# Patient Record
Sex: Female | Born: 1935
Health system: Southern US, Community
[De-identification: ages and names within clinical notes are randomized; demographics above are authoritative.]

## PROBLEM LIST (undated history)

## (undated) ENCOUNTER — Emergency Department (HOSPITAL_COMMUNITY): Payer: Medicaid Other | Source: Home / Self Care

## (undated) DIAGNOSIS — R296 Repeated falls: Secondary | ICD-10-CM

## (undated) DIAGNOSIS — I10 Essential (primary) hypertension: Secondary | ICD-10-CM

## (undated) DIAGNOSIS — E119 Type 2 diabetes mellitus without complications: Secondary | ICD-10-CM

## (undated) DIAGNOSIS — R945 Abnormal results of liver function studies: Secondary | ICD-10-CM

## (undated) DIAGNOSIS — D696 Thrombocytopenia, unspecified: Secondary | ICD-10-CM

## (undated) DIAGNOSIS — F329 Major depressive disorder, single episode, unspecified: Secondary | ICD-10-CM

## (undated) DIAGNOSIS — F0390 Unspecified dementia without behavioral disturbance: Secondary | ICD-10-CM

## (undated) DIAGNOSIS — F32A Depression, unspecified: Secondary | ICD-10-CM

## (undated) DIAGNOSIS — N390 Urinary tract infection, site not specified: Secondary | ICD-10-CM

## (undated) DIAGNOSIS — I679 Cerebrovascular disease, unspecified: Secondary | ICD-10-CM

## (undated) DIAGNOSIS — R7989 Other specified abnormal findings of blood chemistry: Secondary | ICD-10-CM

## (undated) HISTORY — PX: ABDOMINAL HYSTERECTOMY: SHX81

## (undated) HISTORY — PX: CHOLECYSTECTOMY: SHX55

---

## 2004-06-21 ENCOUNTER — Ambulatory Visit: Payer: Self-pay | Admitting: *Deleted

## 2005-12-05 ENCOUNTER — Ambulatory Visit: Payer: Self-pay | Admitting: Internal Medicine

## 2005-12-12 ENCOUNTER — Ambulatory Visit (HOSPITAL_COMMUNITY): Admission: RE | Admit: 2005-12-12 | Discharge: 2005-12-12 | Payer: Self-pay | Admitting: Internal Medicine

## 2005-12-17 ENCOUNTER — Ambulatory Visit: Payer: Self-pay | Admitting: Internal Medicine

## 2006-06-18 ENCOUNTER — Ambulatory Visit (HOSPITAL_COMMUNITY): Admission: RE | Admit: 2006-06-18 | Discharge: 2006-06-18 | Payer: Self-pay | Admitting: Internal Medicine

## 2006-06-18 ENCOUNTER — Ambulatory Visit: Payer: Self-pay | Admitting: Internal Medicine

## 2006-08-27 ENCOUNTER — Encounter: Payer: Self-pay | Admitting: Internal Medicine

## 2006-08-27 DIAGNOSIS — R7989 Other specified abnormal findings of blood chemistry: Secondary | ICD-10-CM | POA: Insufficient documentation

## 2006-08-27 DIAGNOSIS — E039 Hypothyroidism, unspecified: Secondary | ICD-10-CM

## 2006-08-27 DIAGNOSIS — F411 Generalized anxiety disorder: Secondary | ICD-10-CM | POA: Insufficient documentation

## 2006-08-27 DIAGNOSIS — K589 Irritable bowel syndrome without diarrhea: Secondary | ICD-10-CM | POA: Insufficient documentation

## 2006-08-27 DIAGNOSIS — F329 Major depressive disorder, single episode, unspecified: Secondary | ICD-10-CM

## 2006-08-27 DIAGNOSIS — R197 Diarrhea, unspecified: Secondary | ICD-10-CM | POA: Insufficient documentation

## 2011-06-10 ENCOUNTER — Emergency Department (HOSPITAL_COMMUNITY)
Admission: EM | Admit: 2011-06-10 | Discharge: 2011-06-10 | Disposition: A | Discharge status: Home / Self Care | Payer: Medicare Other | Attending: Emergency Medicine | Admitting: Emergency Medicine

## 2011-06-10 ENCOUNTER — Encounter: Payer: Self-pay | Admitting: Emergency Medicine

## 2011-06-10 DIAGNOSIS — Z9079 Acquired absence of other genital organ(s): Secondary | ICD-10-CM | POA: Insufficient documentation

## 2011-06-10 DIAGNOSIS — E232 Diabetes insipidus: Secondary | ICD-10-CM

## 2011-06-10 HISTORY — DX: Major depressive disorder, single episode, unspecified: F32.9

## 2011-06-10 HISTORY — DX: Unspecified dementia, unspecified severity, without behavioral disturbance, psychotic disturbance, mood disturbance, and anxiety: F03.90

## 2011-06-10 HISTORY — DX: Depression, unspecified: F32.A

## 2011-06-10 LAB — CBC
MCH: 32.8 pg (ref 26.0–34.0)
MCV: 95.3 fL (ref 78.0–100.0)
Platelets: 92 10*3/uL — ABNORMAL LOW (ref 150–400)
RDW: 13.1 % (ref 11.5–15.5)
WBC: 3.4 10*3/uL — ABNORMAL LOW (ref 4.0–10.5)

## 2011-06-10 LAB — COMPREHENSIVE METABOLIC PANEL
Albumin: 3.4 g/dL — ABNORMAL LOW (ref 3.5–5.2)
CO2: 28 mEq/L (ref 19–32)
Calcium: 9.2 mg/dL (ref 8.4–10.5)
Chloride: 102 mEq/L (ref 96–112)
Creatinine, Ser: 0.49 mg/dL — ABNORMAL LOW (ref 0.50–1.10)

## 2011-06-10 MED ORDER — METFORMIN HCL 1000 MG PO TABS: 1000.0000 mg | ORAL_TABLET | Freq: Two times a day (BID) | ORAL | Status: DC

## 2011-06-10 MED ORDER — BACITRACIN ZINC 500 UNIT/GM EX OINT
TOPICAL_OINTMENT | CUTANEOUS | Status: AC
  Administered 2011-06-10: 16:00:00
  Filled 2011-06-10: qty 0.9

## 2011-06-10 NOTE — ED Notes (Signed)
Pt has a wound on her rt lower leg. Pt does not know how long that has been there. Pt also has smaller abrasions on bilateral legs.

## 2011-06-10 NOTE — ED Provider Notes (Addendum)
History     CSN: [id number] Arrival date & time: 06/10/2011  1:02 PM   Chief Complaint  Patient presents with  . wound on rt leg      (Include location/radiation/quality/duration/timing/severity/associated sxs/prior treatment) HPI Elderly white female brought in by her daughter this afternoon because of concern about possible infection in her lower extremity. Patient's daughter noted today that she had some reddened areas on her right shoulder and left shin more prominently on the right which looks like they might be secondary to scratches or a skin infection. She also has concerns that the patient does not followup with a physician and she may be getting unsafe to stay at home. The patient does not want any assisted living and will sustain her home as long as possible. According to the daughter the patient is taking care of her self. She has someone come by and help with her food. She is concerning that she is becoming more and more dependent and less and less active. Patient denies any fever or pain at the present time. Patient currently has no private physician has not been seen by a caregiver in quite some time according to the daughter.  Past Medical History  Diagnosis Date  . Depression   . Dementia      Past Surgical History  Procedure Date  . Abdominal hysterectomy     History reviewed. No pertinent family history.  History  Substance Use Topics  . Smoking status: Not on file  . Smokeless tobacco: Not on file  . Alcohol Use: No    OB History    Grav Para Term Preterm Abortions TAB SAB Ect Mult Living                  Review of Systems  All other systems reviewed and are negative.    Allergies  Review of patient's allergies indicates no known allergies.  Home Medications  No current outpatient prescriptions on file.  Physical Exam    BP 147/58  Pulse 74  Temp(Src) 98.5 F (36.9 C) (Oral)  Resp 17  SpO2 96%  Physical Exam  Constitutional: She  appears well-developed and well-nourished.  HENT:  Head: Atraumatic.  Neck: Normal range of motion. Neck supple.  Cardiovascular: Normal rate and regular rhythm.   Pulmonary/Chest: Effort normal.  Abdominal: Soft. Bowel sounds are normal.  Skin: Skin is warm and dry. Abrasion noted.   HEENT: Normal Neck: Supple with no meningeal signs.     ED Course  Procedures  Results for orders placed during the hospital encounter of 06/10/11  COMPREHENSIVE METABOLIC PANEL      Component Value Range   Sodium 136  135 - 145 (mEq/L)   Potassium 4.0  3.5 - 5.1 (mEq/L)   Chloride 102  96 - 112 (mEq/L)   CO2 28  19 - 32 (mEq/L)   Glucose, Bld 269 (*) 70 - 99 (mg/dL)   BUN 9  6 - 23 (mg/dL)   Creatinine, Ser 1.19 (*) 0.50 - 1.10 (mg/dL)   Calcium 9.2  8.4 - 14.7 (mg/dL)   Total Protein 6.2  6.0 - 8.3 (g/dL)   Albumin 3.4 (*) 3.5 - 5.2 (g/dL)   AST 27  0 - 37 (U/L)   ALT 18  0 - 35 (U/L)   Alkaline Phosphatase 91  39 - 117 (U/L)   Total Bilirubin 1.1  0.3 - 1.2 (mg/dL)   GFR calc non Af Amer >60  >60 (mL/min)   GFR calc Af  Amer >60  >60 (mL/min)   No results found.   No diagnosis found.   MDM        Nelia Shi, MD 06/10/11 1612  Nelia Shi, MD 07/15/11 2312

## 2011-06-10 NOTE — ED Notes (Signed)
Social workers number provided for further resources.  Dr Radford Pax  Explained to POA home health visit set up to check on pt in her home. Metformin rx provided. POA instructed in care of diabetes and resources provided to obtain more information in regards to this disease process.

## 2011-06-10 NOTE — ED Notes (Signed)
Pt lives alone, daughter is concerned for elderly parent's health, safety and general well being.  States "mom has not seen a doctor in years and throws a fit when insisted she sees one" . Dr Radford Pax at bedside during discussion of long term needs for "mom".  Wound on rt leg requires basic wound care. Wound appears to be an abrasion with skin tear/laceration in center, no drainage or bleeding noted. Pt has what appears to be open bug bites on both extremities.  No swelling/edema in lower extremities.

## 2011-06-10 NOTE — ED Notes (Signed)
New contact  Phone #  For daughter is Work # [phone]  Cell # [phone].  [name] POA

## 2011-07-02 ENCOUNTER — Emergency Department (HOSPITAL_COMMUNITY): Payer: Medicare Other

## 2011-07-02 ENCOUNTER — Inpatient Hospital Stay (HOSPITAL_COMMUNITY)
Admission: EM | Admit: 2011-07-02 | Discharge: 2011-07-05 | DRG: 690 | Disposition: A | Discharge status: Skilled Nursing Facility | Payer: Medicare Other | Attending: Internal Medicine | Admitting: Internal Medicine

## 2011-07-02 ENCOUNTER — Encounter (HOSPITAL_COMMUNITY): Payer: Self-pay

## 2011-07-02 DIAGNOSIS — E039 Hypothyroidism, unspecified: Secondary | ICD-10-CM

## 2011-07-02 DIAGNOSIS — S2239XA Fracture of one rib, unspecified side, initial encounter for closed fracture: Secondary | ICD-10-CM | POA: Diagnosis present

## 2011-07-02 DIAGNOSIS — R7989 Other specified abnormal findings of blood chemistry: Secondary | ICD-10-CM | POA: Diagnosis present

## 2011-07-02 DIAGNOSIS — W1800XA Striking against unspecified object with subsequent fall, initial encounter: Secondary | ICD-10-CM | POA: Diagnosis present

## 2011-07-02 DIAGNOSIS — R197 Diarrhea, unspecified: Secondary | ICD-10-CM

## 2011-07-02 DIAGNOSIS — M549 Dorsalgia, unspecified: Secondary | ICD-10-CM | POA: Diagnosis present

## 2011-07-02 DIAGNOSIS — K589 Irritable bowel syndrome without diarrhea: Secondary | ICD-10-CM

## 2011-07-02 DIAGNOSIS — Z66 Do not resuscitate: Secondary | ICD-10-CM | POA: Diagnosis present

## 2011-07-02 DIAGNOSIS — E860 Dehydration: Secondary | ICD-10-CM | POA: Diagnosis present

## 2011-07-02 DIAGNOSIS — T148XXA Other injury of unspecified body region, initial encounter: Secondary | ICD-10-CM

## 2011-07-02 DIAGNOSIS — S2231XA Fracture of one rib, right side, initial encounter for closed fracture: Secondary | ICD-10-CM | POA: Diagnosis present

## 2011-07-02 DIAGNOSIS — R410 Disorientation, unspecified: Secondary | ICD-10-CM

## 2011-07-02 DIAGNOSIS — E1151 Type 2 diabetes mellitus with diabetic peripheral angiopathy without gangrene: Secondary | ICD-10-CM | POA: Diagnosis present

## 2011-07-02 DIAGNOSIS — I5189 Other ill-defined heart diseases: Secondary | ICD-10-CM | POA: Diagnosis present

## 2011-07-02 DIAGNOSIS — N390 Urinary tract infection, site not specified: Principal | ICD-10-CM | POA: Diagnosis present

## 2011-07-02 DIAGNOSIS — E119 Type 2 diabetes mellitus without complications: Secondary | ICD-10-CM | POA: Diagnosis present

## 2011-07-02 DIAGNOSIS — D696 Thrombocytopenia, unspecified: Secondary | ICD-10-CM | POA: Diagnosis present

## 2011-07-02 DIAGNOSIS — F411 Generalized anxiety disorder: Secondary | ICD-10-CM

## 2011-07-02 DIAGNOSIS — F0390 Unspecified dementia without behavioral disturbance: Secondary | ICD-10-CM | POA: Diagnosis present

## 2011-07-02 DIAGNOSIS — F329 Major depressive disorder, single episode, unspecified: Secondary | ICD-10-CM

## 2011-07-02 DIAGNOSIS — S20229A Contusion of unspecified back wall of thorax, initial encounter: Secondary | ICD-10-CM | POA: Diagnosis present

## 2011-07-02 DIAGNOSIS — I10 Essential (primary) hypertension: Secondary | ICD-10-CM | POA: Diagnosis present

## 2011-07-02 DIAGNOSIS — A498 Other bacterial infections of unspecified site: Secondary | ICD-10-CM | POA: Diagnosis present

## 2011-07-02 DIAGNOSIS — W19XXXA Unspecified fall, initial encounter: Secondary | ICD-10-CM | POA: Diagnosis present

## 2011-07-02 HISTORY — DX: Type 2 diabetes mellitus without complications: E11.9

## 2011-07-02 HISTORY — DX: Essential (primary) hypertension: I10

## 2011-07-02 LAB — DIFFERENTIAL
Basophils Absolute: 0 10*3/uL (ref 0.0–0.1)
Eosinophils Relative: 0 % (ref 0–5)
Lymphocytes Relative: 10 % — ABNORMAL LOW (ref 12–46)
Lymphs Abs: 0.7 10*3/uL (ref 0.7–4.0)
Monocytes Relative: 4 % (ref 3–12)

## 2011-07-02 LAB — COMPREHENSIVE METABOLIC PANEL
ALT: 39 U/L — ABNORMAL HIGH (ref 0–35)
Chloride: 102 mEq/L (ref 96–112)
Creatinine, Ser: 0.47 mg/dL — ABNORMAL LOW (ref 0.50–1.10)
GFR calc Af Amer: >90 mL/min (ref >90)
Glucose, Bld: 159 mg/dL — ABNORMAL HIGH (ref 70–99)
Sodium: 138 mEq/L (ref 135–145)
Total Protein: 7.1 g/dL (ref 6.0–8.3)

## 2011-07-02 LAB — URINALYSIS, ROUTINE W REFLEX MICROSCOPIC
Bilirubin Urine: NEGATIVE
Nitrite: POSITIVE — AB
pH: 5 (ref 5.0–8.0)

## 2011-07-02 LAB — CBC
Hemoglobin: 14.8 g/dL (ref 12.0–15.0)
MCH: 33 pg (ref 26.0–34.0)
MCHC: 34.7 g/dL (ref 30.0–36.0)
MCV: 95.1 fL (ref 78.0–100.0)
Platelets: 113 10*3/uL — ABNORMAL LOW (ref 150–400)
RBC: 4.48 MIL/uL (ref 3.87–5.11)
WBC: 6.9 10*3/uL (ref 4.0–10.5)

## 2011-07-02 LAB — VITAMIN B12: Vitamin B-12: 412 pg/mL (ref 211–911)

## 2011-07-02 LAB — URINE MICROSCOPIC-ADD ON

## 2011-07-02 LAB — CARDIAC PANEL(CRET KIN+CKTOT+MB+TROPI)
Relative Index: 2 (ref 0.0–2.5)
Total CK: 179 U/L — ABNORMAL HIGH (ref 7–177)

## 2011-07-02 LAB — T4, FREE: Free T4: 1.17 ng/dL (ref 0.80–1.80)

## 2011-07-02 LAB — TSH: TSH: 2.78 u[IU]/mL (ref 0.350–4.500)

## 2011-07-02 LAB — PROTIME-INR
INR: 1.08 (ref 0.00–1.49)
Prothrombin Time: 14.2 seconds (ref 11.6–15.2)

## 2011-07-02 LAB — CK: Total CK: 150 U/L (ref 7–177)

## 2011-07-02 MED ORDER — SODIUM CHLORIDE 0.9 % IV SOLN
INTRAVENOUS | Status: DC
  Administered 2011-07-02: 14:00:00 via INTRAVENOUS

## 2011-07-02 MED ORDER — ALUM & MAG HYDROXIDE-SIMETH 200-200-20 MG/5ML PO SUSP: 30.0000 mL | Freq: Four times a day (QID) | ORAL | Status: DC | PRN

## 2011-07-02 MED ORDER — DEXTROSE 5 % IV SOLN
INTRAVENOUS | Status: AC
  Filled 2011-07-02: qty 1

## 2011-07-02 MED ORDER — SODIUM CHLORIDE 0.9 % IV SOLN: INTRAVENOUS | Status: DC

## 2011-07-02 MED ORDER — GUAIFENESIN-DM 100-10 MG/5ML PO SYRP: 5.0000 mL | ORAL_SOLUTION | ORAL | Status: DC | PRN

## 2011-07-02 MED ORDER — DEXTROSE 5 % IV SOLN
1.0000 g | INTRAVENOUS | Status: DC
  Administered 2011-07-02 – 2011-07-05 (×4): 1 g via INTRAVENOUS
  Filled 2011-07-02 (×5): qty 1

## 2011-07-02 MED ORDER — INSULIN GLARGINE 100 UNIT/ML ~~LOC~~ SOLN
10.0000 [IU] | Freq: Every day | SUBCUTANEOUS | Status: DC
  Administered 2011-07-02 – 2011-07-03 (×2): 10 [IU] via SUBCUTANEOUS
  Filled 2011-07-02: qty 3

## 2011-07-02 MED ORDER — ACETAMINOPHEN 325 MG PO TABS
650.0000 mg | ORAL_TABLET | Freq: Four times a day (QID) | ORAL | Status: DC | PRN
  Administered 2011-07-03 – 2011-07-04 (×2): 650 mg via ORAL
  Administered 2011-07-04 – 2011-07-05 (×3): 325 mg via ORAL
  Filled 2011-07-02: qty 2
  Filled 2011-07-02: qty 1
  Filled 2011-07-02: qty 2
  Filled 2011-07-02: qty 1
  Filled 2011-07-02: qty 2

## 2011-07-02 MED ORDER — PNEUMOCOCCAL VAC POLYVALENT 25 MCG/0.5ML IJ INJ
0.5000 mL | INJECTION | INTRAMUSCULAR | Status: AC
  Administered 2011-07-03: 0.5 mL via INTRAMUSCULAR
  Filled 2011-07-02: qty 0.5

## 2011-07-02 MED ORDER — ONDANSETRON HCL 4 MG PO TABS: 4.0000 mg | ORAL_TABLET | Freq: Four times a day (QID) | ORAL | Status: DC | PRN

## 2011-07-02 MED ORDER — ONDANSETRON HCL 4 MG/2ML IJ SOLN: 4.0000 mg | Freq: Four times a day (QID) | INTRAMUSCULAR | Status: DC | PRN

## 2011-07-02 MED ORDER — INSULIN ASPART 100 UNIT/ML ~~LOC~~ SOLN
0.0000 [IU] | Freq: Three times a day (TID) | SUBCUTANEOUS | Status: DC
  Administered 2011-07-03: 2 [IU] via SUBCUTANEOUS
  Administered 2011-07-03: 5 [IU] via SUBCUTANEOUS
  Administered 2011-07-03 – 2011-07-04 (×2): 2 [IU] via SUBCUTANEOUS
  Administered 2011-07-04: 3 [IU] via SUBCUTANEOUS
  Administered 2011-07-05 (×2): 2 [IU] via SUBCUTANEOUS

## 2011-07-02 MED ORDER — POTASSIUM CHLORIDE IN NACL 20-0.9 MEQ/L-% IV SOLN
INTRAVENOUS | Status: DC
  Administered 2011-07-02 – 2011-07-04 (×2): via INTRAVENOUS
  Administered 2011-07-04: 1000 mL via INTRAVENOUS

## 2011-07-02 MED ORDER — ACETAMINOPHEN 650 MG RE SUPP: 650.0000 mg | Freq: Four times a day (QID) | RECTAL | Status: DC | PRN

## 2011-07-02 MED ORDER — HYDROCODONE-ACETAMINOPHEN 5-325 MG PO TABS: 1.0000 | ORAL_TABLET | ORAL | Status: DC | PRN

## 2011-07-02 MED ORDER — INSULIN ASPART 100 UNIT/ML ~~LOC~~ SOLN
0.0000 [IU] | Freq: Every day | SUBCUTANEOUS | Status: DC
  Filled 2011-07-02: qty 3

## 2011-07-02 MED ORDER — SODIUM CHLORIDE 0.9 % IV BOLUS (SEPSIS)
500.0000 mL | Freq: Once | INTRAVENOUS | Status: AC
  Administered 2011-07-02: 500 mL via INTRAVENOUS

## 2011-07-02 MED ORDER — METFORMIN HCL 500 MG PO TABS
500.0000 mg | ORAL_TABLET | Freq: Every day | ORAL | Status: DC
  Administered 2011-07-02 – 2011-07-05 (×4): 500 mg via ORAL
  Filled 2011-07-02 (×4): qty 1

## 2011-07-02 MED ORDER — ALBUTEROL SULFATE (5 MG/ML) 0.5% IN NEBU: 2.5000 mg | INHALATION_SOLUTION | RESPIRATORY_TRACT | Status: DC | PRN

## 2011-07-02 MED ORDER — LISINOPRIL 10 MG PO TABS
10.0000 mg | ORAL_TABLET | Freq: Every day | ORAL | Status: DC
  Administered 2011-07-02 – 2011-07-05 (×4): 10 mg via ORAL
  Filled 2011-07-02 (×7): qty 1

## 2011-07-02 MED ORDER — MORPHINE SULFATE 2 MG/ML IJ SOLN: 1.0000 mg | INTRAMUSCULAR | Status: DC | PRN

## 2011-07-02 NOTE — ED Provider Notes (Addendum)
History     CSN: [id number] Arrival date & time: 07/02/2011 11:40 AM  Chief Complaint  Patient presents with  . Fall    (Consider location/radiation/quality/duration/timing/severity/associated sxs/prior treatment) Patient is a 77 y.o. female presenting with fall. The history is provided by the patient and a relative. History Limited By: cognitive problems.  Fall Incident onset: Fall occurred sometime since last night at 7 pm.  The fall occurred in unknown circumstances. She landed on a hard floor.  Patient reports that she fell.  She denies syncope and thinks she lost her balance.  Daughter was last there at 7 pm last night.  [employer] came to bring her lunch and couldn't get her to come to door.  Daughter was called and came but had to get help to get her up.  She was able to ambulate a few steps after being assisted up.  She complained of severe back pain.  She currently denies any pain.  Past Medical History  Diagnosis Date  . Depression   . Dementia     Past Surgical History  Procedure Date  . Abdominal hysterectomy     No family history on file.  History  Substance Use Topics  . Smoking status: Not on file  . Smokeless tobacco: Not on file  . Alcohol Use: No    OB History    Grav Para Term Preterm Abortions TAB SAB Ect Mult Living                  Review of Systems  All other systems reviewed and are negative.    Allergies  Review of patient's allergies indicates no known allergies.  Home Medications   Current Outpatient Rx  Name Route Sig Dispense Refill  . LISINOPRIL 10 MG PO TABS Oral Take 10 mg by mouth daily.      Marland Kitchen METFORMIN HCL 1000 MG PO TABS Oral Take 500-1,000 mg by mouth 2 (two) times daily. Patient takes 1000mg  in the morning and 500mg  at night.       BP 152/56  Pulse 76  Temp(Src) 98.3 F (36.8 C) (Oral)  Resp 20  SpO2 97%  Physical Exam  Vitals reviewed. Constitutional: She appears well-developed and well-nourished.  HENT:    Head: Normocephalic and atraumatic.  Right Ear: External ear normal.  Left Ear: External ear normal.  Nose: Nose normal.  Mouth/Throat: Oropharynx is clear and moist.  Eyes: Conjunctivae and EOM are normal. Pupils are equal, round, and reactive to light.  Neck: Normal range of motion. Neck supple.  Cardiovascular: Normal rate.   Pulmonary/Chest: Effort normal and breath sounds normal.       Contusion right posterolateral chest wall with tenderness, no crepitus.  Abdominal: Bowel sounds are normal.  Musculoskeletal: Normal range of motion.       Tender mid lumbar spine and sacrum.  Mild tenderness bilateral hips.  Neurological: She is alert.       Patient not oriented to time or president but to location and name  Skin: Skin is warm and dry.  Psychiatric: She has a normal mood and affect.    ED Course  Procedures (including critical care time)  Labs Reviewed  CBC - Abnormal; Notable for the following:    Platelets 113 (*)    All other components within normal limits  DIFFERENTIAL - Abnormal; Notable for the following:    Neutrophils Relative 87 (*)    Lymphocytes Relative 10 (*)    All other components within normal  limits  URINALYSIS, ROUTINE W REFLEX MICROSCOPIC - Abnormal; Notable for the following:    Specific Gravity, Urine >1.030 (*)    Glucose, UA 100 (*)    Hgb urine dipstick TRACE (*)    Ketones, ur 40 (*)    Nitrite POSITIVE (*)    All other components within normal limits  URINE MICROSCOPIC-ADD ON - Abnormal; Notable for the following:    Squamous Epithelial / LPF FEW (*)    Bacteria, UA FEW (*)    All other components within normal limits  PROTIME-INR  APTT  POCT I-STAT TROPONIN I  I-STAT TROPONIN I  COMPREHENSIVE METABOLIC PANEL  CK   No results found.   No diagnosis found.    MDM   Date: 07/02/2011  Rate: 71  Rhythm: normal sinus rhythm  QRS Axis: left  Intervals: normal  ST/T Wave abnormalities: nonspecific ST changes  Conduction  Disutrbances:left bundle branch block  Narrative Interpretation:   Old EKG Reviewed: none available    Results for orders placed during the hospital encounter of 07/02/11  PROTIME-INR      Component Value Range   Prothrombin Time 14.2  11.6 - 15.2 (seconds)   INR 1.08  0.00 - 1.49   APTT      Component Value Range   aPTT 32  24 - 37 (seconds)  CBC      Component Value Range   WBC 6.9  4.0 - 10.5 (K/uL)   RBC 4.48  3.87 - 5.11 (MIL/uL)   Hemoglobin 14.8  12.0 - 15.0 (g/dL)   HCT 16.1  09.6 - 04.5 (%)   MCV 95.1  78.0 - 100.0 (fL)   MCH 33.0  26.0 - 34.0 (pg)   MCHC 34.7  30.0 - 36.0 (g/dL)   RDW 40.9  81.1 - 91.4 (%)   Platelets 113 (*) 150 - 400 (K/uL)  DIFFERENTIAL      Component Value Range   Neutrophils Relative 87 (*) 43 - 77 (%)   Neutro Abs 6.0  1.7 - 7.7 (K/uL)   Lymphocytes Relative 10 (*) 12 - 46 (%)   Lymphs Abs 0.7  0.7 - 4.0 (K/uL)   Monocytes Relative 4  3 - 12 (%)   Monocytes Absolute 0.3  0.1 - 1.0 (K/uL)   Eosinophils Relative 0  0 - 5 (%)   Eosinophils Absolute 0.0  0.0 - 0.7 (K/uL)   Basophils Relative 0  0 - 1 (%)   Basophils Absolute 0.0  0.0 - 0.1 (K/uL)  COMPREHENSIVE METABOLIC PANEL      Component Value Range   Sodium 138  135 - 145 (mEq/L)   Potassium 4.0  3.5 - 5.1 (mEq/L)   Chloride 102  96 - 112 (mEq/L)   CO2 25  19 - 32 (mEq/L)   Glucose, Bld 159 (*) 70 - 99 (mg/dL)   BUN 12  6 - 23 (mg/dL)   Creatinine, Ser 7.82 (*) 0.50 - 1.10 (mg/dL)   Calcium 9.8  8.4 - 95.6 (mg/dL)   Total Protein 7.1  6.0 - 8.3 (g/dL)   Albumin 4.0  3.5 - 5.2 (g/dL)   AST 56 (*) 0 - 37 (U/L)   ALT 39 (*) 0 - 35 (U/L)   Alkaline Phosphatase 87  39 - 117 (U/L)   Total Bilirubin 1.4 (*) 0.3 - 1.2 (mg/dL)   GFR calc non Af Amer >90  >90 (mL/min)   GFR calc Af Amer >90  >90 (mL/min)  CK  Component Value Range   Total CK 150  7 - 177 (U/L)  URINALYSIS, ROUTINE W REFLEX MICROSCOPIC      Component Value Range   Color, Urine YELLOW  YELLOW    Appearance CLEAR   CLEAR    Specific Gravity, Urine >1.030 (*) 1.005 - 1.030    pH 5.0  5.0 - 8.0    Glucose, UA 100 (*) NEGATIVE (mg/dL)   Hgb urine dipstick TRACE (*) NEGATIVE    Bilirubin Urine NEGATIVE  NEGATIVE    Ketones, ur 40 (*) NEGATIVE (mg/dL)   Protein, ur NEGATIVE  NEGATIVE (mg/dL)   Urobilinogen, UA 0.2  0.0 - 1.0 (mg/dL)   Nitrite POSITIVE (*) NEGATIVE    Leukocytes, UA NEGATIVE  NEGATIVE   POCT I-STAT TROPONIN I      Component Value Range   Troponin i, poc 0.01  0.00 - 0.08 (ng/mL)   Comment 3           URINE MICROSCOPIC-ADD ON      Component Value Range   Squamous Epithelial / LPF FEW (*) RARE    WBC, UA 3-6  <3 (WBC/hpf)   Bacteria, UA FEW (*) RARE    Dg Chest 1 View  07/02/2011  *RADIOLOGY REPORT*  Clinical Data: Fall, pain.  CHEST - 1 VIEW  Comparison: 06/18/2006  Findings: Heart and mediastinal contours are within normal limits. No focal opacities or effusions.  No acute bony abnormality.  IMPRESSION: No active cardiopulmonary disease.  Original Report Authenticated By: Cyndie Chime, M.D.   Dg Lumbar Spine Complete  07/02/2011  *RADIOLOGY REPORT*  Clinical Data: Fall, pain.  LUMBAR SPINE - COMPLETE 4+ VIEW  Comparison: None.  Findings: There is mild osteopenia.  Diffuse degenerative disc disease throughout the lower thoracic and lumbar spine.  Normal alignment.  No fracture.  SI joints are symmetric and unremarkable.  IMPRESSION: Osteopenia.  Spondylosis.  No acute findings.  Original Report Authenticated By: Cyndie Chime, M.D.   Dg Pelvis 1-2 Views  07/02/2011  *RADIOLOGY REPORT*  Clinical Data: Pain, fall.  PELVIS - 1-2 VIEW  Comparison: None.  Findings: Mild osteopenia.  Early degenerative changes in the hips bilaterally.  SI joints are symmetric and unremarkable. No acute bony abnormality.  Specifically, no fracture, subluxation, or dislocation.  Soft tissues are intact.  IMPRESSION: No acute bony abnormality.  Original Report Authenticated By: Cyndie Chime, M.D.   Ct  Head Wo Contrast  07/02/2011  *RADIOLOGY REPORT*  Clinical Data: Found on floor this morning.  Tripped and fell.  CT HEAD WITHOUT CONTRAST  Technique:  Contiguous axial images were obtained from the base of the skull through the vertex without contrast.  Comparison: 12/12/2005  Findings: Bone windows demonstrate no significant soft tissue swelling.  No skull fracture.  Clear paranasal sinuses and mastoid air cells.  Soft tissue windows demonstrate minimal low density in the periventricular white matter likely related to small vessel disease.Expected cerebral atrophy.  This results in prominence of the extraaxial spaces, especially adjacent the frontal lobes.  This is unchanged.  Possible remote lacunar infarct in the anterior limb of the left internal capsule on image 16.  This is unchanged since the prior. No  mass lesion, hemorrhage, hydrocephalus, acute infarct, intra-axial, or extra-axial fluid collection.  IMPRESSION: No acute intracranial abnormality.  Original Report Authenticated By: Consuello Bossier, M.D.     Patient with some difficulty recalling history.  It is unclear regarding the circumstances around the fall if it was mechanical  or loc.  Whe has some wbc in urine and it is being cultured.  Patient's care discussed with Dr. Sherrie Mustache and she will be admitted for further evaluation.  Hilario Quarry, MD 07/02/11 1600  Hilario Quarry, MD 07/02/11 [phone]

## 2011-07-02 NOTE — ED Notes (Signed)
Cicily called, gave report

## 2011-07-02 NOTE — H&P (Signed)
Jennifer Kerr MRN: [id number] DOB/AGE: [birth year] 77 y.o. Primary Care Physician:HALL,ZACK, MD Admit date: 07/02/2011 Chief Complaint: The patient was found down at home. She fell. HPI: The patient is a 77 year old woman with a past medical history significant for hypertension, type 2 diabetes mellitus, and dementia, who presents to the emergency department today after her daughter found her laying on the floor in the bathroom at home. The patient acknowledges that she fell while in the bathroom, however, she is unclear of the circumstances surrounding the fall. She believes she may have fallen early this morning, but she does not completely rule out that she fell last night. She says that she did not hit her head. She denies a loss of consciousness. She believes that she may have tripped on her feet. She has not had any dizziness, headache, shortness of breath, chest pain, nausea, vomiting, diarrhea,, pain, pain with urination, or unusual swelling in her legs. Currently, she had knowledge is that her back hurts. Her daughter, Jennifer Kerr is here and providing the majority of the history. Accordingly, [employer] came to deliver the patient's breakfast this morning. When she did not answer, her daughter was called. When her daughter arrived, she found her mother on the floor alert, but unable to get up. She immediately checked her blood sugar and it was 207. She noticed that the patient had a bruise on her upper back. There was no obvious bruising or bumps or lumps on her head. The home health nurse arrived soon afterwards. She advised her daughter not to move the patient until EMS arrived.  Of note, she was recently started on lisinopril and the dose of metformin was increased to 1500 mg just recently.  In the emergency department, the patient is noted to be hemodynamically stable. She is afebrile. The CT of her head reveals no acute intracranial abnormality. The x-rays of her chest, lumbar spine, and  pelvis, are unremarkable and without fractures. Her urinalysis reveals nitrite, 3-6 WBCs, and few bacteria. Her lab data are significant for a venous glucose of 159 and a platelet count of 113. She is being admitted for further evaluation and management.  Past Medical History  Diagnosis Date  . Depression   . Dementia   . HTN (hypertension) 07/02/2011  . DM type 2 (diabetes mellitus, type 2) 07/02/2011    Past Surgical History  Procedure Date  . Abdominal hysterectomy   - Probable open cholecystectomy based on the scar.  Prior to Admission medications   Medication Sig Start Date End Date Taking? Authorizing Provider  lisinopril (PRINIVIL,ZESTRIL) 10 MG tablet Take 10 mg by mouth daily.     Yes Historical Provider, MD  metFORMIN (GLUCOPHAGE) 1000 MG tablet Take 500-1,000 mg by mouth 2 (two) times daily. Patient takes 1000mg  in the morning and 500mg  at night.  06/10/11 06/09/12 Yes Nelia Shi, MD    Allergies: No Known Allergies  Family history: The patient's mother died of stomach cancer. Her father died of complications of heart disease.  Social History: The patient is widowed. She lives in [place]. She lives in an apartment alone. Her only child, Jennifer Kerr, visits her daily. [name] is her healthcare power of attorney. The patient has no history of tobacco or alcohol use. She is a DO NOT RESUSCITATE per my conversation with [name].     ROS: Positive for memory deficits. Also as above in the history of present illness. Otherwise, review of systems is negative to  PHYSICAL EXAM:  Blood pressure 149/63, pulse 73, temperature 98.8 F (37.1 C), temperature source Oral, resp. rate 20, SpO2 97.00%.  General: The patient is an elderly 77 year old Caucasian woman who is currently lying in bed, in no acute distress. HEENT: Head is normocephalic, nontraumatic. Pupils are equal, round, and reactive to light. Extraocular movements are intact. Conjunctivae are clear and  sclerae are white. Oropharynx reveals mildly dry mucous membranes. No teeth. No posterior exudates or erythema. Neck: Supple, no adenopathy, no bruit, no JVD. Slight thyromegaly. Lungs: Clear to auscultation bilaterally. Heart: S1, S2, with a soft systolic murmur. Abdomen: Well-healed right upper quadrant abdominal scar. Obese. Soft, nontender, nondistended. GU and rectal deferred. Extremities: Pedal pulses barely palpable. No pedal edema. The patient is able to flex and extend all 4 extremities without to much difficulty. No acute hot red joints. Skin: There is a small amount of ecchymosis just medial of her right scapula, moderately tender. Neurologic: The patient is alert and oriented to place, self, and daughter. Cranial nerves II through XII are grossly intact. In the supine position, the patient's upper extremity strength bilaterally is 5 over 5 and her lower extremity strength is 5 over 5. Sensation is grossly intact.   Basic Metabolic Panel:  Basename 07/02/11 1352  NA 138  K 4.0  CL 102  CO2 25  GLUCOSE 159*  BUN 12  CREATININE 0.47*  CALCIUM 9.8  MG --  PHOS --   Liver Function Tests:  Basename 07/02/11 1352  AST 56*  ALT 39*  ALKPHOS 87  BILITOT 1.4*  PROT 7.1  ALBUMIN 4.0   No results found for this basename: LIPASE:2,AMYLASE:2 in the last 72 hours No results found for this basename: AMMONIA:2 in the last 72 hours CBC:  Basename 07/02/11 1352  WBC 6.9  NEUTROABS 6.0  HGB 14.8  HCT 42.6  MCV 95.1  PLT 113*   Cardiac Enzymes:  Basename 07/02/11 1352  CKTOTAL 150  CKMB --  CKMBINDEX --  TROPONINI --   BNP: No results found for this basename: POCBNP:3 in the last 72 hours D-Dimer: No results found for this basename: DDIMER:2 in the last 72 hours CBG: No results found for this basename: GLUCAP:6 in the last 72 hours Hemoglobin A1C: No results found for this basename: HGBA1C in the last 72 hours Fasting Lipid Panel: No results found for this  basename: CHOL,HDL,LDLCALC,TRIG,CHOLHDL,LDLDIRECT in the last 72 hours Thyroid Function Tests: No results found for this basename: TSH,T4TOTAL,FREET4,T3FREE,THYROIDAB in the last 72 hours Anemia Panel: No results found for this basename: VITAMINB12,FOLATE,FERRITIN,TIBC,IRON,RETICCTPCT in the last 72 hours Urine Drug Screen:  Alcohol Level: No results found for this basename: ETH:2 in the last 72 hours Urinalysis: Misc. Labs:  EKG reveals normal sinus rhythm, heart rate 71 beats per minute, left axis deviation, and left bundle branch block.  No results found for this or any previous visit (from the past 240 hour(s)).   Results for orders placed during the hospital encounter of 07/02/11 (from the past 48 hour(s))  PROTIME-INR     Status: Normal   Collection Time   07/02/11  1:52 PM      Component Value Range Comment   Prothrombin Time 14.2  11.6 - 15.2 (seconds)    INR 1.08  0.00 - 1.49    APTT     Status: Normal   Collection Time   07/02/11  1:52 PM      Component Value Range Comment   aPTT 32  24 - 37 (seconds)   CBC  Status: Abnormal   Collection Time   07/02/11  1:52 PM      Component Value Range Comment   WBC 6.9  4.0 - 10.5 (K/uL)    RBC 4.48  3.87 - 5.11 (MIL/uL)    Hemoglobin 14.8  12.0 - 15.0 (g/dL)    HCT 45.4  09.8 - 11.9 (%)    MCV 95.1  78.0 - 100.0 (fL)    MCH 33.0  26.0 - 34.0 (pg)    MCHC 34.7  30.0 - 36.0 (g/dL)    RDW 14.7  82.9 - 56.2 (%)    Platelets 113 (*) 150 - 400 (K/uL)   DIFFERENTIAL     Status: Abnormal   Collection Time   07/02/11  1:52 PM      Component Value Range Comment   Neutrophils Relative 87 (*) 43 - 77 (%)    Neutro Abs 6.0  1.7 - 7.7 (K/uL)    Lymphocytes Relative 10 (*) 12 - 46 (%)    Lymphs Abs 0.7  0.7 - 4.0 (K/uL)    Monocytes Relative 4  3 - 12 (%)    Monocytes Absolute 0.3  0.1 - 1.0 (K/uL)    Eosinophils Relative 0  0 - 5 (%)    Eosinophils Absolute 0.0  0.0 - 0.7 (K/uL)    Basophils Relative 0  0 - 1 (%)    Basophils  Absolute 0.0  0.0 - 0.1 (K/uL)   COMPREHENSIVE METABOLIC PANEL     Status: Abnormal   Collection Time   07/02/11  1:52 PM      Component Value Range Comment   Sodium 138  135 - 145 (mEq/L)    Potassium 4.0  3.5 - 5.1 (mEq/L)    Chloride 102  96 - 112 (mEq/L)    CO2 25  19 - 32 (mEq/L)    Glucose, Bld 159 (*) 70 - 99 (mg/dL)    BUN 12  6 - 23 (mg/dL)    Creatinine, Ser 1.30 (*) 0.50 - 1.10 (mg/dL)    Calcium 9.8  8.4 - 10.5 (mg/dL)    Total Protein 7.1  6.0 - 8.3 (g/dL)    Albumin 4.0  3.5 - 5.2 (g/dL)    AST 56 (*) 0 - 37 (U/L) HEMOLYZED SPECIMEN, RESULTS MAY BE AFFECTED   ALT 39 (*) 0 - 35 (U/L)    Alkaline Phosphatase 87  39 - 117 (U/L)    Total Bilirubin 1.4 (*) 0.3 - 1.2 (mg/dL)    GFR calc non Af Amer >90  >90 (mL/min)    GFR calc Af Amer >90  >90 (mL/min)   CK     Status: Normal   Collection Time   07/02/11  1:52 PM      Component Value Range Comment   Total CK 150  7 - 177 (U/L)   POCT I-STAT TROPONIN I     Status: Normal   Collection Time   07/02/11  1:53 PM      Component Value Range Comment   Troponin i, poc 0.01  0.00 - 0.08 (ng/mL)    Comment 3            URINALYSIS, ROUTINE W REFLEX MICROSCOPIC     Status: Abnormal   Collection Time   07/02/11  1:55 PM      Component Value Range Comment   Color, Urine YELLOW  YELLOW     Appearance CLEAR  CLEAR     Specific Gravity, Urine >1.030 (*)  1.005 - 1.030     pH 5.0  5.0 - 8.0     Glucose, UA 100 (*) NEGATIVE (mg/dL)    Hgb urine dipstick TRACE (*) NEGATIVE     Bilirubin Urine NEGATIVE  NEGATIVE     Ketones, ur 40 (*) NEGATIVE (mg/dL)    Protein, ur NEGATIVE  NEGATIVE (mg/dL)    Urobilinogen, UA 0.2  0.0 - 1.0 (mg/dL)    Nitrite POSITIVE (*) NEGATIVE     Leukocytes, UA NEGATIVE  NEGATIVE    URINE MICROSCOPIC-ADD ON     Status: Abnormal   Collection Time   07/02/11  1:55 PM      Component Value Range Comment   Squamous Epithelial / LPF FEW (*) RARE     WBC, UA 3-6  <3 (WBC/hpf)    Bacteria, UA FEW (*) RARE       Dg Chest 1 View  07/02/2011  *RADIOLOGY REPORT*  Clinical Data: Fall, pain.  CHEST - 1 VIEW  Comparison: 06/18/2006  Findings: Heart and mediastinal contours are within normal limits. No focal opacities or effusions.  No acute bony abnormality.  IMPRESSION: No active cardiopulmonary disease.  Original Report Authenticated By: Cyndie Chime, M.D.   Dg Lumbar Spine Complete  07/02/2011  *RADIOLOGY REPORT*  Clinical Data: Fall, pain.  LUMBAR SPINE - COMPLETE 4+ VIEW  Comparison: None.  Findings: There is mild osteopenia.  Diffuse degenerative disc disease throughout the lower thoracic and lumbar spine.  Normal alignment.  No fracture.  SI joints are symmetric and unremarkable.  IMPRESSION: Osteopenia.  Spondylosis.  No acute findings.  Original Report Authenticated By: Cyndie Chime, M.D.   Dg Pelvis 1-2 Views  07/02/2011  *RADIOLOGY REPORT*  Clinical Data: Pain, fall.  PELVIS - 1-2 VIEW  Comparison: None.  Findings: Mild osteopenia.  Early degenerative changes in the hips bilaterally.  SI joints are symmetric and unremarkable. No acute bony abnormality.  Specifically, no fracture, subluxation, or dislocation.  Soft tissues are intact.  IMPRESSION: No acute bony abnormality.  Original Report Authenticated By: Cyndie Chime, M.D.   Ct Head Wo Contrast  07/02/2011  *RADIOLOGY REPORT*  Clinical Data: Found on floor this morning.  Tripped and fell.  CT HEAD WITHOUT CONTRAST  Technique:  Contiguous axial images were obtained from the base of the skull through the vertex without contrast.  Comparison: 12/12/2005  Findings: Bone windows demonstrate no significant soft tissue swelling.  No skull fracture.  Clear paranasal sinuses and mastoid air cells.  Soft tissue windows demonstrate minimal low density in the periventricular white matter likely related to small vessel disease.Expected cerebral atrophy.  This results in prominence of the extraaxial spaces, especially adjacent the frontal lobes.  This is  unchanged.  Possible remote lacunar infarct in the anterior limb of the left internal capsule on image 16.  This is unchanged since the prior. No  mass lesion, hemorrhage, hydrocephalus, acute infarct, intra-axial, or extra-axial fluid collection.  IMPRESSION: No acute intracranial abnormality.  Original Report Authenticated By: Consuello Bossier, M.D.    Impression:  Active Problems:  Fall against object  UTI (lower urinary tract infection)  HTN (hypertension)  DM type 2 (diabetes mellitus, type 2)  Dementia  Back pain, acute  Thrombocytopenia  1. Fall. The patient was found down at home. She does not recall the circumstances of her fall. It is unclear is she had a syncopal episode or if she just simply slipped and fell. Based on the urinalysis, she is dehydrated.  Presyncope as a consequence of dehydration may be a potential etiology. Also, because of the recent changes in her medication regimen, medication side effect is also a possibility.  She is currently neurologically intact. As a result of the fall, she does have mid back pain. Surprisingly, her total CK is not elevated. It is likely that the patient was not on the floor very long before she was discovered.  Urinary tract infection.  Hypertension. The patient was recently started on lisinopril and her daughter wonders if it contributed to her fall.  Type 2 diabetes mellitus. The patient is treated chronically with metformin. The dose of metformin was increased from 1000 mg in the morning to 1000 mg in the morning and 500 mg in the evening recently. She was not hypoglycemic when she was found this morning.  Thrombocytopenia. The etiology is unknown at this time.  Elevated liver transaminases. Her abdomen is benign on exam. This will be followed.  Chronic dementia. [name], her daughter, desires long term placement for the patient as she is becoming more forgetful and her safety is at risk with her living at home alone. [name], however,  does visit her on a daily basis but she feels that the patient now needs 100% supervision.   Plan:  1. Will start IV fluid hydration. We'll start Rocephin for the presumed urinary tract infection. Will decrease the metformin to 500 mg a day and add sliding scale NovoLog for now. We will check a hemoglobin A1c. For further evaluation, we will order cardiac enzymes, vitamin B12, folate,  thyroid function tests, carotid ultrasound, and 2-D echocardiogram. We will also check orthostatic vital signs. We'll consult the clinical social worker for evaluation for placement. We'll consult the physical therapist for evaluation and management. The patient is a DO NOT RESUSCITATE status per my conversation with her daughter.         Silvester Reierson 07/02/2011, 5:47 PM

## 2011-07-02 NOTE — ED Notes (Signed)
Daughter here at this time. Daughter states " Mom has [employer] Whe[employer] that brings her food. Mom did not answer door this morning when they came so they called me. We found mom on the floor." Daughter states she is unsure exactly how long pt has been on floor since pt lives alone. Daughter states " Dr. Margo Aye is in process of trying to get mom is long term placement." pt a/o x3. nad noted.

## 2011-07-02 NOTE — ED Notes (Signed)
Called to give report to [facility], she to call me back

## 2011-07-02 NOTE — ED Notes (Signed)
Pt was at home and walking and "my right ankle rolled over and I fell" denies any dizziness or cp. Home health nurse found pt lying in floor. Pt denies hitting head or any loc. Pt c/o lower back pain. A/o x3. nad noted. resp even/nonlabored.

## 2011-07-03 ENCOUNTER — Inpatient Hospital Stay (HOSPITAL_COMMUNITY): Payer: Medicare Other

## 2011-07-03 DIAGNOSIS — I369 Nonrheumatic tricuspid valve disorder, unspecified: Secondary | ICD-10-CM

## 2011-07-03 LAB — GLUCOSE, CAPILLARY
Glucose-Capillary: 127 mg/dL — ABNORMAL HIGH (ref 70–99)
Glucose-Capillary: 157 mg/dL — ABNORMAL HIGH (ref 70–99)
Glucose-Capillary: 168 mg/dL — ABNORMAL HIGH (ref 70–99)

## 2011-07-03 LAB — HEMOGLOBIN A1C
Hgb A1c MFr Bld: 7.5 % — ABNORMAL HIGH (ref <5.7)
Mean Plasma Glucose: 169 mg/dL — ABNORMAL HIGH (ref <117)

## 2011-07-03 LAB — COMPREHENSIVE METABOLIC PANEL
ALT: 30 U/L (ref 0–35)
AST: 37 U/L (ref 0–37)
Albumin: 3.5 g/dL (ref 3.5–5.2)
Alkaline Phosphatase: 78 U/L (ref 39–117)
Chloride: 105 mEq/L (ref 96–112)
Potassium: 4 mEq/L (ref 3.5–5.1)
Sodium: 140 mEq/L (ref 135–145)
Total Bilirubin: 1.1 mg/dL (ref 0.3–1.2)
Total Protein: 6.1 g/dL (ref 6.0–8.3)

## 2011-07-03 LAB — FOLATE RBC: RBC Folate: 251 ng/mL — ABNORMAL LOW (ref >=366)

## 2011-07-03 LAB — CBC
MCH: 33.2 pg (ref 26.0–34.0)
Platelets: 97 10*3/uL — ABNORMAL LOW (ref 150–400)
RBC: 3.83 MIL/uL — ABNORMAL LOW (ref 3.87–5.11)
WBC: 4 10*3/uL (ref 4.0–10.5)

## 2011-07-03 LAB — CARDIAC PANEL(CRET KIN+CKTOT+MB+TROPI)
Relative Index: 1.7 (ref 0.0–2.5)
Troponin I: <0.3 ng/mL (ref <0.30)

## 2011-07-03 NOTE — Plan of Care (Signed)
Problem: Phase II Progression Outcomes Goal: Progress activity as tolerated unless otherwise ordered Outcome: Completed/Met Date Met:  07/03/11 See PT evaluation and progress notes for details.

## 2011-07-03 NOTE — Progress Notes (Signed)
Subjective: The patient is currently lying in bed. She says that the back soreness is not quite as bad as it was yesterday. She has no other complaints.  Objective: Vital signs in last 24 hours: Filed Vitals:   07/02/11 2022 07/02/11 2023 07/02/11 2027 07/03/11 0502  BP: 129/62 140/73 130/74 150/69  Pulse: 78 88 99 74  Temp: 98.8 F (37.1 C)   98.8 F (37.1 C)  TempSrc: Oral   Oral  Resp: 18   18  Height:      Weight:      SpO2: 97%   98%    Intake/Output Summary (Last 24 hours) at 07/03/11 0926 Last data filed at 07/03/11 0900  Gross per 24 hour  Intake    498 ml  Output    350 ml  Net    148 ml    Weight change:   General: The patient is in no acute distress. Lungs: Clear to auscultation bilaterally. Heart: S1, S2, with a soft systolic murmur. Abdomen: Positive bowel sounds, obese, nontender, nondistended. Extremities: Pedal pulses palpable bilaterally. No pedal edema. Skin: There is a small area of ecchymosis just medial to the right scapula, mildly to moderately tender. Neuro: Alert and oriented to herself, place, and daughter.  Lab Results: Basic Metabolic Panel:  Basename 07/03/11 0443 07/02/11 1352  NA 140 138  K 4.0 4.0  CL 105 102  CO2 25 25  GLUCOSE 113* 159*  BUN 9 12  CREATININE 0.53 0.47*  CALCIUM 9.0 9.8  MG -- --  PHOS -- --   Liver Function Tests:  [facility] 07/03/11 0443 07/02/11 1352  AST 37 56*  ALT 30 39*  ALKPHOS 78 87  BILITOT 1.1 1.4*  PROT 6.1 7.1  ALBUMIN 3.5 4.0   No results found for this basename: LIPASE:2,AMYLASE:2 in the last 72 hours No results found for this basename: AMMONIA:2 in the last 72 hours CBC:  Basename 07/03/11 0443 07/02/11 1352  WBC 4.0 6.9  NEUTROABS -- 6.0  HGB 12.7 14.8  HCT 36.5 42.6  MCV 95.3 95.1  PLT 97* 113*   Cardiac Enzymes:  Basename 07/03/11 0749 07/03/11 0016 07/02/11 1808  CKTOTAL 209* 187* 179*  CKMB 3.1 3.2 3.5  CKMBINDEX -- -- --  TROPONINI <0.30 <0.30 <0.30   BNP: No  results found for this basename: POCBNP:3 in the last 72 hours D-Dimer: No results found for this basename: DDIMER:2 in the last 72 hours CBG:  Basename 07/03/11 0735 07/02/11 2038  GLUCAP 127* 178*   Hemoglobin A1C:  Basename 07/02/11 1352  HGBA1C 7.5*   Fasting Lipid Panel: No results found for this basename: CHOL,HDL,LDLCALC,TRIG,CHOLHDL,LDLDIRECT in the last 72 hours Thyroid Function Tests:  Basename 07/02/11 1352  TSH 2.780  T4TOTAL --  FREET4 1.17  T3FREE --  THYROIDAB --   Anemia Panel:  Basename 07/02/11 1352  VITAMINB12 412  FOLATE --  FERRITIN --  TIBC --  IRON --  RETICCTPCT --    Alcohol Level: No results found for this basename: ETH:2 in the last 72 hours    Micro: No results found for this or any previous visit (from the past 240 hour(s)).  Studies/Results: Dg Chest 1 View  07/02/2011  *RADIOLOGY REPORT*  Clinical Data: Fall, pain.  CHEST - 1 VIEW  Comparison: 06/18/2006  Findings: Heart and mediastinal contours are within normal limits. No focal opacities or effusions.  No acute bony abnormality.  IMPRESSION: No active cardiopulmonary disease.  Original Report Authenticated By: Cyndie Chime,  M.D.   Dg Lumbar Spine Complete  07/02/2011  *RADIOLOGY REPORT*  Clinical Data: Fall, pain.  LUMBAR SPINE - COMPLETE 4+ VIEW  Comparison: None.  Findings: There is mild osteopenia.  Diffuse degenerative disc disease throughout the lower thoracic and lumbar spine.  Normal alignment.  No fracture.  SI joints are symmetric and unremarkable.  IMPRESSION: Osteopenia.  Spondylosis.  No acute findings.  Original Report Authenticated By: Cyndie Chime, M.D.   Dg Pelvis 1-2 Views  07/02/2011  *RADIOLOGY REPORT*  Clinical Data: Pain, fall.  PELVIS - 1-2 VIEW  Comparison: None.  Findings: Mild osteopenia.  Early degenerative changes in the hips bilaterally.  SI joints are symmetric and unremarkable. No acute bony abnormality.  Specifically, no fracture, subluxation, or  dislocation.  Soft tissues are intact.  IMPRESSION: No acute bony abnormality.  Original Report Authenticated By: Cyndie Chime, M.D.   Ct Head Wo Contrast  07/02/2011  *RADIOLOGY REPORT*  Clinical Data: Found on floor this morning.  Tripped and fell.  CT HEAD WITHOUT CONTRAST  Technique:  Contiguous axial images were obtained from the base of the skull through the vertex without contrast.  Comparison: 12/12/2005  Findings: Bone windows demonstrate no significant soft tissue swelling.  No skull fracture.  Clear paranasal sinuses and mastoid air cells.  Soft tissue windows demonstrate minimal low density in the periventricular white matter likely related to small vessel disease.Expected cerebral atrophy.  This results in prominence of the extraaxial spaces, especially adjacent the frontal lobes.  This is unchanged.  Possible remote lacunar infarct in the anterior limb of the left internal capsule on image 16.  This is unchanged since the prior. No  mass lesion, hemorrhage, hydrocephalus, acute infarct, intra-axial, or extra-axial fluid collection.  IMPRESSION: No acute intracranial abnormality.  Original Report Authenticated By: Consuello Bossier, M.D.    Medications: I have reviewed the patient's current medications.  Assessment: Active Problems:  Fall against object  UTI (lower urinary tract infection)  HTN (hypertension)  DM type 2 (diabetes mellitus, type 2)  Dementia  Back pain, acute  Thrombocytopenia  Elevated LFTs  1. Fall with resultant bruising/ecchymosis to the mid back. Analgesics have been ordered. The etiology of her fall is not clear. The CT scan of her head reveals no acute abnormalities. Her cardiac enzymes are negative for an acute myocardial infarction. Her thyroid function is within normal limits. Her vitamin B12 is within normal limits. It is likely that the patient may have had a presyncopal episode from dehydration or urinary tract infection or she may have misstepped as she  reports. There is no evidence of rhabdomyolysis.  Urinary tract infection. Rocephin has been started. Urine culture is pending.  Elevated liver transaminases. Now completely resolved.  Thrombocytopenia. The etiology is unknown at this time. She has not been given heparin or Lovenox. Her thyroid function is within normal limits. Her vitamin B12 is within normal limits. RBC folate level is pending. The remainder of her CBC is relatively unremarkable. His will be followed.  Type 2 diabetes mellitus. Her hemoglobin A1c is 7.5. The dose of metformin was decreased secondary to a history of loose stools. Also, the metformin was decreased due to the elevated liver transaminases. Lantus and sliding scale NovoLog have been added.  Hypertension. Lisinopril was started recently. This will be continued.  Chronic dementia. Stable. The patient's daughter, [name], desires nursing facility placement because it is now unsafe for the patient to be at home alone.   Plan:  1. Continue  gentle hydration. Continue antibiotic therapy. Continue monitoring her platelet count.  Physical therapy consultation is pending. Supportive care. Check carotid ultrasound and 2-D echocardiogram results pending. Pursue nursing facility placement.    LOS: 1 day   Shadee Montoya 07/03/2011, 9:26 AM

## 2011-07-03 NOTE — Progress Notes (Signed)
Nutrition Edu Note   DM edu handout provided for pt. See care plan for details.

## 2011-07-03 NOTE — Progress Notes (Addendum)
Physical Therapy Evaluation Patient Details Name: Jennifer Kerr MRN: [id number] DOB: [birth year] Today's Date: 07/03/2011 Time: 10:25-11:17 Charges: 1 EV 1 G   PT Assessment/Plan/Recommendation PT Assessment Clinical Impression Statement: 77 y.o female admitted to [facility] for fall, UTI who presents with decreased strength, balance, activity tolerance, mobility, safety.  She would benefit from skilled PT to maximize functional independence, mobility and safety before goring to SNF for rehab.   PT Recommendation/Assessment: Patient will need skilled PT in the acute care venue PT Problem List: Decreased strength;Decreased activity tolerance;Decreased balance;Decreased mobility;Decreased cognition;Decreased knowledge of use of DME;Decreased safety awareness;Pain PT Therapy Diagnosis : Difficulty walking;Abnormality of gait;Generalized weakness;Acute pain PT Plan PT Frequency: Min 3X/week PT Treatment/Interventions: DME instruction;Gait training;Functional mobility training;Therapeutic exercise;Balance training;Neuromuscular re-education;Cognitive remediation;Patient/family education PT Recommendation Follow Up Recommendations: Skilled nursing facility;24 hour supervision/assistance;Other (comment) (with transition to ALF after SNF for rehab) Equipment Recommended: None recommended by PT;Defer to next venue PT Goals  Acute Rehab PT Goals PT Goal Formulation: With patient/family Time For Goal Achievement: 2 weeks Pt will go Supine/Side to Sit: with modified independence;with HOB not 0 degrees (comment degree) PT Goal: Supine/Side to Sit - Progress: Progressing toward goal Pt will go Sit to Supine/Side: with modified independence;with HOB 0 degrees PT Goal: Sit to Supine/Side - Progress: Other (comment) (no tested today) Pt will Transfer Sit to Stand/Stand to Sit: with supervision PT Transfer Goal: Sit to Stand/Stand to Sit - Progress: Progressing toward goal Pt will Transfer Bed to Chair/Chair to  Bed: with supervision;Other (comment) (with RW) PT Transfer Goal: Bed to Chair/Chair to Bed - Progress: Progressing toward goal Pt will Ambulate: 51 - 150 feet;with supervision;with rolling walker PT Goal: Ambulate - Progress: Progressing toward goal  PT Evaluation Precautions/Restrictions    Prior Functioning  Home Living Type of Home: Apartment Lives With: Alone Receives Help From: Family;Other (Comment) (daughter-comes by and calls) Home Layout: One level Home Access: Stairs to enter Entrance Stairs-Rails: None Entrance Stairs-Number of Steps: 1 Bathroom Shower/Tub: Midwife with back;Straight cane Additional Comments: has had HH aide help give her sponge bath  Prior Function Level of Independence: Needs assistance with homemaking;Other (comment) Able to Take Stairs?:  (meals on wheels- lunch 5 days per week, catered meal 2 night) Driving: No Cognition Cognition Orientation Level: Oriented to person;Oriented to place;Oriented to situation;Disoriented to time;Other (Comment) (did not know year) Sensation/Coordination   Extremity Assessment RLE Assessment RLE Assessment: Exceptions to [facility] RLE Strength RLE Overall Strength: Deficits RLE Overall Strength Comments: grossly 3/5 LLE Assessment LLE Assessment: Exceptions to [facility] LLE Strength LLE Overall Strength: Deficits LLE Overall Strength Comments: grossly 3/5 Mobility (including Balance) Bed Mobility Bed Mobility: Yes Right Sidelying to Sit: 4: Min assist;With rails;HOB elevated (comment degrees) (HOB 30) Right Sidelying to Sit Details (indicate cue type and reason): min assist of trunk to get to upright sitting.   Sitting - Scoot to Edge of Bed: 6: Modified independent (Device/Increase time) (using bilateral arms and rail to get to EOB) Transfers Transfers: Yes Sit to Stand: 4: Min assist;From elevated surface;With upper extremity  assist;From bed Sit to Stand Details (indicate cue type and reason): min assist to weight shift trunk anteriorly over feet and support trunk over weak legs.   Stand to Sit: 4: Min assist;With upper extremity assist;To chair/3-in-1 Stand to Sit Details: min assist to help control descent to sit.  Verbal cues for reaching to armrests.   Ambulation/Gait Ambulation/Gait: Yes Ambulation/Gait Assistance: 4: Min assist Ambulation/Gait  Assistance Details (indicate cue type and reason): min assist to stabilize trunk for balance.  Ambulation Distance (Feet): 20 Feet (times 2) Assistive device: Rolling walker Gait Pattern: Shuffle    Exercise    End of Session PT - End of Session Equipment Utilized During Treatment: Gait belt;Other (comment) (RW) Activity Tolerance: Patient tolerated treatment well Patient left: in chair;with call bell in reach;with family/visitor present;Other (comment) (daughter) Nurse Communication: Other (comment) (spoke with SW re: d/c planning. ) General Behavior During Session: [facility] for tasks performed  Estell Harpin Pager: #[phone] 07/03/2011, 12:15 PM

## 2011-07-03 NOTE — Plan of Care (Signed)
Problem: Phase II Progression Outcomes Goal: Progress activity as tolerated unless otherwise ordered Outcome: Completed/Met Date Met:  07/03/11 See PT evaluation and progress notes for details.    Goal: Discharge plan established Outcome: Progressing PT recommending SNF for rehab with transition to ALF.

## 2011-07-03 NOTE — Progress Notes (Signed)
*  PRELIMINARY RESULTS* Echocardiogram 2D Echocardiogram has been performed.  Conrad Prosperity 07/03/2011, 1:59 PM

## 2011-07-04 ENCOUNTER — Inpatient Hospital Stay (HOSPITAL_COMMUNITY): Payer: Medicare Other

## 2011-07-04 DIAGNOSIS — I5189 Other ill-defined heart diseases: Secondary | ICD-10-CM | POA: Diagnosis present

## 2011-07-04 LAB — CBC
Hemoglobin: 12.7 g/dL (ref 12.0–15.0)
MCH: 33.1 pg (ref 26.0–34.0)
RBC: 3.84 MIL/uL — ABNORMAL LOW (ref 3.87–5.11)
WBC: 3.6 10*3/uL — ABNORMAL LOW (ref 4.0–10.5)

## 2011-07-04 LAB — GLUCOSE, CAPILLARY: Glucose-Capillary: 119 mg/dL — ABNORMAL HIGH (ref 70–99)

## 2011-07-04 LAB — BASIC METABOLIC PANEL
Glucose, Bld: 113 mg/dL — ABNORMAL HIGH (ref 70–99)
Potassium: 3.6 mEq/L (ref 3.5–5.1)
Sodium: 141 mEq/L (ref 135–145)

## 2011-07-04 MED ORDER — ASPIRIN 81 MG PO CHEW
81.0000 mg | CHEWABLE_TABLET | Freq: Every day | ORAL | Status: DC
  Administered 2011-07-04 – 2011-07-05 (×2): 81 mg via ORAL
  Filled 2011-07-04 (×2): qty 1

## 2011-07-04 MED ORDER — HYDROCODONE-ACETAMINOPHEN 5-325 MG PO TABS: 1.0000 | ORAL_TABLET | ORAL | Status: DC | PRN

## 2011-07-04 NOTE — Progress Notes (Signed)
Physical Therapy Treatment Patient Details Name: Jennifer Kerr MRN: [id number] DOB: Jun 10, 1932 Today's Date: 07/04/2011  TIME:1135-1215/ 1 TE 1 GT PT Assessment/Plan  PT - Assessment/Plan Comments on Treatment Session: Pt did not want to participate due to pain of rib cage upon movement;however pt complied and tolerated all therpy well, moving slowly. Bed mobility and standing are effected most due to soreness from fall;during gait pt needed standing rest break at 20';no LOB PT Goals  Acute Rehab PT Goals PT Goal: Supine/Side to Sit - Progress: Progressing toward goal PT Goal: Sit to Supine/Side - Progress: Progressing toward goal PT Transfer Goal: Sit to Stand/Stand to Sit - Progress: Progressing toward goal PT Goal: Ambulate - Progress: Progressing toward goal  PT Treatment Precautions/Restrictions  Restrictions Weight Bearing Restrictions: No Mobility (including Balance) Bed Mobility Supine to Sit: 4: Min assist Supine to Sit Details (indicate cue type and reason): HOB elevated 70 degrees;assistance needed with LEs Sitting - Scoot to Edge of Bed: 6: Modified independent (Device/Increase time) Sit to Supine - Left: 4: Min assist Sit to Supine - Left Details (indicate cue type and reason): assistance needed with LEs  Transfers Sit to Stand: 4: Min assist Sit to Stand Details (indicate cue type and reason): VCs for hand placement and to steady due to pain of right side rib cage Stand to Sit: 5: Supervision Stand to Sit Details: VCs to reach back to help wtih descent Ambulation/Gait Ambulation/Gait: Yes Ambulation/Gait Assistance: 5: Supervision Ambulation/Gait Assistance Details (indicate cue type and reason): RW;Vcs to stand erect Ambulation Distance (Feet): 40 Feet (standing rest break at 20') Assistive device: Rolling walker Gait Pattern: Shuffle;Trunk flexed Gait velocity: slow Stairs: No Wheelchair Mobility Wheelchair Mobility: No    Exercise  Total Joint  Exercises Ankle Circles/Pumps: Both;10 reps Quad Sets: Both;10 reps Gluteal Sets: 10 reps;Both Heel Slides: Both;10 reps Hip ABduction/ADduction: Both;10 reps Long Arc Quad: Both;10 reps General Exercises - Upper Extremity Shoulder Flexion: Both;10 reps Shoulder Horizontal ABduction: Both;10 reps Shoulder Horizontal ADduction: 10 reps;Both General Exercises - Lower Extremity Ankle Circles/Pumps: Both;10 reps Quad Sets: Both;10 reps Gluteal Sets: 10 reps;Both Long Arc Quad: Both;10 reps Heel Slides: Both;10 reps Hip ABduction/ADduction: Both;10 reps Hip Flexion/Marching: Standing;10 reps Shoulder Exercises Shoulder Flexion: Both;10 reps Low Level/ICU Exercises Ankle Circles/Pumps: Both;10 reps Quad Sets: Both;10 reps Hip ABduction/ADduction: Both;10 reps Heel Slides: Both;10 reps Shoulder Flexion: Both;10 reps Amputee Exercises Quad Sets: Both;10 reps Gluteal Sets: 10 reps;Both Hip ABduction/ADduction: Both;10 reps Hip Flexion/Marching: Standing;10 reps Antenatal Exercises Ankle Circles/Pumps: Both;10 reps Quad Sets: Both;10 reps Gluteal Sets: 10 reps;Both Hip ABduction/ADduction: Both;10 reps End of Session PT - End of Session Equipment Utilized During Treatment: Gait belt (RW) Activity Tolerance: Patient limited by pain Patient left: in bed;with call bell in reach;with bed alarm set General Behavior During Session: [facility] for tasks performed Cognition: [facility] for tasks performed  Jennifer Kerr ATKINSO 07/04/2011, 12:27 PM

## 2011-07-04 NOTE — Progress Notes (Signed)
Subjective: The patient continues to complain of right upper back pain that is now radiating to her side. She denies pleurisy.  Objective: Vital signs in last 24 hours: Filed Vitals:   07/03/11 2202 07/03/11 2204 07/04/11 0522 07/04/11 0804  BP: 147/76 136/76 123/72   Pulse: 81 87 70   Temp:   98.9 F (37.2 C)   TempSrc:   Oral   Resp:   18   Height:      Weight:      SpO2:   96% 96%    Intake/Output Summary (Last 24 hours) at 07/04/11 1336 Last data filed at 07/04/11 0800  Gross per 24 hour  Intake   2250 ml  Output   1150 ml  Net   1100 ml    Weight change:   General: The patient is in no acute distress. Lungs: Clear to auscultation bilaterally. Heart: S1, S2, with a soft systolic murmur. Abdomen: Positive bowel sounds, obese, nontender, nondistended. Extremities: Pedal pulses palpable bilaterally. No pedal edema. Skin: There is a small area of ecchymosis just medial to the right scapula, mildly to moderately tender. She is also tender underneath her right breast, over her ribs. Neuro: Alert and oriented to herself, place, and daughter.  Lab Results: Basic Metabolic Panel:  Basename 07/04/11 0548 07/03/11 0443  NA 141 140  K 3.6 4.0  CL 108 105  CO2 24 25  GLUCOSE 113* 113*  BUN 6 9  CREATININE <0.47* 0.53  CALCIUM 8.7 9.0  MG -- --  PHOS -- --   Liver Function Tests:  [facility] 07/03/11 0443 07/02/11 1352  AST 37 56*  ALT 30 39*  ALKPHOS 78 87  BILITOT 1.1 1.4*  PROT 6.1 7.1  ALBUMIN 3.5 4.0   No results found for this basename: LIPASE:2,AMYLASE:2 in the last 72 hours No results found for this basename: AMMONIA:2 in the last 72 hours CBC:  Basename 07/04/11 0548 07/03/11 0443 07/02/11 1352  WBC 3.6* 4.0 --  NEUTROABS -- -- 6.0  HGB 12.7 12.7 --  HCT 36.8 36.5 --  MCV 95.8 95.3 --  PLT 93* 97* --   Cardiac Enzymes:  Basename 07/03/11 0749 07/03/11 0016 07/02/11 1808  CKTOTAL 209* 187* 179*  CKMB 3.1 3.2 3.5  CKMBINDEX -- -- --  TROPONINI  <0.30 <0.30 <0.30   BNP: No results found for this basename: POCBNP:3 in the last 72 hours D-Dimer: No results found for this basename: DDIMER:2 in the last 72 hours CBG:  Basename 07/04/11 1151 07/04/11 0716 07/03/11 2224 07/03/11 2157 07/03/11 1613 07/03/11 1110  GLUCAP 184* 119* 157* 168* 208* 146*   Hemoglobin A1C:  Basename 07/02/11 1352  HGBA1C 7.5*   Fasting Lipid Panel: No results found for this basename: CHOL,HDL,LDLCALC,TRIG,CHOLHDL,LDLDIRECT in the last 72 hours Thyroid Function Tests:  Basename 07/02/11 1352  TSH 2.780  T4TOTAL --  FREET4 1.17  T3FREE --  THYROIDAB --   Anemia Panel:  Basename 07/02/11 1352  VITAMINB12 412  FOLATE --  FERRITIN --  TIBC --  IRON --  RETICCTPCT --    Alcohol Level: No results found for this basename: ETH:2 in the last 72 hours    Micro: Recent Results (from the past 240 hour(s))  URINE CULTURE     Status: Normal (Preliminary result)   Collection Time   07/02/11  1:55 PM      Component Value Range Status Comment   Specimen Description URINE, CLEAN CATCH   Final    Special Requests NONE  Final    Setup Time [id number]   Final    Colony Count >=100,000 COLONIES/ML   Final    Culture ESCHERICHIA COLI   Final    Report Status PENDING   Incomplete     Studies/Results: Dg Chest 1 View  07/02/2011  *RADIOLOGY REPORT*  Clinical Data: Fall, pain.  CHEST - 1 VIEW  Comparison: 06/18/2006  Findings: Heart and mediastinal contours are within normal limits. No focal opacities or effusions.  No acute bony abnormality.  IMPRESSION: No active cardiopulmonary disease.  Original Report Authenticated By: Cyndie Chime, M.D.   Dg Lumbar Spine Complete  07/02/2011  *RADIOLOGY REPORT*  Clinical Data: Fall, pain.  LUMBAR SPINE - COMPLETE 4+ VIEW  Comparison: None.  Findings: There is mild osteopenia.  Diffuse degenerative disc disease throughout the lower thoracic and lumbar spine.  Normal alignment.  No fracture.  SI joints are  symmetric and unremarkable.  IMPRESSION: Osteopenia.  Spondylosis.  No acute findings.  Original Report Authenticated By: Cyndie Chime, M.D.   Dg Pelvis 1-2 Views  07/02/2011  *RADIOLOGY REPORT*  Clinical Data: Pain, fall.  PELVIS - 1-2 VIEW  Comparison: None.  Findings: Mild osteopenia.  Early degenerative changes in the hips bilaterally.  SI joints are symmetric and unremarkable. No acute bony abnormality.  Specifically, no fracture, subluxation, or dislocation.  Soft tissues are intact.  IMPRESSION: No acute bony abnormality.  Original Report Authenticated By: Cyndie Chime, M.D.   Ct Head Wo Contrast  07/02/2011  *RADIOLOGY REPORT*  Clinical Data: Found on floor this morning.  Tripped and fell.  CT HEAD WITHOUT CONTRAST  Technique:  Contiguous axial images were obtained from the base of the skull through the vertex without contrast.  Comparison: 12/12/2005  Findings: Bone windows demonstrate no significant soft tissue swelling.  No skull fracture.  Clear paranasal sinuses and mastoid air cells.  Soft tissue windows demonstrate minimal low density in the periventricular white matter likely related to small vessel disease.Expected cerebral atrophy.  This results in prominence of the extraaxial spaces, especially adjacent the frontal lobes.  This is unchanged.  Possible remote lacunar infarct in the anterior limb of the left internal capsule on image 16.  This is unchanged since the prior. No  mass lesion, hemorrhage, hydrocephalus, acute infarct, intra-axial, or extra-axial fluid collection.  IMPRESSION: No acute intracranial abnormality.  Original Report Authenticated By: Consuello Bossier, M.D.   US Carotid Duplex Bilateral  07/03/2011  *RADIOLOGY REPORT*  Clinical Data: Larey Seat  BILATERAL CAROTID DUPLEX ULTRASOUND  Technique: Wallace Cullens scale imaging, color Doppler and duplex ultrasound was performed of bilateral carotid and vertebral arteries in the neck.  Comparison: None  Criteria:  Quantification of  carotid stenosis is based on velocity parameters that correlate the residual internal carotid diameter with NASCET-based stenosis levels, using the diameter of the distal internal carotid lumen as the denominator for stenosis measurement.  The following velocity measurements were obtained:                   PEAK SYSTOLIC/END DIASTOLIC RIGHT ICA:                        103/17cm/sec CCA:                        87/8cm/sec SYSTOLIC ICA/CCA RATIO:     1.19 DIASTOLIC ICA/CCA RATIO:    2.12 ECA:  136cm/sec  LEFT ICA:                        102/13 cm/sec CCA:                           133/8cm/sec SYSTOLIC ICA/CCA RATIO:     0.77 DIASTOLIC ICA/CCA RATIO:    1.58 ECA:                        123  cm/sec  Findings:  RIGHT CAROTID ARTERY: There is intimal thickening throughout the common carotid artery.  There is mild smooth noncalcified plaque in the carotid bulb.  No high-grade stenosis.  Normal wave forms and color Doppler signal.  RIGHT VERTEBRAL ARTERY:  Normal flow direction and waveform.  LEFT CAROTID ARTERY: Mild intimal thickening in the common carotid artery and bulb without focal plaque accumulation or high-grade stenosis.  Normal wave forms and color Doppler signal.  LEFT VERTEBRAL ARTERY:  Normal flow direction and waveform.  IMPRESSION:  1.  Early right carotid bifurcation plaque resulting in less than 50% diameter stenosis. The exam does not exclude plaque ulceration or embolization.  Continued surveillance recommended.  Original Report Authenticated By: Osa Craver, M.D.    Medications: I have reviewed the patient's current medications.  Assessment: Active Problems:  Fall against object  UTI (lower urinary tract infection)  HTN (hypertension)  DM type 2 (diabetes mellitus, type 2)  Dementia  Back pain, acute  Thrombocytopenia  Elevated LFTs  Diastolic dysfunction  1. Fall with resultant pain and bruising/ecchymosis to the mid back. She also has right-sided chest  wall pain. Analgesics have been ordered. The etiology of her fall is not clear. The CT scan of her head reveals no acute abnormalities. Her cardiac enzymes are negative for an acute myocardial infarction. Her thyroid function is within normal limits. Her vitamin B12 is within normal limits. It is likely that the patient may have had a presyncopal episode from dehydration or urinary tract infection or she may have misstepped as she reports. There is no evidence of rhabdomyolysis.  Urinary tract infection. Rocephin has been started. Urine culture is pending.  Elevated liver transaminases. Now completely resolved.  Thrombocytopenia. The etiology is unknown at this time. She has not been given heparin or Lovenox. Her thyroid function is within normal limits. Her vitamin B12 is within normal limits. RBC folate level is pending. The remainder of her CBC is relatively unremarkable. His will be followed.  Type 2 diabetes mellitus. Her hemoglobin A1c is 7.5. The dose of metformin was decreased secondary to a history of loose stools. Also, the metformin was decreased due to the elevated liver transaminases. Lantus and sliding scale NovoLog have been added.  Hypertension. Lisinopril was started recently. This will be continued.  Cerebrovascular disease.  Chronic dementia. Stable. The patient's daughter, [name], desires nursing facility placement because it is now unsafe for the patient to be at home alone.   Plan:  1. Continue gentle hydration. Continue antibiotic therapy.  Will add a small dose of aspirin and monitor her platelet count. Discharge to skilled nursing facility tomorrow.    LOS: 2 days   Ajooni Karam 07/04/2011, 1:36 PM

## 2011-07-04 NOTE — Consult Note (Signed)
CSW presented bed offers to pt's daughter who chooses [facility].  Facility notified.  Planned d/c for tomorrow per MD.    Karn Cassis

## 2011-07-05 DIAGNOSIS — S2231XA Fracture of one rib, right side, initial encounter for closed fracture: Secondary | ICD-10-CM | POA: Diagnosis present

## 2011-07-05 LAB — URINE CULTURE: Colony Count: >=100000

## 2011-07-05 LAB — GLUCOSE, CAPILLARY: Glucose-Capillary: 126 mg/dL — ABNORMAL HIGH (ref 70–99)

## 2011-07-05 MED ORDER — ASPIRIN EC 81 MG PO TBEC: 81.0000 mg | DELAYED_RELEASE_TABLET | Freq: Every day | ORAL | Status: AC

## 2011-07-05 MED ORDER — INSULIN GLARGINE 100 UNIT/ML ~~LOC~~ SOLN: 15.0000 [IU] | Freq: Every day | SUBCUTANEOUS | Status: DC

## 2011-07-05 MED ORDER — METFORMIN HCL 500 MG PO TABS: 500.0000 mg | ORAL_TABLET | Freq: Every day | ORAL | Status: DC

## 2011-07-05 MED ORDER — ACETAMINOPHEN 325 MG PO TABS: 650.0000 mg | ORAL_TABLET | Freq: Two times a day (BID) | ORAL | Status: AC

## 2011-07-05 MED ORDER — ACETAMINOPHEN 325 MG PO TABS: 650.0000 mg | ORAL_TABLET | Freq: Two times a day (BID) | ORAL | Status: DC

## 2011-07-05 NOTE — Discharge Summary (Signed)
Physician Discharge Summary  Jennifer Kerr MRN: [id number] DOB/AGE: 1932/06/05 77 y.o.  PCP: DR. FANTA (At the SNF)            DR. ZACK HALL (After SNF stay).   Admit date: 07/02/2011 Discharge date: 07/05/2011  Discharge Diagnoses:  1. Fall at home with resultant ninth right rib fracture and back contusion. 2. Escherichia coli urinary tract infection. 3. Thrombocytopenia, etiology not elucidated during the hospitalization. Her vitamin B 12 level was within normal limits. Her thyroid function was within normal limits. 4. Hypertension. 5. Type 2 diabetes mellitus. Her hemoglobin A1c was 7.5. 6. Elevated LFTs/transaminitis. Resolved. 7. Diastolic dysfunction per 2-D echocardiogram on 07/03/2011. 8. Chronic dementia. 9. Cerebrovascular disease. 10. DO NOT RESUSCITATE status.    Current Discharge Medication List    START taking these medications   Details  acetaminophen (TYLENOL) 325 MG tablet Take 2 tablets (650 mg total) by mouth 2 (two) times daily. Qty: 30 tablet    aspirin EC 81 MG tablet Take 1 tablet (81 mg total) by mouth daily.    insulin glargine (LANTUS) 100 UNIT/ML injection Inject 15 Units into the skin at bedtime. Qty: 10 mL, Refills: 12      CONTINUE these medications which have CHANGED   Details  metFORMIN (GLUCOPHAGE) 500 MG tablet Take 1 tablet (500 mg total) by mouth daily with breakfast.      CONTINUE these medications which have NOT CHANGED   Details  lisinopril (PRINIVIL,ZESTRIL) 10 MG tablet Take 10 mg by mouth daily.          Discharge Condition: Improved.  Disposition: Skilled nursing facility.   Consults: None   Significant Diagnostic Studies: Dg Chest 1 View  07/02/2011  *RADIOLOGY REPORT*  Clinical Data: Fall, pain.  CHEST - 1 VIEW  Comparison: 06/18/2006  Findings: Heart and mediastinal contours are within normal limits. No focal opacities or effusions.  No acute bony abnormality.  IMPRESSION: No active cardiopulmonary  disease.  Original Report Authenticated By: Cyndie Chime, M.D.   Dg Ribs Unilateral Right  07/04/2011  *RADIOLOGY REPORT*  Clinical Data: Right posterior lateral rib pain after a fall.  RIGHT RIBS - 2 VIEW  Comparison: Chest x-ray 07/02/2011  Findings: There is a slightly displaced fracture of the posterior lateral aspect of the right ninth rib.  No underlying pneumothorax or pleural effusion.  No other fractures.  IMPRESSION: Slightly displaced fracture of the posterior lateral aspect of the right ninth rib.  Original Report Authenticated By: Gwynn Burly, M.D.   Dg Lumbar Spine Complete  07/02/2011  *RADIOLOGY REPORT*  Clinical Data: Fall, pain.  LUMBAR SPINE - COMPLETE 4+ VIEW  Comparison: None.  Findings: There is mild osteopenia.  Diffuse degenerative disc disease throughout the lower thoracic and lumbar spine.  Normal alignment.  No fracture.  SI joints are symmetric and unremarkable.  IMPRESSION: Osteopenia.  Spondylosis.  No acute findings.  Original Report Authenticated By: Cyndie Chime, M.D.   Dg Pelvis 1-2 Views  07/02/2011  *RADIOLOGY REPORT*  Clinical Data: Pain, fall.  PELVIS - 1-2 VIEW  Comparison: None.  Findings: Mild osteopenia.  Early degenerative changes in the hips bilaterally.  SI joints are symmetric and unremarkable. No acute bony abnormality.  Specifically, no fracture, subluxation, or dislocation.  Soft tissues are intact.  IMPRESSION: No acute bony abnormality.  Original Report Authenticated By: Cyndie Chime, M.D.   Ct Head Wo Contrast  07/02/2011  *RADIOLOGY REPORT*  Clinical Data: Found on floor  this morning.  Tripped and fell.  CT HEAD WITHOUT CONTRAST  Technique:  Contiguous axial images were obtained from the base of the skull through the vertex without contrast.  Comparison: 12/12/2005  Findings: Bone windows demonstrate no significant soft tissue swelling.  No skull fracture.  Clear paranasal sinuses and mastoid air cells.  Soft tissue windows demonstrate  minimal low density in the periventricular white matter likely related to small vessel disease.Expected cerebral atrophy.  This results in prominence of the extraaxial spaces, especially adjacent the frontal lobes.  This is unchanged.  Possible remote lacunar infarct in the anterior limb of the left internal capsule on image 16.  This is unchanged since the prior. No  mass lesion, hemorrhage, hydrocephalus, acute infarct, intra-axial, or extra-axial fluid collection.  IMPRESSION: No acute intracranial abnormality.  Original Report Authenticated By: Consuello Bossier, M.D.   US Carotid Duplex Bilateral  07/03/2011  *RADIOLOGY REPORT*  Clinical Data: Larey Seat  BILATERAL CAROTID DUPLEX ULTRASOUND  Technique: Wallace Cullens scale imaging, color Doppler and duplex ultrasound was performed of bilateral carotid and vertebral arteries in the neck.  Comparison: None  Criteria:  Quantification of carotid stenosis is based on velocity parameters that correlate the residual internal carotid diameter with NASCET-based stenosis levels, using the diameter of the distal internal carotid lumen as the denominator for stenosis measurement.  The following velocity measurements were obtained:                   PEAK SYSTOLIC/END DIASTOLIC RIGHT ICA:                        103/17cm/sec CCA:                        87/8cm/sec SYSTOLIC ICA/CCA RATIO:     1.19 DIASTOLIC ICA/CCA RATIO:    2.12 ECA:                        136cm/sec  LEFT ICA:                        102/13 cm/sec CCA:                           133/8cm/sec SYSTOLIC ICA/CCA RATIO:     0.77 DIASTOLIC ICA/CCA RATIO:    1.58 ECA:                        123  cm/sec  Findings:  RIGHT CAROTID ARTERY: There is intimal thickening throughout the common carotid artery.  There is mild smooth noncalcified plaque in the carotid bulb.  No high-grade stenosis.  Normal wave forms and color Doppler signal.  RIGHT VERTEBRAL ARTERY:  Normal flow direction and waveform.  LEFT CAROTID ARTERY: Mild intimal  thickening in the common carotid artery and bulb without focal plaque accumulation or high-grade stenosis.  Normal wave forms and color Doppler signal.  LEFT VERTEBRAL ARTERY:  Normal flow direction and waveform.  IMPRESSION:  1.  Early right carotid bifurcation plaque resulting in less than 50% diameter stenosis. The exam does not exclude plaque ulceration or embolization.  Continued surveillance recommended.  Original Report Authenticated By: Osa Craver, M.D.   ECHO:  Study Conclusions  - Left ventricle: The cavity size was normal. There was mild focal basal hypertrophy of the septum. Systolic function  was normal. The estimated ejection fraction was in the range of 60% to 65%. Wall motion was normal; there were no regional wall motion abnormalities. Doppler parameters are consistent with abnormal left ventricular relaxation (grade 1 diastolic dysfunction). - Mitral valve: Calcified annulus. Mildly thickened leaflets . Trivial regurgitation. - Left atrium: The atrium was mildly dilated. - Right ventricle: The cavity size was normal with prominent trabeculation. - Tricuspid valve: Mild regurgitation. - Pulmonary arteries: PA peak pressure: 33mm Hg (S). - Pericardium, extracardiac: A prominent pericardial fat pad was present. Transthoracic echocardiography. M-mode, complete 2D, spectral Doppler, and color Doppler. Height: Height: 160cm. Height: 63in. Weight: Weight: 79.8kg. Weight: 175.6lb. Body mass index: BMI: 31.2kg/m^2. Body surface area: BSA: 1.62m^2. Patient status: Inpatient.      Microbiology: Recent Results (from the past 240 hour(s))  URINE CULTURE     Status: Normal   Collection Time   07/02/11  1:55 PM      Component Value Range Status Comment   Specimen Description URINE, CLEAN CATCH   Final    Special Requests NONE   Final    Setup Time [id number]   Final    Colony Count >=100,000 COLONIES/ML   Final    Culture ESCHERICHIA COLI   Final    Report  Status 07/05/2011 FINAL   Final    Organism ID, Bacteria ESCHERICHIA COLI   Final      Labs: Results for orders placed during the hospital encounter of 07/02/11 (from the past 48 hour(s))  GLUCOSE, CAPILLARY     Status: Abnormal   Collection Time   07/03/11 11:10 AM      Component Value Range Comment   Glucose-Capillary 146 (*) 70 - 99 (mg/dL)   GLUCOSE, CAPILLARY     Status: Abnormal   Collection Time   07/03/11  4:13 PM      Component Value Range Comment   Glucose-Capillary 208 (*) 70 - 99 (mg/dL)   GLUCOSE, CAPILLARY     Status: Abnormal   Collection Time   07/03/11  9:57 PM      Component Value Range Comment   Glucose-Capillary 168 (*) 70 - 99 (mg/dL)    Comment 1 Notify RN     GLUCOSE, CAPILLARY     Status: Abnormal   Collection Time   07/03/11 10:24 PM      Component Value Range Comment   Glucose-Capillary 157 (*) 70 - 99 (mg/dL)   BASIC METABOLIC PANEL     Status: Abnormal   Collection Time   07/04/11  5:48 AM      Component Value Range Comment   Sodium 141  135 - 145 (mEq/L)    Potassium 3.6  3.5 - 5.1 (mEq/L)    Chloride 108  96 - 112 (mEq/L)    CO2 24  19 - 32 (mEq/L)    Glucose, Bld 113 (*) 70 - 99 (mg/dL)    BUN 6  6 - 23 (mg/dL)    Creatinine, Ser <8.11 (*) 0.50 - 1.10 (mg/dL)    Calcium 8.7  8.4 - 10.5 (mg/dL)    GFR calc non Af Amer NOT CALCULATED  >90 (mL/min)    GFR calc Af Amer NOT CALCULATED  >90 (mL/min)   CBC     Status: Abnormal   Collection Time   07/04/11  5:48 AM      Component Value Range Comment   WBC 3.6 (*) 4.0 - 10.5 (K/uL)    RBC 3.84 (*) 3.87 - 5.11 (MIL/uL)  Hemoglobin 12.7  12.0 - 15.0 (g/dL)    HCT 16.1  09.6 - 04.5 (%)    MCV 95.8  78.0 - 100.0 (fL)    MCH 33.1  26.0 - 34.0 (pg)    MCHC 34.5  30.0 - 36.0 (g/dL)    RDW 40.9  81.1 - 91.4 (%)    Platelets 93 (*) 150 - 400 (K/uL)   GLUCOSE, CAPILLARY     Status: Abnormal   Collection Time   07/04/11  7:16 AM      Component Value Range Comment   Glucose-Capillary 119 (*) 70 -  99 (mg/dL)    Comment 1 Documented in Chart      Comment 2 Notify RN     GLUCOSE, CAPILLARY     Status: Abnormal   Collection Time   07/04/11 11:51 AM      Component Value Range Comment   Glucose-Capillary 184 (*) 70 - 99 (mg/dL)    Comment 1 Documented in Chart      Comment 2 Notify RN     GLUCOSE, CAPILLARY     Status: Abnormal   Collection Time   07/04/11  4:27 PM      Component Value Range Comment   Glucose-Capillary 122 (*) 70 - 99 (mg/dL)    Comment 1 Notify RN      Comment 2 Documented in Chart     GLUCOSE, CAPILLARY     Status: Abnormal   Collection Time   07/04/11  8:44 PM      Component Value Range Comment   Glucose-Capillary 149 (*) 70 - 99 (mg/dL)   GLUCOSE, CAPILLARY     Status: Abnormal   Collection Time   07/05/11  7:12 AM      Component Value Range Comment   Glucose-Capillary 126 (*) 70 - 99 (mg/dL)    Comment 1 Documented in Chart      Comment 2 Notify RN        HPI : The patient is a 77 year old woman with a past medical history significant for recent diagnosis of hypertension, recent diagnosis of type 2 diabetes mellitus, and dementia, who presented to the emergency department on 07/02/2011 after her daughter found her laying on the floor at home. The patient does not recall how she fell, but she believes she may have tripped and fallen. There was apparently no loss of consciousness. When her daughter arrived, the patient was alert and oriented to herself and her daughter. It was apparent that she fell on her right side, because she complained of right-sided back pain and side pain. There was also a bruise on her right upper back. Her blood sugar was 207 as checked by her daughter. According to her daughter, [name], she had been started on metformin and lisinopril a few days ago.  In the emergency department, the patient was noted to be hemodynamically stable and afebrile. The CT scan of her head revealed no acute intracranial abnormality. The x-rays of her chest,  lumbar spine, and pelvis were all unremarkable and without fractures. Her urinalysis revealed nitrite, 3-6 WBCs, and a few bacteria. Her lab data were significant for a venous glucose of 159 and a platelet count of 113. Her liver transaminases were slightly elevated.  HOSPITAL COURSE: The patient was started on IV fluid for general hydration. She was also started on Rocephin empirically for pyuria, treated as a urinary tract infection. A urine culture was ordered. It eventually grew out greater than 100,000 colonies of Escherichia coli.  The patient did not have any complaints of pain with urination, but under the circumstances, an antibiotic was started. She was continued on lisinopril. However, the dose of metformin was decreased from 1500 mg daily to 500 mg in the morning. The dose of metformin was decreased because the patient had developed loose stools and because her liver transaminases were elevated. Sliding scale NovoLog was given during the hospitalization. Her hemoglobin A1c was noted to be 7.5. It was decided that she would be maintained on daily dosing of metformin but to add daily Lantus upon discharge. The patient's pain was treated with as needed Tylenol. Her daughter, [name], did not want her to have anything stronger, believing that it would worsen her confusion.  For further evaluation, a number of studies were ordered. As indicated above, the CT scan of her head revealed no acute intracranial findings, however, there was obvious cerebrovascular disease. An aspirin was eventually started, however with caution because she did have thrombocytopenia of unknown etiology. The carotid ultrasound revealed no significant ICA stenosis. The 2-D echocardiogram revealed preserved LV function, however, there was evidence of grade 1 diastolic dysfunction. There was no evidence of congestive heart failure during hospitalization. Her vitamin B 12 was within normal limits. Both her TSH and free T4 were within  normal limits. Her cardiac enzymes were such that she ruled out for myocardial infarction.  She continues to have right-sided chest wall/back pain. It was suspected that she may have had a rib fracture. A dedicated rib x-ray was ordered, and it revealed a mildly displaced ninth rib fracture on the right. Both the patient and her daughter were informed of this. She was continued on arthritis strength Tylenol.  The etiology of the patient's thrombocytopenia is unknown. However, her vitamin B 12 level and thyroid function were both within normal limits. On aspirin therapy, her platelet count will need to be monitored in the outpatient setting.  The patient received 4 days of antibiotic treatment with Rocephin. It was discontinued upon discharge. She remained afebrile and did not have a leukocytosis during the hospitalization, and therefore, no further antibiotic therapy was warranted upon discharge.  The patient's liver transaminases normalized.  The physical therapist was consulted for evaluation. She recommended ongoing physical therapy in a skilled environment or with 24-hour per day care. I discussed this with the patient's daughter. She agreed, but more importantly, she wanted her mother placed in a skilled nursing facility or assisted living facility long-term as she believed that the patient is no longer able to live alone independently because of her dementia. CODE STATUS was discussed and the patient was made a DO NOT RESUSCITATE. Of note, [name] is her healthcare power of attorney.      Discharge Exam: Blood pressure 149/73, pulse 80, temperature 98.5 F (36.9 C), temperature source Oral, resp. rate 20, height 5\' 3"  (1.6 m), weight 80.2 kg (176 lb 12.9 oz), SpO2 96.00%. Lungs: Clear to auscultation bilaterally. Heart: S1, S2, with a soft systolic murmur. Abdomen: Positive bowel sounds, soft, obese, nontender, nondistended. Extremities: Trace of pedal edema bilaterally. Back/chest wall:  Ecchymosis over the right upper back with mild to moderate tenderness upon palpation.    Discharge Orders    Future Orders Please Complete By Expires   Diet Carb Modified      Increase activity slowly      Scheduling Instructions:   PHYSICAL THERAPY EVALUATION AND TREATMENT.   Discharge instructions      Comments:   THE PATIENT SHOULD HAVE HER  CAPILLARY BLOOD GLUCOSE CHECKED TWO TIMES DAILY.         Signed: Wendle Kina 07/05/2011, 9:42 AM

## 2011-07-05 NOTE — Consult Note (Signed)
Pt to D/C today to [facility].  CSW spoke with Pt's family and with Debbie at [facility].  Both are in agreement with D/C plan.  Pt to be transported by EMS.  CSW will sign off at this time.

## 2011-07-05 NOTE — Progress Notes (Signed)
Pt discharged to [facility] today per Dr. Sherrie Mustache. Pt's IV site D/C'd and WNL. Pt's VS stable at this time. Pt's discharge paperwork and home medication list gone over with pt's daughter. Pt's daughter verbalized understanding. Report called to Dennie Bible, RN at [facility]. Verbalized understanding. Pt left floor in stable condition via stretcher accompanied by EMS transporters.

## 2011-11-08 DIAGNOSIS — R197 Diarrhea, unspecified: Secondary | ICD-10-CM | POA: Diagnosis not present

## 2011-12-31 DIAGNOSIS — R300 Dysuria: Secondary | ICD-10-CM | POA: Diagnosis not present

## 2012-02-03 DIAGNOSIS — I10 Essential (primary) hypertension: Secondary | ICD-10-CM | POA: Diagnosis not present

## 2012-02-03 DIAGNOSIS — E119 Type 2 diabetes mellitus without complications: Secondary | ICD-10-CM | POA: Diagnosis not present

## 2012-02-03 DIAGNOSIS — R634 Abnormal weight loss: Secondary | ICD-10-CM | POA: Diagnosis not present

## 2012-02-03 DIAGNOSIS — F068 Other specified mental disorders due to known physiological condition: Secondary | ICD-10-CM | POA: Diagnosis not present

## 2012-03-09 ENCOUNTER — Encounter (HOSPITAL_COMMUNITY): Payer: Self-pay | Admitting: Emergency Medicine

## 2012-03-09 DIAGNOSIS — E119 Type 2 diabetes mellitus without complications: Secondary | ICD-10-CM | POA: Insufficient documentation

## 2012-03-09 DIAGNOSIS — R109 Unspecified abdominal pain: Secondary | ICD-10-CM | POA: Diagnosis not present

## 2012-03-09 DIAGNOSIS — K7460 Unspecified cirrhosis of liver: Secondary | ICD-10-CM | POA: Insufficient documentation

## 2012-03-09 DIAGNOSIS — M899 Disorder of bone, unspecified: Secondary | ICD-10-CM | POA: Insufficient documentation

## 2012-03-09 DIAGNOSIS — M25559 Pain in unspecified hip: Secondary | ICD-10-CM | POA: Insufficient documentation

## 2012-03-09 DIAGNOSIS — F0390 Unspecified dementia without behavioral disturbance: Secondary | ICD-10-CM | POA: Insufficient documentation

## 2012-03-09 DIAGNOSIS — K5730 Diverticulosis of large intestine without perforation or abscess without bleeding: Secondary | ICD-10-CM | POA: Insufficient documentation

## 2012-03-09 DIAGNOSIS — I10 Essential (primary) hypertension: Secondary | ICD-10-CM | POA: Insufficient documentation

## 2012-03-09 NOTE — ED Notes (Signed)
Patient c/o left flank pain that started this morning.  Denies any nausea, vomiting or urinary symptoms.

## 2012-03-10 ENCOUNTER — Emergency Department (HOSPITAL_COMMUNITY)
Admission: EM | Admit: 2012-03-10 | Discharge: 2012-03-10 | Disposition: A | Discharge status: Home / Self Care | Payer: Medicare Other | Attending: Emergency Medicine | Admitting: Emergency Medicine

## 2012-03-10 ENCOUNTER — Emergency Department (HOSPITAL_COMMUNITY): Payer: Medicare Other

## 2012-03-10 DIAGNOSIS — E119 Type 2 diabetes mellitus without complications: Secondary | ICD-10-CM | POA: Diagnosis not present

## 2012-03-10 DIAGNOSIS — K7460 Unspecified cirrhosis of liver: Secondary | ICD-10-CM | POA: Diagnosis not present

## 2012-03-10 DIAGNOSIS — I10 Essential (primary) hypertension: Secondary | ICD-10-CM | POA: Diagnosis not present

## 2012-03-10 DIAGNOSIS — F0390 Unspecified dementia without behavioral disturbance: Secondary | ICD-10-CM | POA: Diagnosis not present

## 2012-03-10 DIAGNOSIS — M899 Disorder of bone, unspecified: Secondary | ICD-10-CM | POA: Diagnosis not present

## 2012-03-10 DIAGNOSIS — M25552 Pain in left hip: Secondary | ICD-10-CM

## 2012-03-10 DIAGNOSIS — M25559 Pain in unspecified hip: Secondary | ICD-10-CM | POA: Diagnosis not present

## 2012-03-10 DIAGNOSIS — K5730 Diverticulosis of large intestine without perforation or abscess without bleeding: Secondary | ICD-10-CM | POA: Diagnosis not present

## 2012-03-10 DIAGNOSIS — R109 Unspecified abdominal pain: Secondary | ICD-10-CM | POA: Diagnosis not present

## 2012-03-10 HISTORY — DX: Urinary tract infection, site not specified: N39.0

## 2012-03-10 HISTORY — DX: Thrombocytopenia, unspecified: D69.6

## 2012-03-10 HISTORY — DX: Other specified abnormal findings of blood chemistry: R79.89

## 2012-03-10 HISTORY — DX: Cerebrovascular disease, unspecified: I67.9

## 2012-03-10 HISTORY — DX: Abnormal results of liver function studies: R94.5

## 2012-03-10 LAB — URINALYSIS, ROUTINE W REFLEX MICROSCOPIC
Bilirubin Urine: NEGATIVE
Specific Gravity, Urine: >1.03 — ABNORMAL HIGH (ref 1.005–1.030)
pH: 6 (ref 5.0–8.0)

## 2012-03-10 LAB — URINE MICROSCOPIC-ADD ON

## 2012-03-10 MED ORDER — IBUPROFEN 100 MG/5ML PO SUSP
400.0000 mg | Freq: Once | ORAL | Status: AC
  Administered 2012-03-10: 400 mg via ORAL
  Filled 2012-03-10: qty 20

## 2012-03-10 MED ORDER — TRAMADOL HCL 50 MG PO TABS: 50.0000 mg | ORAL_TABLET | Freq: Four times a day (QID) | ORAL | Status: AC | PRN

## 2012-03-10 NOTE — Discharge Instructions (Signed)
Tramadol tablets What is this medicine? TRAMADOL (TRA ma dole) is a pain reliever. It is used to treat moderate to severe pain in adults. This medicine may be used for other purposes; ask your health care provider or pharmacist if you have questions. What should I tell my health care provider before I take this medicine? They need to know if you have any of these conditions: -brain tumor -depression -drug abuse or addiction -head injury -if you frequently drink alcohol containing drinks -kidney disease or trouble passing urine -liver disease -lung disease, asthma, or breathing problems -seizures or epilepsy -suicidal thoughts, plans, or attempt; a previous suicide attempt by you or a family member -an unusual or allergic reaction to tramadol, codeine, other medicines, foods, dyes, or preservatives -pregnant or trying to get pregnant -breast-feeding How should I use this medicine? Take this medicine by mouth with a full glass of water. Follow the directions on the prescription label. If the medicine upsets your stomach, take it with food or milk. Do not take more medicine than you are told to take. Talk to your pediatrician regarding the use of this medicine in children. Special care may be needed. Overdosage: If you think you have taken too much of this medicine contact a poison control center or emergency room at once. NOTE: This medicine is only for you. Do not share this medicine with others. What if I miss a dose? If you miss a dose, take it as soon as you can. If it is almost time for your next dose, take only that dose. Do not take double or extra doses. What may interact with this medicine? Do not take this medicine with any of the following medications: -MAOIs like Carbex, Eldepryl, Marplan, Nardil, and Parnate This medicine may also interact with the following medications: -alcohol or medicines that contain alcohol -antihistamines -benzodiazepines -bupropion -carbamazepine  or oxcarbazepine -clozapine -cyclobenzaprine -digoxin -furazolidone -linezolid -medicines for depression, anxiety, or psychotic disturbances -medicines for migraine headache like almotriptan, eletriptan, frovatriptan, naratriptan, rizatriptan, sumatriptan, zolmitriptan -medicines for pain like pentazocine, buprenorphine, butorphanol, meperidine, nalbuphine, and propoxyphene -medicines for sleep -muscle relaxants -naltrexone -phenobarbital -phenothiazines like perphenazine, thioridazine, chlorpromazine, mesoridazine, fluphenazine, prochlorperazine, promazine, and trifluoperazine -procarbazine -warfarin This list may not describe all possible interactions. Give your health care provider a list of all the medicines, herbs, non-prescription drugs, or dietary supplements you use. Also tell them if you smoke, drink alcohol, or use illegal drugs. Some items may interact with your medicine. What should I watch for while using this medicine? Tell your doctor or health care professional if your pain does not go away, if it gets worse, or if you have new or a different type of pain. You may develop tolerance to the medicine. Tolerance means that you will need a higher dose of the medicine for pain relief. Tolerance is normal and is expected if you take this medicine for a long time. Do not suddenly stop taking your medicine because you may develop a severe reaction. Your body becomes used to the medicine. This does NOT mean you are addicted. Addiction is a behavior related to getting and using a drug for a non-medical reason. If you have pain, you have a medical reason to take pain medicine. Your doctor will tell you how much medicine to take. If your doctor wants you to stop the medicine, the dose will be slowly lowered over time to avoid any side effects. You may get drowsy or dizzy. Do not drive, use machinery, or do anything   that needs mental alertness until you know how this medicine affects you. Do  not stand or sit up quickly, especially if you are an older patient. This reduces the risk of dizzy or fainting spells. Alcohol can increase or decrease the effects of this medicine. Avoid alcoholic drinks. You may have constipation. Try to have a bowel movement at least every 2 to 3 days. If you do not have a bowel movement for 3 days, call your doctor or health care professional. Your mouth may get dry. Chewing sugarless gum or sucking hard candy, and drinking plenty of water may help. Contact your doctor if the problem does not go away or is severe. What side effects may I notice from receiving this medicine? Side effects that you should report to your doctor or health care professional as soon as possible: -allergic reactions like skin rash, itching or hives, swelling of the face, lips, or tongue -breathing difficulties, wheezing -confusion -itching -light headedness or fainting spells -redness, blistering, peeling or loosening of the skin, including inside the mouth -seizures Side effects that usually do not require medical attention (report to your doctor or health care professional if they continue or are bothersome): -constipation -dizziness -drowsiness -headache -nausea, vomiting This list may not describe all possible side effects. Call your doctor for medical advice about side effects. You may report side effects to FDA at 1-800-FDA-1088. Where should I keep my medicine? Keep out of the reach of children. Store at room temperature between 15 and 30 degrees C (59 and 86 degrees F). Keep container tightly closed. Throw away any unused medicine after the expiration date. NOTE: This sheet is a summary. It may not cover all possible information. If you have questions about this medicine, talk to your doctor, pharmacist, or health care provider.  2012, [employer]. (05/22/2010 11:55:44 AM) 

## 2012-03-10 NOTE — ED Notes (Addendum)
Daughter states that patient returned from CT disoriented to time and place. States that this is not unusual. Patient moans and states her hip or something is hurting. Would like something for pain. Takes pills crushed in applesauce.

## 2012-03-10 NOTE — ED Provider Notes (Signed)
History   Level 5 Caveat - Unable to obtain complete HPI/ROS due to patient's dementia.  This chart was scribed for Dione Booze, MD by Shari Heritage. The patient was seen in room APA02/APA02. Patient's care was started at 2352.     CSN: [id number]  Arrival date & time 03/09/12  2352   First MD Initiated Contact with Patient 03/10/12 0008      Chief Complaint  Patient presents with  . Flank Pain    (Consider location/radiation/quality/duration/timing/severity/associated sxs/prior treatment) HPI Jennifer Kerr is a 78 y.o. female who presents to the Emergency Department complaining of moderate to severe pain in left sacral area. Patient denies any modifying factors. Patient denies injury or fall. Patient denies nausea, vomiting, dysuria, hematuria, urgency or frequency of urination. Patient is a resident of [address]. Patient with h/o dementia, UTI, diabetes, depression, HTN, thrombocytopenia, CVD. Patient with surgical h/o abdominal hysterectomy and cholecystectomy.  Past Medical History  Diagnosis Date  . Depression   . Dementia   . HTN (hypertension) 07/02/2011  . DM type 2 (diabetes mellitus, type 2) 07/02/2011  . UTI (lower urinary tract infection)   . Thrombocytopenia   . Elevated LFTs   . Cerebrovascular disease     Past Surgical History  Procedure Date  . Abdominal hysterectomy   . Cholecystectomy     Family History  Problem Relation Age of Onset  . Cancer Mother   . Heart disease Father     History  Substance Use Topics  . Smoking status: Never Smoker   . Smokeless tobacco: Never Used  . Alcohol Use: No    OB History    Grav Para Term Preterm Abortions TAB SAB Ect Mult Living                  Review of Systems  Unable to perform ROS   Allergies  Review of patient's allergies indicates no known allergies.  Home Medications   Current Outpatient Rx  Name Route Sig Dispense Refill  . ASPIRIN EC 81 MG PO TBEC Oral Take 1 tablet (81 mg total) by  mouth daily.    . INSULIN GLARGINE 100 UNIT/ML [place] SOLN Subcutaneous Inject 15 Units into the skin at bedtime. 10 mL 12  . LISINOPRIL 10 MG PO TABS Oral Take 10 mg by mouth daily.      Marland Kitchen METFORMIN HCL 500 MG PO TABS Oral Take 1 tablet (500 mg total) by mouth daily with breakfast.      BP 123/63  Pulse 62  Temp 98.3 F (36.8 C) (Oral)  Resp 20  SpO2 95%  Physical Exam  Nursing note and vitals reviewed. Constitutional: She is oriented to person, place, and time. She appears well-developed and well-nourished.  HENT:  Head: Normocephalic and atraumatic.  Eyes: Conjunctivae and EOM are normal. Pupils are equal, round, and reactive to light.  Neck: Normal range of motion. Neck supple.  Cardiovascular: Normal rate and regular rhythm.   Pulmonary/Chest: Effort normal and breath sounds normal.  Abdominal: Soft. Bowel sounds are normal.  Musculoskeletal: Normal range of motion.       Tender over posterior aspect of the left hip. Full ROM present without obvious pain.  Neurological: She is alert and oriented to person, place, and time.       Oriented to person and place, but not time.  Skin: Skin is warm and dry.  Psychiatric: She has a normal mood and affect.    ED Course  Procedures (including critical  care time) DIAGNOSTIC STUDIES: Oxygen Saturation is 95% on room air, adequate by my interpretation.    COORDINATION OF CARE: 12:13AM- Patient informed of current plan for treatment and evaluation and agrees with plan at this time.     Results for orders placed during the hospital encounter of 03/10/12  URINALYSIS, ROUTINE W REFLEX MICROSCOPIC      Component Value Range   Color, Urine YELLOW  YELLOW   APPearance CLEAR  CLEAR   Specific Gravity, Urine >1.030 (*) 1.005 - 1.030   pH 6.0  5.0 - 8.0   Glucose, UA NEGATIVE  NEGATIVE mg/dL   Hgb urine dipstick SMALL (*) NEGATIVE   Bilirubin Urine NEGATIVE  NEGATIVE   Ketones, ur TRACE (*) NEGATIVE mg/dL   Protein, ur NEGATIVE   NEGATIVE mg/dL   Urobilinogen, UA 0.2  0.0 - 1.0 mg/dL   Nitrite NEGATIVE  NEGATIVE   Leukocytes, UA TRACE (*) NEGATIVE  URINE MICROSCOPIC-ADD ON      Component Value Range   Squamous Epithelial / LPF FEW (*) RARE   WBC, UA 3-6  <3 WBC/hpf   RBC / HPF 11-20  <3 RBC/hpf   Bacteria, UA RARE  RARE   Urine-Other MUCOUS PRESENT     Ct Abdomen Pelvis Wo Contrast  03/10/2012  *RADIOLOGY REPORT*  Clinical Data: Left-sided flank pain.  CT ABDOMEN AND PELVIS WITHOUT CONTRAST  Technique:  Multidetector CT imaging of the abdomen and pelvis was performed following the standard protocol without intravenous contrast.  Comparison: Lumbar spine and pelvic radiographs performed 07/02/2011  Findings: Minimal bibasilar atelectasis is noted.  Scattered coronary artery calcifications are noted.  Mild nodularity of the liver likely reflects mild cirrhotic change. The liver is otherwise unremarkable in appearance.  No dominant hepatic mass is seen.  The spleen is somewhat bulky but remains normal in size.  The patient is status post cholecystectomy, with clips noted at the gallbladder fossa.  The pancreas and adrenal glands are unremarkable in appearance.  The kidneys are unremarkable in appearance.  There is no evidence of hydronephrosis.  No renal or ureteral stones are seen.  Mild nonspecific perinephric stranding is noted bilaterally.  Postoperative change is noted at the right mid abdomen along the anterior abdominal wall, and along the midline upper pelvis.  No free fluid is identified.  The small bowel is unremarkable in appearance.  The stomach is within normal limits.  No acute vascular abnormalities are seen.  Scattered calcification is noted along the abdominal aorta and its branches.  The appendix is not definitely seen; there is no evidence for appendicitis.  Minimal diverticulosis is noted along the sigmoid colon; there is no evidence for diverticulitis.  The colon is otherwise unremarkable in appearance.  The  bladder is mildly distended and grossly unremarkable in appearance; minimal air within the bladder may reflect recent Foley catheter placement.  The patient is status post hysterectomy; the ovaries are relatively symmetric, aside from a few small right ovarian cysts, most likely physiologic in nature.  No inguinal lymphadenopathy is seen.  No acute osseous abnormalities are identified.  IMPRESSION:  1.  No acute abnormalities seen within the abdomen or pelvis. 2.  Minimal air within the bladder may reflect recent Foley catheter placement; suggest clinical correlation.  The bladder is otherwise unremarkable in appearance.  3.  Mild hepatic cirrhosis noted; no dominant hepatic mass seen. 4.  Scattered coronary artery calcifications seen. 5.  Minimal diverticulosis along the sigmoid colon, without evidence of diverticulitis. 6.  Scattered  calcification along the abdominal aorta and its branches.  Original Report Authenticated By: Tonia Ghent, M.D.   Dg Hip Complete Left  03/10/2012  *RADIOLOGY REPORT*  Clinical Data: Left hip pain.  LEFT HIP - COMPLETE 2+ VIEW  Comparison:  None.  Findings:  There is no evidence of hip fracture or dislocation. There is no evidence of arthropathy or other focal bone abnormality.Generalized osteopenia is noted.  IMPRESSION: 1.  No acute findings. 2.  Osteopenia.  Original Report Authenticated By: Danae Orleans, M.D.      1. Pain in left hip       MDM  Pain in the left sacral area and hip. X-rays will be obtained to rule out occult fracture and urinalysis obtained to rule out occult UTI.  X-rays are negative. Urinalysis is positive for hematuria. CT will be obtained to make sure she does not have a ureteral calculus.  CT is negative. She will be treated symptomatically for pain, prescription is given for tramadol.      I personally performed the services described in this documentation, which was scribed in my presence. The recorded information has been reviewed  and considered.      Dione Booze, MD 03/10/12 [phone]

## 2012-03-16 DIAGNOSIS — F29 Unspecified psychosis not due to a substance or known physiological condition: Secondary | ICD-10-CM | POA: Diagnosis not present

## 2012-03-16 DIAGNOSIS — N390 Urinary tract infection, site not specified: Secondary | ICD-10-CM | POA: Diagnosis not present

## 2012-04-01 DIAGNOSIS — N390 Urinary tract infection, site not specified: Secondary | ICD-10-CM | POA: Diagnosis not present

## 2012-04-05 DIAGNOSIS — F068 Other specified mental disorders due to known physiological condition: Secondary | ICD-10-CM | POA: Diagnosis not present

## 2012-04-05 DIAGNOSIS — R634 Abnormal weight loss: Secondary | ICD-10-CM | POA: Diagnosis not present

## 2012-04-05 DIAGNOSIS — I10 Essential (primary) hypertension: Secondary | ICD-10-CM | POA: Diagnosis not present

## 2012-05-18 ENCOUNTER — Ambulatory Visit (HOSPITAL_COMMUNITY)
Admission: RE | Admit: 2012-05-18 | Discharge: 2012-05-18 | Disposition: A | Discharge status: Home / Self Care | Payer: Medicare Other | Source: Ambulatory Visit | Attending: Internal Medicine | Admitting: Internal Medicine

## 2012-05-18 ENCOUNTER — Other Ambulatory Visit (HOSPITAL_COMMUNITY): Payer: Self-pay | Admitting: Internal Medicine

## 2012-05-18 DIAGNOSIS — M25559 Pain in unspecified hip: Secondary | ICD-10-CM | POA: Diagnosis not present

## 2012-05-18 DIAGNOSIS — M79659 Pain in unspecified thigh: Secondary | ICD-10-CM

## 2012-05-18 DIAGNOSIS — I10 Essential (primary) hypertension: Secondary | ICD-10-CM | POA: Diagnosis not present

## 2012-05-18 DIAGNOSIS — E119 Type 2 diabetes mellitus without complications: Secondary | ICD-10-CM | POA: Diagnosis not present

## 2012-05-18 DIAGNOSIS — M79609 Pain in unspecified limb: Secondary | ICD-10-CM | POA: Insufficient documentation

## 2012-05-18 DIAGNOSIS — IMO0001 Reserved for inherently not codable concepts without codable children: Secondary | ICD-10-CM | POA: Diagnosis not present

## 2012-05-18 DIAGNOSIS — F068 Other specified mental disorders due to known physiological condition: Secondary | ICD-10-CM | POA: Diagnosis not present

## 2012-07-06 DIAGNOSIS — Z23 Encounter for immunization: Secondary | ICD-10-CM | POA: Diagnosis not present

## 2012-08-24 DIAGNOSIS — F068 Other specified mental disorders due to known physiological condition: Secondary | ICD-10-CM | POA: Diagnosis not present

## 2012-08-24 DIAGNOSIS — E119 Type 2 diabetes mellitus without complications: Secondary | ICD-10-CM | POA: Diagnosis not present

## 2012-08-24 DIAGNOSIS — L259 Unspecified contact dermatitis, unspecified cause: Secondary | ICD-10-CM | POA: Diagnosis not present

## 2012-09-28 DIAGNOSIS — J069 Acute upper respiratory infection, unspecified: Secondary | ICD-10-CM | POA: Diagnosis not present

## 2012-10-10 DIAGNOSIS — N390 Urinary tract infection, site not specified: Secondary | ICD-10-CM | POA: Diagnosis not present

## 2012-10-10 DIAGNOSIS — F29 Unspecified psychosis not due to a substance or known physiological condition: Secondary | ICD-10-CM | POA: Diagnosis not present

## 2012-10-12 DIAGNOSIS — F068 Other specified mental disorders due to known physiological condition: Secondary | ICD-10-CM | POA: Diagnosis not present

## 2012-10-12 DIAGNOSIS — N390 Urinary tract infection, site not specified: Secondary | ICD-10-CM | POA: Diagnosis not present

## 2012-10-21 DIAGNOSIS — N390 Urinary tract infection, site not specified: Secondary | ICD-10-CM | POA: Diagnosis not present

## 2012-10-22 ENCOUNTER — Emergency Department (HOSPITAL_COMMUNITY): Payer: Medicare Other

## 2012-10-22 ENCOUNTER — Emergency Department (HOSPITAL_COMMUNITY)
Admission: EM | Admit: 2012-10-22 | Discharge: 2012-10-22 | Disposition: A | Discharge status: Home / Self Care | Payer: Medicare Other | Attending: Emergency Medicine | Admitting: Emergency Medicine

## 2012-10-22 ENCOUNTER — Encounter (HOSPITAL_COMMUNITY): Payer: Self-pay | Admitting: *Deleted

## 2012-10-22 DIAGNOSIS — Y9389 Activity, other specified: Secondary | ICD-10-CM | POA: Insufficient documentation

## 2012-10-22 DIAGNOSIS — Z7982 Long term (current) use of aspirin: Secondary | ICD-10-CM | POA: Diagnosis not present

## 2012-10-22 DIAGNOSIS — Z23 Encounter for immunization: Secondary | ICD-10-CM | POA: Diagnosis not present

## 2012-10-22 DIAGNOSIS — Z8673 Personal history of transient ischemic attack (TIA), and cerebral infarction without residual deficits: Secondary | ICD-10-CM | POA: Insufficient documentation

## 2012-10-22 DIAGNOSIS — F0390 Unspecified dementia without behavioral disturbance: Secondary | ICD-10-CM | POA: Insufficient documentation

## 2012-10-22 DIAGNOSIS — E119 Type 2 diabetes mellitus without complications: Secondary | ICD-10-CM | POA: Insufficient documentation

## 2012-10-22 DIAGNOSIS — S1093XA Contusion of unspecified part of neck, initial encounter: Secondary | ICD-10-CM | POA: Diagnosis not present

## 2012-10-22 DIAGNOSIS — F329 Major depressive disorder, single episode, unspecified: Secondary | ICD-10-CM | POA: Diagnosis not present

## 2012-10-22 DIAGNOSIS — S0100XA Unspecified open wound of scalp, initial encounter: Secondary | ICD-10-CM | POA: Diagnosis not present

## 2012-10-22 DIAGNOSIS — Y9289 Other specified places as the place of occurrence of the external cause: Secondary | ICD-10-CM | POA: Insufficient documentation

## 2012-10-22 DIAGNOSIS — Z9181 History of falling: Secondary | ICD-10-CM | POA: Diagnosis not present

## 2012-10-22 DIAGNOSIS — S0003XA Contusion of scalp, initial encounter: Secondary | ICD-10-CM | POA: Diagnosis not present

## 2012-10-22 DIAGNOSIS — N390 Urinary tract infection, site not specified: Secondary | ICD-10-CM | POA: Diagnosis not present

## 2012-10-22 DIAGNOSIS — S0180XA Unspecified open wound of other part of head, initial encounter: Secondary | ICD-10-CM | POA: Insufficient documentation

## 2012-10-22 DIAGNOSIS — S0181XA Laceration without foreign body of other part of head, initial encounter: Secondary | ICD-10-CM

## 2012-10-22 DIAGNOSIS — F3289 Other specified depressive episodes: Secondary | ICD-10-CM | POA: Insufficient documentation

## 2012-10-22 DIAGNOSIS — I10 Essential (primary) hypertension: Secondary | ICD-10-CM | POA: Diagnosis not present

## 2012-10-22 DIAGNOSIS — W19XXXA Unspecified fall, initial encounter: Secondary | ICD-10-CM

## 2012-10-22 DIAGNOSIS — S0083XA Contusion of other part of head, initial encounter: Secondary | ICD-10-CM

## 2012-10-22 DIAGNOSIS — Z87440 Personal history of urinary (tract) infections: Secondary | ICD-10-CM | POA: Insufficient documentation

## 2012-10-22 DIAGNOSIS — S060X9A Concussion with loss of consciousness of unspecified duration, initial encounter: Secondary | ICD-10-CM | POA: Diagnosis not present

## 2012-10-22 DIAGNOSIS — T1490XA Injury, unspecified, initial encounter: Secondary | ICD-10-CM | POA: Diagnosis not present

## 2012-10-22 DIAGNOSIS — W010XXA Fall on same level from slipping, tripping and stumbling without subsequent striking against object, initial encounter: Secondary | ICD-10-CM | POA: Insufficient documentation

## 2012-10-22 DIAGNOSIS — Z862 Personal history of diseases of the blood and blood-forming organs and certain disorders involving the immune mechanism: Secondary | ICD-10-CM | POA: Insufficient documentation

## 2012-10-22 MED ORDER — TETANUS-DIPHTH-ACELL PERTUSSIS 5-2.5-18.5 LF-MCG/0.5 IM SUSP
0.5000 mL | Freq: Once | INTRAMUSCULAR | Status: AC
  Administered 2012-10-22: 0.5 mL via INTRAMUSCULAR
  Filled 2012-10-22: qty 0.5

## 2012-10-22 MED ORDER — LIDOCAINE-EPINEPHRINE (PF) 1 %-1:200000 IJ SOLN
INTRAMUSCULAR | Status: AC
  Administered 2012-10-22: 15:00:00
  Filled 2012-10-22: qty 10

## 2012-10-22 NOTE — ED Notes (Addendum)
2 Sutures placed in left temple area by MD

## 2012-10-22 NOTE — ED Notes (Signed)
Pt is from [facility]. Unwitnessed fall. Found lying on floof with head against the shower. LSB. PT denies pain at this time.

## 2012-10-22 NOTE — ED Provider Notes (Signed)
History     CSN: [id number]  Arrival date & time 10/22/12  1312   First MD Initiated Contact with Patient 10/22/12 1316      Chief Complaint  Patient presents with  . Fall  . LSB     (Consider location/radiation/quality/duration/timing/severity/associated sxs/prior treatment) Patient is a 79 y.o. female presenting with fall. The history is provided by the patient.  Fall Pertinent negatives include no fever, no numbness and no abdominal pain.  pt s/p fall at [facility] in bathroom, hx falls. Pt denies loc. States thinks she tripped. Denies faintness, lightheadedness, or dizziness. Hit head. Small lac to left face. Tetanus unknown. Denies neck or back pain. No nv. Headache, dull, frontal. Denies cp or sob. No palpitations. No abd pain. No nvd. No gu c/o. Denies extremity pain or injury.    Past Medical History  Diagnosis Date  . Depression   . Dementia   . HTN (hypertension) 07/02/2011  . DM type 2 (diabetes mellitus, type 2) 07/02/2011  . UTI (lower urinary tract infection)   . Thrombocytopenia   . Elevated LFTs   . Cerebrovascular disease     Past Surgical History  Procedure Date  . Abdominal hysterectomy   . Cholecystectomy     Family History  Problem Relation Age of Onset  . Cancer Mother   . Heart disease Father     History  Substance Use Topics  . Smoking status: Never Smoker   . Smokeless tobacco: Never Used  . Alcohol Use: No    OB History    Grav Para Term Preterm Abortions TAB SAB Ect Mult Living                  Review of Systems  Constitutional: Negative for fever and chills.  HENT: Negative for neck pain.   Eyes: Negative for redness and visual disturbance.  Respiratory: Negative for shortness of breath.   Cardiovascular: Negative for chest pain and palpitations.  Gastrointestinal: Negative for abdominal pain.  Genitourinary: Negative for flank pain.  Musculoskeletal: Negative for back pain.  Skin: Positive for wound.  Neurological: Negative  for weakness and numbness.  Hematological: Does not bruise/bleed easily.  Psychiatric/Behavioral: Negative for confusion.    Allergies  Review of patient's allergies indicates no known allergies.  Home Medications   Current Outpatient Rx  Name  Route  Sig  Dispense  Refill  . ASPIRIN EC 81 MG PO TBEC   Oral   Take 81 mg by mouth daily.         . DONEPEZIL HCL 5 MG PO TABS   Oral   Take 5 mg by mouth at bedtime.         . GUAIFENESIN-DM 100-10 MG/5ML PO SYRP   Oral   Take 5 mLs by mouth 3 (three) times daily as needed.         Marland Kitchen LISINOPRIL 20 MG PO TABS   Oral   Take 20 mg by mouth daily.         Marland Kitchen LORAZEPAM 0.5 MG PO TABS   Oral   Take 0.5 mg by mouth every 8 (eight) hours as needed. Agitation         . MEGESTROL ACETATE 40 MG PO TABS   Oral   Take 40 mg by mouth daily.         Marland Kitchen DAILY VITE PO   Oral   Take 1 tablet by mouth daily.         Marland Kitchen VITAMIN C  500 MG PO TABS   Oral   Take 1,000 mg by mouth daily.         Marland Kitchen ZINC SULFATE 220 MG PO CAPS   Oral   Take 220 mg by mouth daily.           BP 131/50  Pulse 66  Temp 97.9 F (36.6 C) (Oral)  Resp 18  SpO2 100%  Physical Exam  Nursing note and vitals reviewed. Constitutional: She appears well-developed and well-nourished. No distress.  HENT:  Nose: Nose normal.  Mouth/Throat: Oropharynx is clear and moist.       1 cm laceration to left face.   Eyes: Conjunctivae normal and EOM are normal. Pupils are equal, round, and reactive to light. No scleral icterus.  Neck: Neck supple. No tracheal deviation present.  Cardiovascular: Normal rate, regular rhythm, normal heart sounds and intact distal pulses.   Pulmonary/Chest: Effort normal and breath sounds normal. No respiratory distress. She exhibits no tenderness.  Abdominal: Soft. Normal appearance and bowel sounds are normal. She exhibits no distension. There is no tenderness.  Musculoskeletal: Normal range of motion. She exhibits no edema  and no tenderness.       CTLS spine, non tender, aligned, no step off. Good rom bil ext, no focal bony tenderness noted.   Neurological: She is alert.       Alert, oriented. Motor intact bil.   Skin: Skin is warm and dry. No rash noted. She is not diaphoretic.  Psychiatric: She has a normal mood and affect.    ED Course  Procedures (including critical care time)  Ct Head Wo Contrast  10/22/2012  *RADIOLOGY REPORT*  Clinical Data: Post fall hitting left side of forehead on floor, now with altered level of consciousness  CT HEAD WITHOUT CONTRAST  Technique:  Contiguous axial images were obtained from the base of the skull through the vertex without contrast.  Comparison: 07/02/2011; 12/12/2005  Findings: Grossly unchanged dated and diffuse cerebral and cerebellar atrophy with corresponding ex vacuo dilatation of the ventricular system and prominence of the bifrontal extra-axial spaces.  Grossly unchanged scattered periventricular hypodensities. Unchanged lacunar infarct within the left insular cortex.  Given background parenchymal abnormalities, there is no CT evidence of acute large territory infarct.  No intraparenchymal or extra-axial mass or hemorrhage.  Unchanged size and configuration of the ventricles and basilar cisterns.  No midline shift. Minimal mucosal thickening within the bilateral maxillary sinuses.  Remaining paranasal sinuses and mastoid air cells are normally aerated.  No air fluid levels.  Regional soft tissues are normal.  No displaced calvarial fracture.  IMPRESSION:  Microvascular ischemic disease and age advanced atrophy without acute intracranial process.   Original Report Authenticated By: Tacey Ruiz, MD       MDM  Ct.  Tetanus unknown, tetanus im.  Recheck spine nt.  Pt denies neck or back pain.  Reviewed nursing notes and prior charts for additional history.   Recheck spine nt.   Mental status c/w baseline per family.  States is supposed to walk w  walker.  LACERATION REPAIR Performed by: Suzi Roots Authorized by: Suzi Roots Consent: Verbal consent obtained. Risks and benefits: risks, benefits and alternatives were discussed Consent given by: patient Patient identity confirmed: provided demographic data Prepped and Draped in normal sterile fashion Wound explored  Laceration Location: left face  Laceration Length:  1 cm  No Foreign Bodies seen or palpated  Anesthesia: local infiltration  Local anesthetic: lidocaine 2% w epinephrine  Anesthetic  total: 3 ml  Irrigation method: syringe Amount of cleaning: standard  Skin closure: 6-0 nylon  Number of sutures: 2  Technique: simple interrupted.  Patient tolerance: Patient tolerated the procedure well with no immediate complications.   Recheck pt comfortable, in no apparent pain or discomfort.       Suzi Roots, MD 10/22/12 [phone]

## 2012-10-22 NOTE — ED Notes (Signed)
[employer] [facility] contacted and advised they will be sending transport for patient

## 2012-10-26 DIAGNOSIS — R079 Chest pain, unspecified: Secondary | ICD-10-CM | POA: Diagnosis not present

## 2013-01-06 DIAGNOSIS — L280 Lichen simplex chronicus: Secondary | ICD-10-CM | POA: Diagnosis not present

## 2013-02-17 IMAGING — CR DG LUMBAR SPINE COMPLETE 4+V
5 series · 5 of 5 positions shown · non-contrast
Comparison: None.

CLINICAL DATA: Fall, pain.

LUMBAR SPINE - COMPLETE 4+ VIEW

[view not recorded (1 of 5)]
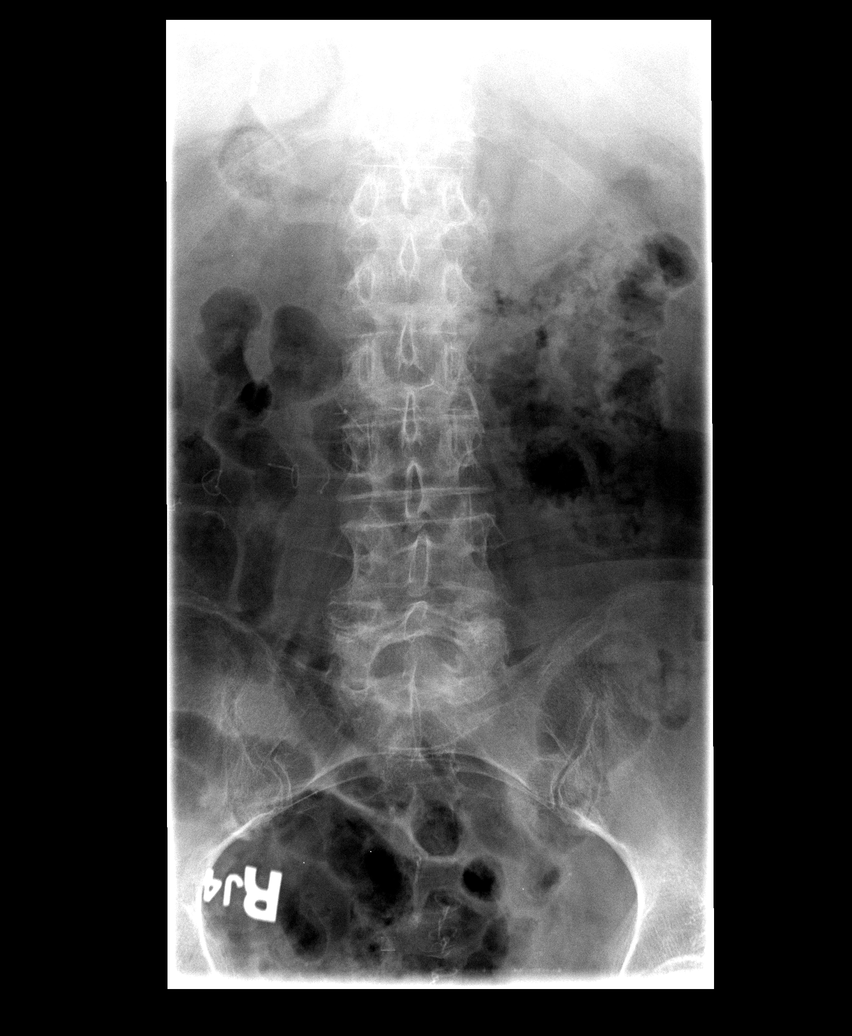

[view not recorded (2 of 5)]
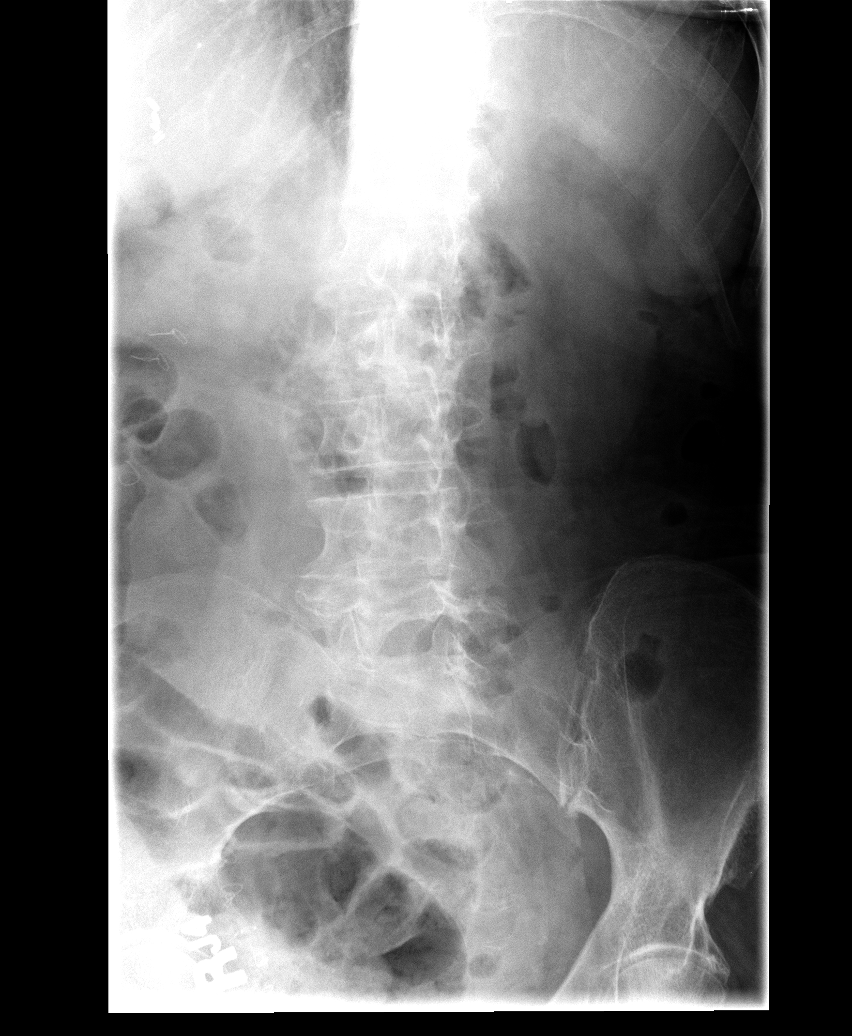

[view not recorded (3 of 5)]
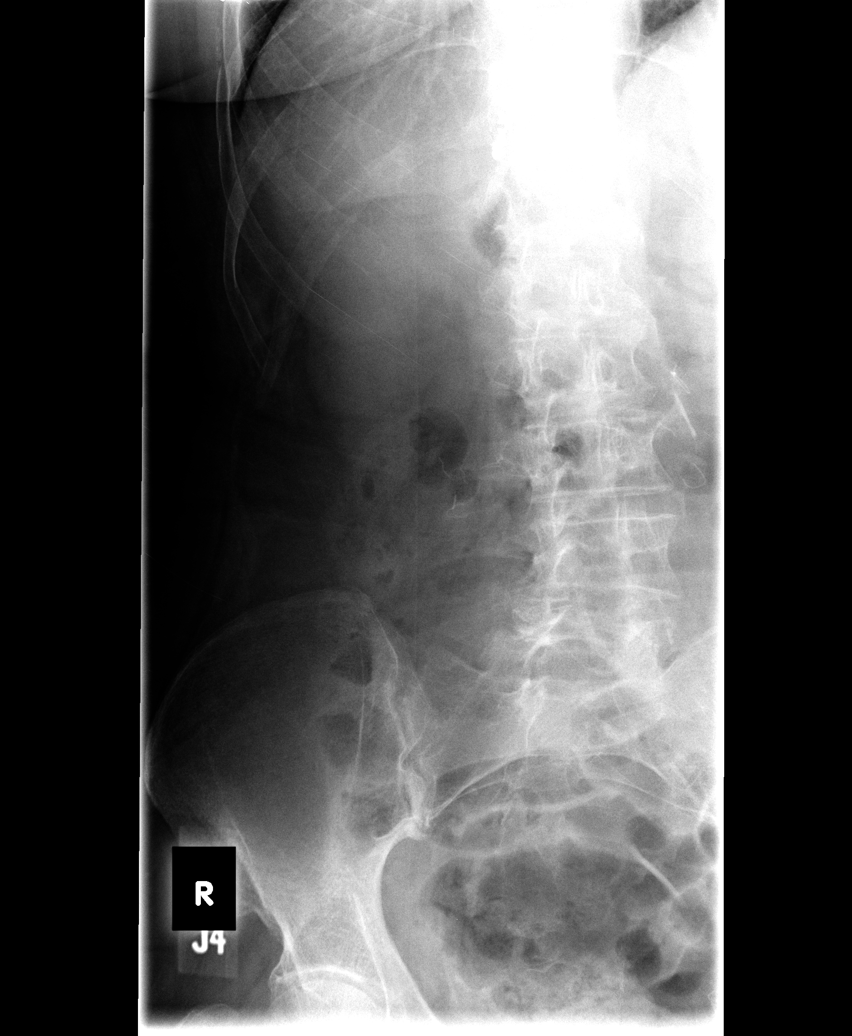

[view not recorded (4 of 5)]
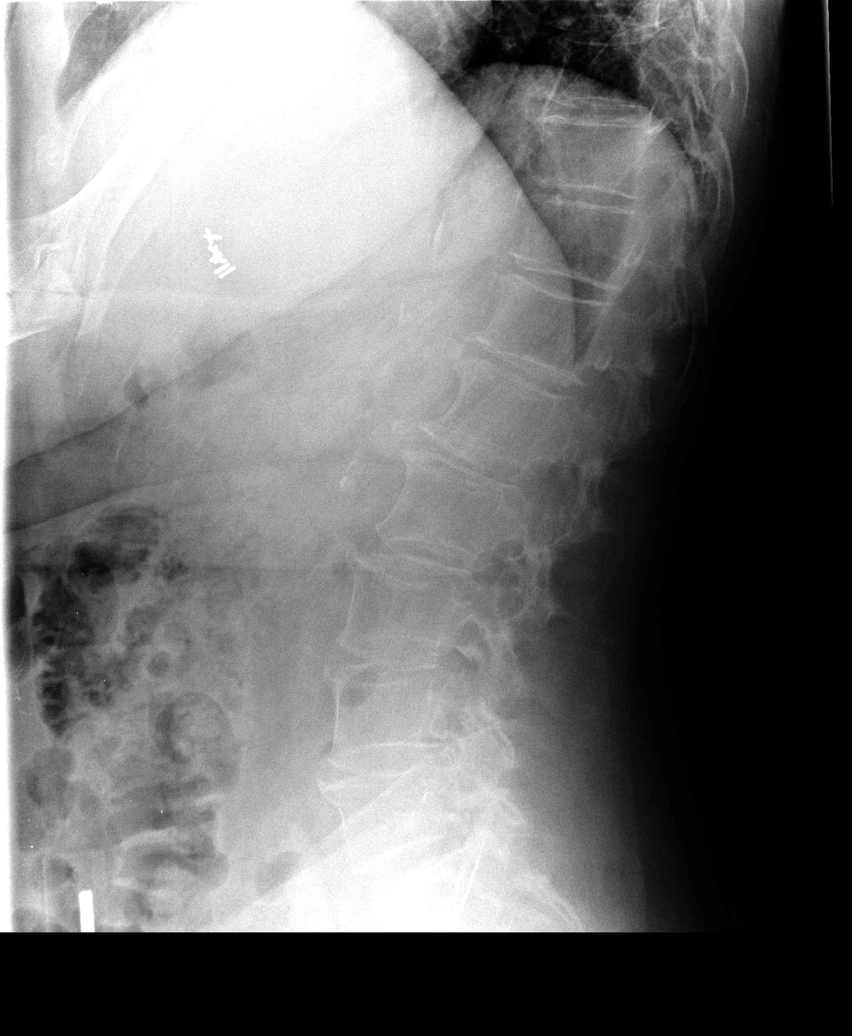

[view not recorded (5 of 5)]
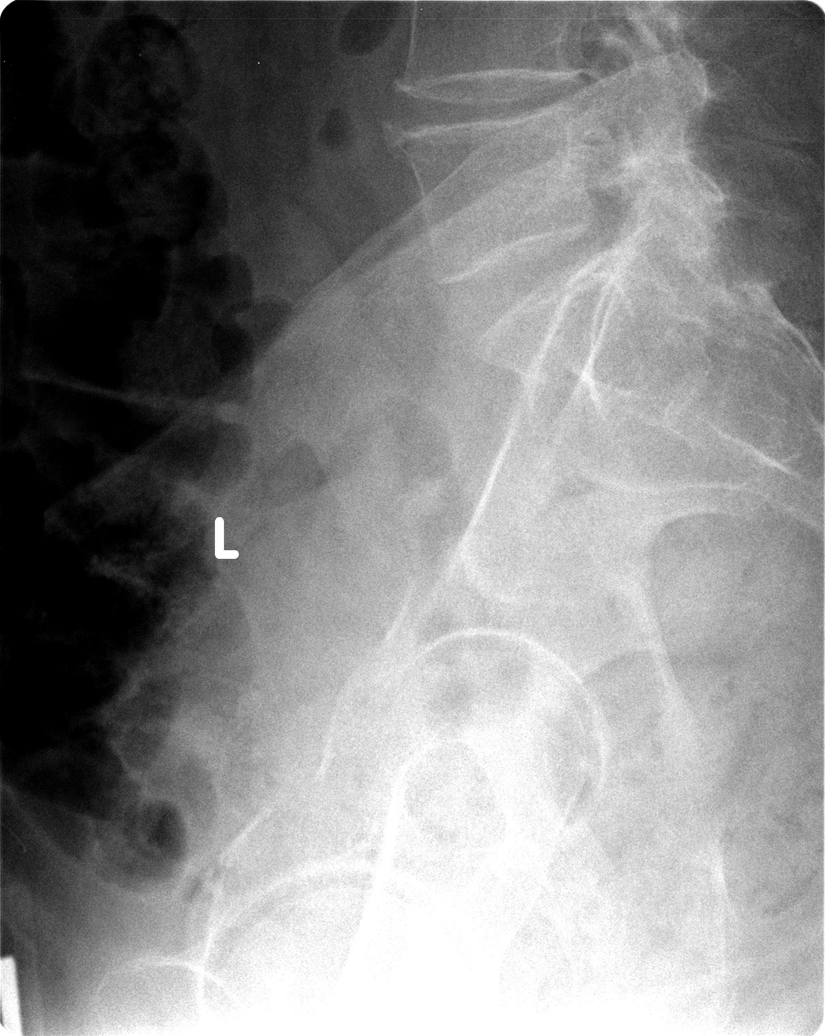

[5 of 5 positions shown; findings below may reference images not displayed]

FINDINGS: There is mild osteopenia.  Diffuse degenerative disc
disease throughout the lower thoracic and lumbar spine.  Normal
alignment.  No fracture.  SI joints are symmetric and unremarkable.
IMPRESSION: Osteopenia.  Spondylosis.  No acute findings.

## 2013-02-22 DIAGNOSIS — F068 Other specified mental disorders due to known physiological condition: Secondary | ICD-10-CM | POA: Diagnosis not present

## 2013-02-22 DIAGNOSIS — I10 Essential (primary) hypertension: Secondary | ICD-10-CM | POA: Diagnosis not present

## 2013-02-22 DIAGNOSIS — R946 Abnormal results of thyroid function studies: Secondary | ICD-10-CM | POA: Diagnosis not present

## 2013-02-22 DIAGNOSIS — E119 Type 2 diabetes mellitus without complications: Secondary | ICD-10-CM | POA: Diagnosis not present

## 2013-03-28 DIAGNOSIS — R488 Other symbolic dysfunctions: Secondary | ICD-10-CM | POA: Diagnosis not present

## 2013-03-28 DIAGNOSIS — E109 Type 1 diabetes mellitus without complications: Secondary | ICD-10-CM | POA: Diagnosis not present

## 2013-03-28 DIAGNOSIS — M6281 Muscle weakness (generalized): Secondary | ICD-10-CM | POA: Diagnosis not present

## 2013-03-28 DIAGNOSIS — F0390 Unspecified dementia without behavioral disturbance: Secondary | ICD-10-CM | POA: Diagnosis not present

## 2013-03-29 DIAGNOSIS — E109 Type 1 diabetes mellitus without complications: Secondary | ICD-10-CM | POA: Diagnosis not present

## 2013-03-29 DIAGNOSIS — F0390 Unspecified dementia without behavioral disturbance: Secondary | ICD-10-CM | POA: Diagnosis not present

## 2013-03-29 DIAGNOSIS — R488 Other symbolic dysfunctions: Secondary | ICD-10-CM | POA: Diagnosis not present

## 2013-03-29 DIAGNOSIS — M6281 Muscle weakness (generalized): Secondary | ICD-10-CM | POA: Diagnosis not present

## 2013-03-31 DIAGNOSIS — M6281 Muscle weakness (generalized): Secondary | ICD-10-CM | POA: Diagnosis not present

## 2013-03-31 DIAGNOSIS — E109 Type 1 diabetes mellitus without complications: Secondary | ICD-10-CM | POA: Diagnosis not present

## 2013-03-31 DIAGNOSIS — R488 Other symbolic dysfunctions: Secondary | ICD-10-CM | POA: Diagnosis not present

## 2013-03-31 DIAGNOSIS — F0390 Unspecified dementia without behavioral disturbance: Secondary | ICD-10-CM | POA: Diagnosis not present

## 2013-04-01 DIAGNOSIS — M6281 Muscle weakness (generalized): Secondary | ICD-10-CM | POA: Diagnosis not present

## 2013-04-01 DIAGNOSIS — R488 Other symbolic dysfunctions: Secondary | ICD-10-CM | POA: Diagnosis not present

## 2013-04-01 DIAGNOSIS — E109 Type 1 diabetes mellitus without complications: Secondary | ICD-10-CM | POA: Diagnosis not present

## 2013-04-01 DIAGNOSIS — F0390 Unspecified dementia without behavioral disturbance: Secondary | ICD-10-CM | POA: Diagnosis not present

## 2013-04-04 DIAGNOSIS — R488 Other symbolic dysfunctions: Secondary | ICD-10-CM | POA: Diagnosis not present

## 2013-04-04 DIAGNOSIS — E109 Type 1 diabetes mellitus without complications: Secondary | ICD-10-CM | POA: Diagnosis not present

## 2013-04-04 DIAGNOSIS — F0390 Unspecified dementia without behavioral disturbance: Secondary | ICD-10-CM | POA: Diagnosis not present

## 2013-04-04 DIAGNOSIS — M6281 Muscle weakness (generalized): Secondary | ICD-10-CM | POA: Diagnosis not present

## 2013-04-05 DIAGNOSIS — F0390 Unspecified dementia without behavioral disturbance: Secondary | ICD-10-CM | POA: Diagnosis not present

## 2013-04-05 DIAGNOSIS — E109 Type 1 diabetes mellitus without complications: Secondary | ICD-10-CM | POA: Diagnosis not present

## 2013-04-05 DIAGNOSIS — M6281 Muscle weakness (generalized): Secondary | ICD-10-CM | POA: Diagnosis not present

## 2013-04-05 DIAGNOSIS — R488 Other symbolic dysfunctions: Secondary | ICD-10-CM | POA: Diagnosis not present

## 2013-04-06 DIAGNOSIS — R488 Other symbolic dysfunctions: Secondary | ICD-10-CM | POA: Diagnosis not present

## 2013-04-06 DIAGNOSIS — M6281 Muscle weakness (generalized): Secondary | ICD-10-CM | POA: Diagnosis not present

## 2013-04-06 DIAGNOSIS — F0390 Unspecified dementia without behavioral disturbance: Secondary | ICD-10-CM | POA: Diagnosis not present

## 2013-04-06 DIAGNOSIS — E109 Type 1 diabetes mellitus without complications: Secondary | ICD-10-CM | POA: Diagnosis not present

## 2013-04-07 DIAGNOSIS — R488 Other symbolic dysfunctions: Secondary | ICD-10-CM | POA: Diagnosis not present

## 2013-04-07 DIAGNOSIS — E109 Type 1 diabetes mellitus without complications: Secondary | ICD-10-CM | POA: Diagnosis not present

## 2013-04-07 DIAGNOSIS — M6281 Muscle weakness (generalized): Secondary | ICD-10-CM | POA: Diagnosis not present

## 2013-04-07 DIAGNOSIS — F0390 Unspecified dementia without behavioral disturbance: Secondary | ICD-10-CM | POA: Diagnosis not present

## 2013-04-11 DIAGNOSIS — R488 Other symbolic dysfunctions: Secondary | ICD-10-CM | POA: Diagnosis not present

## 2013-04-11 DIAGNOSIS — M6281 Muscle weakness (generalized): Secondary | ICD-10-CM | POA: Diagnosis not present

## 2013-04-11 DIAGNOSIS — F0390 Unspecified dementia without behavioral disturbance: Secondary | ICD-10-CM | POA: Diagnosis not present

## 2013-04-11 DIAGNOSIS — E109 Type 1 diabetes mellitus without complications: Secondary | ICD-10-CM | POA: Diagnosis not present

## 2013-04-12 DIAGNOSIS — R488 Other symbolic dysfunctions: Secondary | ICD-10-CM | POA: Diagnosis not present

## 2013-04-12 DIAGNOSIS — M6281 Muscle weakness (generalized): Secondary | ICD-10-CM | POA: Diagnosis not present

## 2013-04-12 DIAGNOSIS — F0390 Unspecified dementia without behavioral disturbance: Secondary | ICD-10-CM | POA: Diagnosis not present

## 2013-04-12 DIAGNOSIS — E109 Type 1 diabetes mellitus without complications: Secondary | ICD-10-CM | POA: Diagnosis not present

## 2013-04-13 DIAGNOSIS — R488 Other symbolic dysfunctions: Secondary | ICD-10-CM | POA: Diagnosis not present

## 2013-04-13 DIAGNOSIS — E109 Type 1 diabetes mellitus without complications: Secondary | ICD-10-CM | POA: Diagnosis not present

## 2013-04-13 DIAGNOSIS — F0390 Unspecified dementia without behavioral disturbance: Secondary | ICD-10-CM | POA: Diagnosis not present

## 2013-04-13 DIAGNOSIS — M6281 Muscle weakness (generalized): Secondary | ICD-10-CM | POA: Diagnosis not present

## 2013-04-14 DIAGNOSIS — E109 Type 1 diabetes mellitus without complications: Secondary | ICD-10-CM | POA: Diagnosis not present

## 2013-04-14 DIAGNOSIS — F0390 Unspecified dementia without behavioral disturbance: Secondary | ICD-10-CM | POA: Diagnosis not present

## 2013-04-14 DIAGNOSIS — M6281 Muscle weakness (generalized): Secondary | ICD-10-CM | POA: Diagnosis not present

## 2013-04-14 DIAGNOSIS — R488 Other symbolic dysfunctions: Secondary | ICD-10-CM | POA: Diagnosis not present

## 2013-04-15 DIAGNOSIS — E109 Type 1 diabetes mellitus without complications: Secondary | ICD-10-CM | POA: Diagnosis not present

## 2013-04-15 DIAGNOSIS — R488 Other symbolic dysfunctions: Secondary | ICD-10-CM | POA: Diagnosis not present

## 2013-04-15 DIAGNOSIS — F0390 Unspecified dementia without behavioral disturbance: Secondary | ICD-10-CM | POA: Diagnosis not present

## 2013-04-15 DIAGNOSIS — M6281 Muscle weakness (generalized): Secondary | ICD-10-CM | POA: Diagnosis not present

## 2013-04-18 DIAGNOSIS — M6281 Muscle weakness (generalized): Secondary | ICD-10-CM | POA: Diagnosis not present

## 2013-04-18 DIAGNOSIS — E109 Type 1 diabetes mellitus without complications: Secondary | ICD-10-CM | POA: Diagnosis not present

## 2013-04-18 DIAGNOSIS — R488 Other symbolic dysfunctions: Secondary | ICD-10-CM | POA: Diagnosis not present

## 2013-04-18 DIAGNOSIS — F0390 Unspecified dementia without behavioral disturbance: Secondary | ICD-10-CM | POA: Diagnosis not present

## 2013-04-19 DIAGNOSIS — F0390 Unspecified dementia without behavioral disturbance: Secondary | ICD-10-CM | POA: Diagnosis not present

## 2013-04-19 DIAGNOSIS — M6281 Muscle weakness (generalized): Secondary | ICD-10-CM | POA: Diagnosis not present

## 2013-04-19 DIAGNOSIS — R488 Other symbolic dysfunctions: Secondary | ICD-10-CM | POA: Diagnosis not present

## 2013-04-19 DIAGNOSIS — E109 Type 1 diabetes mellitus without complications: Secondary | ICD-10-CM | POA: Diagnosis not present

## 2013-04-20 DIAGNOSIS — E109 Type 1 diabetes mellitus without complications: Secondary | ICD-10-CM | POA: Diagnosis not present

## 2013-04-20 DIAGNOSIS — R488 Other symbolic dysfunctions: Secondary | ICD-10-CM | POA: Diagnosis not present

## 2013-04-20 DIAGNOSIS — F0390 Unspecified dementia without behavioral disturbance: Secondary | ICD-10-CM | POA: Diagnosis not present

## 2013-04-20 DIAGNOSIS — M6281 Muscle weakness (generalized): Secondary | ICD-10-CM | POA: Diagnosis not present

## 2013-04-21 DIAGNOSIS — R488 Other symbolic dysfunctions: Secondary | ICD-10-CM | POA: Diagnosis not present

## 2013-04-21 DIAGNOSIS — F0390 Unspecified dementia without behavioral disturbance: Secondary | ICD-10-CM | POA: Diagnosis not present

## 2013-04-21 DIAGNOSIS — M6281 Muscle weakness (generalized): Secondary | ICD-10-CM | POA: Diagnosis not present

## 2013-04-21 DIAGNOSIS — E109 Type 1 diabetes mellitus without complications: Secondary | ICD-10-CM | POA: Diagnosis not present

## 2013-04-22 DIAGNOSIS — M6281 Muscle weakness (generalized): Secondary | ICD-10-CM | POA: Diagnosis not present

## 2013-04-22 DIAGNOSIS — E109 Type 1 diabetes mellitus without complications: Secondary | ICD-10-CM | POA: Diagnosis not present

## 2013-04-22 DIAGNOSIS — R488 Other symbolic dysfunctions: Secondary | ICD-10-CM | POA: Diagnosis not present

## 2013-04-22 DIAGNOSIS — F0390 Unspecified dementia without behavioral disturbance: Secondary | ICD-10-CM | POA: Diagnosis not present

## 2013-04-25 DIAGNOSIS — M6281 Muscle weakness (generalized): Secondary | ICD-10-CM | POA: Diagnosis not present

## 2013-04-25 DIAGNOSIS — E109 Type 1 diabetes mellitus without complications: Secondary | ICD-10-CM | POA: Diagnosis not present

## 2013-04-25 DIAGNOSIS — F0390 Unspecified dementia without behavioral disturbance: Secondary | ICD-10-CM | POA: Diagnosis not present

## 2013-04-25 DIAGNOSIS — R488 Other symbolic dysfunctions: Secondary | ICD-10-CM | POA: Diagnosis not present

## 2013-04-26 DIAGNOSIS — M6281 Muscle weakness (generalized): Secondary | ICD-10-CM | POA: Diagnosis not present

## 2013-04-26 DIAGNOSIS — E109 Type 1 diabetes mellitus without complications: Secondary | ICD-10-CM | POA: Diagnosis not present

## 2013-04-26 DIAGNOSIS — R488 Other symbolic dysfunctions: Secondary | ICD-10-CM | POA: Diagnosis not present

## 2013-04-26 DIAGNOSIS — F0390 Unspecified dementia without behavioral disturbance: Secondary | ICD-10-CM | POA: Diagnosis not present

## 2013-04-27 DIAGNOSIS — E109 Type 1 diabetes mellitus without complications: Secondary | ICD-10-CM | POA: Diagnosis not present

## 2013-04-27 DIAGNOSIS — R488 Other symbolic dysfunctions: Secondary | ICD-10-CM | POA: Diagnosis not present

## 2013-04-27 DIAGNOSIS — F0390 Unspecified dementia without behavioral disturbance: Secondary | ICD-10-CM | POA: Diagnosis not present

## 2013-04-27 DIAGNOSIS — M6281 Muscle weakness (generalized): Secondary | ICD-10-CM | POA: Diagnosis not present

## 2013-04-28 DIAGNOSIS — M6281 Muscle weakness (generalized): Secondary | ICD-10-CM | POA: Diagnosis not present

## 2013-04-28 DIAGNOSIS — R488 Other symbolic dysfunctions: Secondary | ICD-10-CM | POA: Diagnosis not present

## 2013-04-28 DIAGNOSIS — E109 Type 1 diabetes mellitus without complications: Secondary | ICD-10-CM | POA: Diagnosis not present

## 2013-04-28 DIAGNOSIS — F0390 Unspecified dementia without behavioral disturbance: Secondary | ICD-10-CM | POA: Diagnosis not present

## 2013-05-02 DIAGNOSIS — R488 Other symbolic dysfunctions: Secondary | ICD-10-CM | POA: Diagnosis not present

## 2013-05-02 DIAGNOSIS — M6281 Muscle weakness (generalized): Secondary | ICD-10-CM | POA: Diagnosis not present

## 2013-05-02 DIAGNOSIS — E109 Type 1 diabetes mellitus without complications: Secondary | ICD-10-CM | POA: Diagnosis not present

## 2013-05-02 DIAGNOSIS — F0390 Unspecified dementia without behavioral disturbance: Secondary | ICD-10-CM | POA: Diagnosis not present

## 2013-05-03 DIAGNOSIS — E109 Type 1 diabetes mellitus without complications: Secondary | ICD-10-CM | POA: Diagnosis not present

## 2013-05-03 DIAGNOSIS — R488 Other symbolic dysfunctions: Secondary | ICD-10-CM | POA: Diagnosis not present

## 2013-05-03 DIAGNOSIS — M6281 Muscle weakness (generalized): Secondary | ICD-10-CM | POA: Diagnosis not present

## 2013-05-03 DIAGNOSIS — F0390 Unspecified dementia without behavioral disturbance: Secondary | ICD-10-CM | POA: Diagnosis not present

## 2013-05-04 DIAGNOSIS — M6281 Muscle weakness (generalized): Secondary | ICD-10-CM | POA: Diagnosis not present

## 2013-05-04 DIAGNOSIS — E109 Type 1 diabetes mellitus without complications: Secondary | ICD-10-CM | POA: Diagnosis not present

## 2013-05-04 DIAGNOSIS — R488 Other symbolic dysfunctions: Secondary | ICD-10-CM | POA: Diagnosis not present

## 2013-05-04 DIAGNOSIS — F0390 Unspecified dementia without behavioral disturbance: Secondary | ICD-10-CM | POA: Diagnosis not present

## 2013-05-05 DIAGNOSIS — M6281 Muscle weakness (generalized): Secondary | ICD-10-CM | POA: Diagnosis not present

## 2013-05-05 DIAGNOSIS — F0390 Unspecified dementia without behavioral disturbance: Secondary | ICD-10-CM | POA: Diagnosis not present

## 2013-05-05 DIAGNOSIS — R488 Other symbolic dysfunctions: Secondary | ICD-10-CM | POA: Diagnosis not present

## 2013-05-05 DIAGNOSIS — E109 Type 1 diabetes mellitus without complications: Secondary | ICD-10-CM | POA: Diagnosis not present

## 2013-05-10 DIAGNOSIS — R488 Other symbolic dysfunctions: Secondary | ICD-10-CM | POA: Diagnosis not present

## 2013-05-10 DIAGNOSIS — M6281 Muscle weakness (generalized): Secondary | ICD-10-CM | POA: Diagnosis not present

## 2013-05-10 DIAGNOSIS — F0390 Unspecified dementia without behavioral disturbance: Secondary | ICD-10-CM | POA: Diagnosis not present

## 2013-05-10 DIAGNOSIS — E109 Type 1 diabetes mellitus without complications: Secondary | ICD-10-CM | POA: Diagnosis not present

## 2013-05-11 DIAGNOSIS — M6281 Muscle weakness (generalized): Secondary | ICD-10-CM | POA: Diagnosis not present

## 2013-05-11 DIAGNOSIS — E109 Type 1 diabetes mellitus without complications: Secondary | ICD-10-CM | POA: Diagnosis not present

## 2013-05-11 DIAGNOSIS — R488 Other symbolic dysfunctions: Secondary | ICD-10-CM | POA: Diagnosis not present

## 2013-05-11 DIAGNOSIS — F0390 Unspecified dementia without behavioral disturbance: Secondary | ICD-10-CM | POA: Diagnosis not present

## 2013-05-12 DIAGNOSIS — M6281 Muscle weakness (generalized): Secondary | ICD-10-CM | POA: Diagnosis not present

## 2013-05-12 DIAGNOSIS — R488 Other symbolic dysfunctions: Secondary | ICD-10-CM | POA: Diagnosis not present

## 2013-05-12 DIAGNOSIS — E109 Type 1 diabetes mellitus without complications: Secondary | ICD-10-CM | POA: Diagnosis not present

## 2013-05-12 DIAGNOSIS — F0390 Unspecified dementia without behavioral disturbance: Secondary | ICD-10-CM | POA: Diagnosis not present

## 2013-05-13 DIAGNOSIS — R488 Other symbolic dysfunctions: Secondary | ICD-10-CM | POA: Diagnosis not present

## 2013-05-13 DIAGNOSIS — E109 Type 1 diabetes mellitus without complications: Secondary | ICD-10-CM | POA: Diagnosis not present

## 2013-05-13 DIAGNOSIS — F0390 Unspecified dementia without behavioral disturbance: Secondary | ICD-10-CM | POA: Diagnosis not present

## 2013-05-13 DIAGNOSIS — M6281 Muscle weakness (generalized): Secondary | ICD-10-CM | POA: Diagnosis not present

## 2013-05-16 DIAGNOSIS — F0390 Unspecified dementia without behavioral disturbance: Secondary | ICD-10-CM | POA: Diagnosis not present

## 2013-05-16 DIAGNOSIS — M6281 Muscle weakness (generalized): Secondary | ICD-10-CM | POA: Diagnosis not present

## 2013-05-16 DIAGNOSIS — E109 Type 1 diabetes mellitus without complications: Secondary | ICD-10-CM | POA: Diagnosis not present

## 2013-05-16 DIAGNOSIS — R488 Other symbolic dysfunctions: Secondary | ICD-10-CM | POA: Diagnosis not present

## 2013-05-17 DIAGNOSIS — F0390 Unspecified dementia without behavioral disturbance: Secondary | ICD-10-CM | POA: Diagnosis not present

## 2013-05-17 DIAGNOSIS — M6281 Muscle weakness (generalized): Secondary | ICD-10-CM | POA: Diagnosis not present

## 2013-05-17 DIAGNOSIS — E109 Type 1 diabetes mellitus without complications: Secondary | ICD-10-CM | POA: Diagnosis not present

## 2013-05-17 DIAGNOSIS — R488 Other symbolic dysfunctions: Secondary | ICD-10-CM | POA: Diagnosis not present

## 2013-05-18 DIAGNOSIS — F0390 Unspecified dementia without behavioral disturbance: Secondary | ICD-10-CM | POA: Diagnosis not present

## 2013-05-18 DIAGNOSIS — R488 Other symbolic dysfunctions: Secondary | ICD-10-CM | POA: Diagnosis not present

## 2013-05-18 DIAGNOSIS — E109 Type 1 diabetes mellitus without complications: Secondary | ICD-10-CM | POA: Diagnosis not present

## 2013-05-18 DIAGNOSIS — M6281 Muscle weakness (generalized): Secondary | ICD-10-CM | POA: Diagnosis not present

## 2013-05-19 DIAGNOSIS — M6281 Muscle weakness (generalized): Secondary | ICD-10-CM | POA: Diagnosis not present

## 2013-05-19 DIAGNOSIS — F0390 Unspecified dementia without behavioral disturbance: Secondary | ICD-10-CM | POA: Diagnosis not present

## 2013-05-19 DIAGNOSIS — E109 Type 1 diabetes mellitus without complications: Secondary | ICD-10-CM | POA: Diagnosis not present

## 2013-05-19 DIAGNOSIS — R488 Other symbolic dysfunctions: Secondary | ICD-10-CM | POA: Diagnosis not present

## 2013-05-24 DIAGNOSIS — R488 Other symbolic dysfunctions: Secondary | ICD-10-CM | POA: Diagnosis not present

## 2013-05-24 DIAGNOSIS — F0390 Unspecified dementia without behavioral disturbance: Secondary | ICD-10-CM | POA: Diagnosis not present

## 2013-05-24 DIAGNOSIS — E109 Type 1 diabetes mellitus without complications: Secondary | ICD-10-CM | POA: Diagnosis not present

## 2013-05-24 DIAGNOSIS — M6281 Muscle weakness (generalized): Secondary | ICD-10-CM | POA: Diagnosis not present

## 2013-05-25 DIAGNOSIS — R488 Other symbolic dysfunctions: Secondary | ICD-10-CM | POA: Diagnosis not present

## 2013-05-25 DIAGNOSIS — F0390 Unspecified dementia without behavioral disturbance: Secondary | ICD-10-CM | POA: Diagnosis not present

## 2013-05-25 DIAGNOSIS — E109 Type 1 diabetes mellitus without complications: Secondary | ICD-10-CM | POA: Diagnosis not present

## 2013-05-25 DIAGNOSIS — M6281 Muscle weakness (generalized): Secondary | ICD-10-CM | POA: Diagnosis not present

## 2013-05-26 DIAGNOSIS — R488 Other symbolic dysfunctions: Secondary | ICD-10-CM | POA: Diagnosis not present

## 2013-05-26 DIAGNOSIS — E109 Type 1 diabetes mellitus without complications: Secondary | ICD-10-CM | POA: Diagnosis not present

## 2013-05-26 DIAGNOSIS — M6281 Muscle weakness (generalized): Secondary | ICD-10-CM | POA: Diagnosis not present

## 2013-05-26 DIAGNOSIS — F0390 Unspecified dementia without behavioral disturbance: Secondary | ICD-10-CM | POA: Diagnosis not present

## 2013-05-30 DIAGNOSIS — R488 Other symbolic dysfunctions: Secondary | ICD-10-CM | POA: Diagnosis not present

## 2013-05-30 DIAGNOSIS — F0390 Unspecified dementia without behavioral disturbance: Secondary | ICD-10-CM | POA: Diagnosis not present

## 2013-05-30 DIAGNOSIS — E109 Type 1 diabetes mellitus without complications: Secondary | ICD-10-CM | POA: Diagnosis not present

## 2013-05-30 DIAGNOSIS — M6281 Muscle weakness (generalized): Secondary | ICD-10-CM | POA: Diagnosis not present

## 2013-05-31 DIAGNOSIS — R488 Other symbolic dysfunctions: Secondary | ICD-10-CM | POA: Diagnosis not present

## 2013-05-31 DIAGNOSIS — F0390 Unspecified dementia without behavioral disturbance: Secondary | ICD-10-CM | POA: Diagnosis not present

## 2013-05-31 DIAGNOSIS — E109 Type 1 diabetes mellitus without complications: Secondary | ICD-10-CM | POA: Diagnosis not present

## 2013-05-31 DIAGNOSIS — M6281 Muscle weakness (generalized): Secondary | ICD-10-CM | POA: Diagnosis not present

## 2013-06-02 DIAGNOSIS — M6281 Muscle weakness (generalized): Secondary | ICD-10-CM | POA: Diagnosis not present

## 2013-06-02 DIAGNOSIS — F0390 Unspecified dementia without behavioral disturbance: Secondary | ICD-10-CM | POA: Diagnosis not present

## 2013-06-02 DIAGNOSIS — R488 Other symbolic dysfunctions: Secondary | ICD-10-CM | POA: Diagnosis not present

## 2013-06-02 DIAGNOSIS — Z23 Encounter for immunization: Secondary | ICD-10-CM | POA: Diagnosis not present

## 2013-06-02 DIAGNOSIS — E109 Type 1 diabetes mellitus without complications: Secondary | ICD-10-CM | POA: Diagnosis not present

## 2013-06-03 DIAGNOSIS — E109 Type 1 diabetes mellitus without complications: Secondary | ICD-10-CM | POA: Diagnosis not present

## 2013-06-03 DIAGNOSIS — F0390 Unspecified dementia without behavioral disturbance: Secondary | ICD-10-CM | POA: Diagnosis not present

## 2013-06-03 DIAGNOSIS — R488 Other symbolic dysfunctions: Secondary | ICD-10-CM | POA: Diagnosis not present

## 2013-06-03 DIAGNOSIS — M6281 Muscle weakness (generalized): Secondary | ICD-10-CM | POA: Diagnosis not present

## 2013-06-07 DIAGNOSIS — E109 Type 1 diabetes mellitus without complications: Secondary | ICD-10-CM | POA: Diagnosis not present

## 2013-06-07 DIAGNOSIS — M6281 Muscle weakness (generalized): Secondary | ICD-10-CM | POA: Diagnosis not present

## 2013-06-07 DIAGNOSIS — R488 Other symbolic dysfunctions: Secondary | ICD-10-CM | POA: Diagnosis not present

## 2013-06-07 DIAGNOSIS — F0390 Unspecified dementia without behavioral disturbance: Secondary | ICD-10-CM | POA: Diagnosis not present

## 2013-06-08 DIAGNOSIS — R488 Other symbolic dysfunctions: Secondary | ICD-10-CM | POA: Diagnosis not present

## 2013-06-08 DIAGNOSIS — F0390 Unspecified dementia without behavioral disturbance: Secondary | ICD-10-CM | POA: Diagnosis not present

## 2013-06-08 DIAGNOSIS — E109 Type 1 diabetes mellitus without complications: Secondary | ICD-10-CM | POA: Diagnosis not present

## 2013-06-08 DIAGNOSIS — M6281 Muscle weakness (generalized): Secondary | ICD-10-CM | POA: Diagnosis not present

## 2013-06-14 DIAGNOSIS — M6281 Muscle weakness (generalized): Secondary | ICD-10-CM | POA: Diagnosis not present

## 2013-06-14 DIAGNOSIS — E109 Type 1 diabetes mellitus without complications: Secondary | ICD-10-CM | POA: Diagnosis not present

## 2013-06-14 DIAGNOSIS — F0390 Unspecified dementia without behavioral disturbance: Secondary | ICD-10-CM | POA: Diagnosis not present

## 2013-06-14 DIAGNOSIS — R488 Other symbolic dysfunctions: Secondary | ICD-10-CM | POA: Diagnosis not present

## 2013-06-15 DIAGNOSIS — E109 Type 1 diabetes mellitus without complications: Secondary | ICD-10-CM | POA: Diagnosis not present

## 2013-06-15 DIAGNOSIS — F0390 Unspecified dementia without behavioral disturbance: Secondary | ICD-10-CM | POA: Diagnosis not present

## 2013-06-15 DIAGNOSIS — R488 Other symbolic dysfunctions: Secondary | ICD-10-CM | POA: Diagnosis not present

## 2013-06-15 DIAGNOSIS — M6281 Muscle weakness (generalized): Secondary | ICD-10-CM | POA: Diagnosis not present

## 2013-06-16 DIAGNOSIS — F0390 Unspecified dementia without behavioral disturbance: Secondary | ICD-10-CM | POA: Diagnosis not present

## 2013-06-16 DIAGNOSIS — R488 Other symbolic dysfunctions: Secondary | ICD-10-CM | POA: Diagnosis not present

## 2013-06-16 DIAGNOSIS — M6281 Muscle weakness (generalized): Secondary | ICD-10-CM | POA: Diagnosis not present

## 2013-06-16 DIAGNOSIS — E109 Type 1 diabetes mellitus without complications: Secondary | ICD-10-CM | POA: Diagnosis not present

## 2013-06-21 DIAGNOSIS — E109 Type 1 diabetes mellitus without complications: Secondary | ICD-10-CM | POA: Diagnosis not present

## 2013-06-21 DIAGNOSIS — R488 Other symbolic dysfunctions: Secondary | ICD-10-CM | POA: Diagnosis not present

## 2013-06-21 DIAGNOSIS — F0390 Unspecified dementia without behavioral disturbance: Secondary | ICD-10-CM | POA: Diagnosis not present

## 2013-06-21 DIAGNOSIS — M6281 Muscle weakness (generalized): Secondary | ICD-10-CM | POA: Diagnosis not present

## 2013-06-22 DIAGNOSIS — E109 Type 1 diabetes mellitus without complications: Secondary | ICD-10-CM | POA: Diagnosis not present

## 2013-06-22 DIAGNOSIS — M6281 Muscle weakness (generalized): Secondary | ICD-10-CM | POA: Diagnosis not present

## 2013-06-23 DIAGNOSIS — M6281 Muscle weakness (generalized): Secondary | ICD-10-CM | POA: Diagnosis not present

## 2013-06-23 DIAGNOSIS — E109 Type 1 diabetes mellitus without complications: Secondary | ICD-10-CM | POA: Diagnosis not present

## 2013-06-27 DIAGNOSIS — M6281 Muscle weakness (generalized): Secondary | ICD-10-CM | POA: Diagnosis not present

## 2013-06-27 DIAGNOSIS — E109 Type 1 diabetes mellitus without complications: Secondary | ICD-10-CM | POA: Diagnosis not present

## 2013-06-28 DIAGNOSIS — M6281 Muscle weakness (generalized): Secondary | ICD-10-CM | POA: Diagnosis not present

## 2013-06-28 DIAGNOSIS — E109 Type 1 diabetes mellitus without complications: Secondary | ICD-10-CM | POA: Diagnosis not present

## 2013-06-29 DIAGNOSIS — M6281 Muscle weakness (generalized): Secondary | ICD-10-CM | POA: Diagnosis not present

## 2013-06-29 DIAGNOSIS — E109 Type 1 diabetes mellitus without complications: Secondary | ICD-10-CM | POA: Diagnosis not present

## 2013-07-04 DIAGNOSIS — E109 Type 1 diabetes mellitus without complications: Secondary | ICD-10-CM | POA: Diagnosis not present

## 2013-07-04 DIAGNOSIS — M6281 Muscle weakness (generalized): Secondary | ICD-10-CM | POA: Diagnosis not present

## 2013-07-05 DIAGNOSIS — E109 Type 1 diabetes mellitus without complications: Secondary | ICD-10-CM | POA: Diagnosis not present

## 2013-07-05 DIAGNOSIS — M6281 Muscle weakness (generalized): Secondary | ICD-10-CM | POA: Diagnosis not present

## 2013-07-06 DIAGNOSIS — M6281 Muscle weakness (generalized): Secondary | ICD-10-CM | POA: Diagnosis not present

## 2013-07-06 DIAGNOSIS — E109 Type 1 diabetes mellitus without complications: Secondary | ICD-10-CM | POA: Diagnosis not present

## 2013-07-08 DIAGNOSIS — M6281 Muscle weakness (generalized): Secondary | ICD-10-CM | POA: Diagnosis not present

## 2013-07-08 DIAGNOSIS — E109 Type 1 diabetes mellitus without complications: Secondary | ICD-10-CM | POA: Diagnosis not present

## 2013-07-11 DIAGNOSIS — E109 Type 1 diabetes mellitus without complications: Secondary | ICD-10-CM | POA: Diagnosis not present

## 2013-07-11 DIAGNOSIS — M6281 Muscle weakness (generalized): Secondary | ICD-10-CM | POA: Diagnosis not present

## 2013-07-13 DIAGNOSIS — E109 Type 1 diabetes mellitus without complications: Secondary | ICD-10-CM | POA: Diagnosis not present

## 2013-07-13 DIAGNOSIS — M6281 Muscle weakness (generalized): Secondary | ICD-10-CM | POA: Diagnosis not present

## 2013-07-15 DIAGNOSIS — E109 Type 1 diabetes mellitus without complications: Secondary | ICD-10-CM | POA: Diagnosis not present

## 2013-07-15 DIAGNOSIS — M6281 Muscle weakness (generalized): Secondary | ICD-10-CM | POA: Diagnosis not present

## 2013-07-20 DIAGNOSIS — E109 Type 1 diabetes mellitus without complications: Secondary | ICD-10-CM | POA: Diagnosis not present

## 2013-07-20 DIAGNOSIS — M6281 Muscle weakness (generalized): Secondary | ICD-10-CM | POA: Diagnosis not present

## 2013-07-22 DIAGNOSIS — E109 Type 1 diabetes mellitus without complications: Secondary | ICD-10-CM | POA: Diagnosis not present

## 2013-07-22 DIAGNOSIS — M6281 Muscle weakness (generalized): Secondary | ICD-10-CM | POA: Diagnosis not present

## 2013-07-25 DIAGNOSIS — E109 Type 1 diabetes mellitus without complications: Secondary | ICD-10-CM | POA: Diagnosis not present

## 2013-07-25 DIAGNOSIS — M6281 Muscle weakness (generalized): Secondary | ICD-10-CM | POA: Diagnosis not present

## 2013-07-26 DIAGNOSIS — M6281 Muscle weakness (generalized): Secondary | ICD-10-CM | POA: Diagnosis not present

## 2013-07-26 DIAGNOSIS — E109 Type 1 diabetes mellitus without complications: Secondary | ICD-10-CM | POA: Diagnosis not present

## 2013-07-27 DIAGNOSIS — E109 Type 1 diabetes mellitus without complications: Secondary | ICD-10-CM | POA: Diagnosis not present

## 2013-07-27 DIAGNOSIS — M6281 Muscle weakness (generalized): Secondary | ICD-10-CM | POA: Diagnosis not present

## 2013-08-01 DIAGNOSIS — E109 Type 1 diabetes mellitus without complications: Secondary | ICD-10-CM | POA: Diagnosis not present

## 2013-08-01 DIAGNOSIS — M6281 Muscle weakness (generalized): Secondary | ICD-10-CM | POA: Diagnosis not present

## 2013-08-03 DIAGNOSIS — M6281 Muscle weakness (generalized): Secondary | ICD-10-CM | POA: Diagnosis not present

## 2013-08-03 DIAGNOSIS — E109 Type 1 diabetes mellitus without complications: Secondary | ICD-10-CM | POA: Diagnosis not present

## 2013-08-04 DIAGNOSIS — M6281 Muscle weakness (generalized): Secondary | ICD-10-CM | POA: Diagnosis not present

## 2013-08-04 DIAGNOSIS — E109 Type 1 diabetes mellitus without complications: Secondary | ICD-10-CM | POA: Diagnosis not present

## 2013-08-08 DIAGNOSIS — M6281 Muscle weakness (generalized): Secondary | ICD-10-CM | POA: Diagnosis not present

## 2013-08-08 DIAGNOSIS — E109 Type 1 diabetes mellitus without complications: Secondary | ICD-10-CM | POA: Diagnosis not present

## 2013-08-09 DIAGNOSIS — E109 Type 1 diabetes mellitus without complications: Secondary | ICD-10-CM | POA: Diagnosis not present

## 2013-08-09 DIAGNOSIS — M6281 Muscle weakness (generalized): Secondary | ICD-10-CM | POA: Diagnosis not present

## 2013-08-10 DIAGNOSIS — E109 Type 1 diabetes mellitus without complications: Secondary | ICD-10-CM | POA: Diagnosis not present

## 2013-08-10 DIAGNOSIS — M6281 Muscle weakness (generalized): Secondary | ICD-10-CM | POA: Diagnosis not present

## 2013-08-15 DIAGNOSIS — E109 Type 1 diabetes mellitus without complications: Secondary | ICD-10-CM | POA: Diagnosis not present

## 2013-08-15 DIAGNOSIS — M6281 Muscle weakness (generalized): Secondary | ICD-10-CM | POA: Diagnosis not present

## 2013-08-16 DIAGNOSIS — M6281 Muscle weakness (generalized): Secondary | ICD-10-CM | POA: Diagnosis not present

## 2013-08-16 DIAGNOSIS — E109 Type 1 diabetes mellitus without complications: Secondary | ICD-10-CM | POA: Diagnosis not present

## 2013-08-23 DIAGNOSIS — E109 Type 1 diabetes mellitus without complications: Secondary | ICD-10-CM | POA: Diagnosis not present

## 2013-08-23 DIAGNOSIS — M6281 Muscle weakness (generalized): Secondary | ICD-10-CM | POA: Diagnosis not present

## 2013-08-24 DIAGNOSIS — M6281 Muscle weakness (generalized): Secondary | ICD-10-CM | POA: Diagnosis not present

## 2013-08-24 DIAGNOSIS — E109 Type 1 diabetes mellitus without complications: Secondary | ICD-10-CM | POA: Diagnosis not present

## 2013-08-30 DIAGNOSIS — M6281 Muscle weakness (generalized): Secondary | ICD-10-CM | POA: Diagnosis not present

## 2013-08-30 DIAGNOSIS — E109 Type 1 diabetes mellitus without complications: Secondary | ICD-10-CM | POA: Diagnosis not present

## 2013-08-31 DIAGNOSIS — E119 Type 2 diabetes mellitus without complications: Secondary | ICD-10-CM | POA: Diagnosis not present

## 2013-08-31 DIAGNOSIS — E039 Hypothyroidism, unspecified: Secondary | ICD-10-CM | POA: Diagnosis not present

## 2013-08-31 DIAGNOSIS — E109 Type 1 diabetes mellitus without complications: Secondary | ICD-10-CM | POA: Diagnosis not present

## 2013-08-31 DIAGNOSIS — I10 Essential (primary) hypertension: Secondary | ICD-10-CM | POA: Diagnosis not present

## 2013-08-31 DIAGNOSIS — F068 Other specified mental disorders due to known physiological condition: Secondary | ICD-10-CM | POA: Diagnosis not present

## 2013-08-31 DIAGNOSIS — M6281 Muscle weakness (generalized): Secondary | ICD-10-CM | POA: Diagnosis not present

## 2013-09-02 DIAGNOSIS — E109 Type 1 diabetes mellitus without complications: Secondary | ICD-10-CM | POA: Diagnosis not present

## 2013-09-02 DIAGNOSIS — M6281 Muscle weakness (generalized): Secondary | ICD-10-CM | POA: Diagnosis not present

## 2013-09-05 DIAGNOSIS — E109 Type 1 diabetes mellitus without complications: Secondary | ICD-10-CM | POA: Diagnosis not present

## 2013-09-05 DIAGNOSIS — M6281 Muscle weakness (generalized): Secondary | ICD-10-CM | POA: Diagnosis not present

## 2013-09-06 DIAGNOSIS — M6281 Muscle weakness (generalized): Secondary | ICD-10-CM | POA: Diagnosis not present

## 2013-09-06 DIAGNOSIS — E109 Type 1 diabetes mellitus without complications: Secondary | ICD-10-CM | POA: Diagnosis not present

## 2013-09-07 DIAGNOSIS — E109 Type 1 diabetes mellitus without complications: Secondary | ICD-10-CM | POA: Diagnosis not present

## 2013-09-07 DIAGNOSIS — M6281 Muscle weakness (generalized): Secondary | ICD-10-CM | POA: Diagnosis not present

## 2013-09-12 DIAGNOSIS — M6281 Muscle weakness (generalized): Secondary | ICD-10-CM | POA: Diagnosis not present

## 2013-09-12 DIAGNOSIS — E109 Type 1 diabetes mellitus without complications: Secondary | ICD-10-CM | POA: Diagnosis not present

## 2013-10-27 DIAGNOSIS — N390 Urinary tract infection, site not specified: Secondary | ICD-10-CM | POA: Diagnosis not present

## 2013-10-29 ENCOUNTER — Emergency Department (HOSPITAL_COMMUNITY): Payer: Medicare Other

## 2013-10-29 ENCOUNTER — Emergency Department (HOSPITAL_COMMUNITY)
Admission: EM | Admit: 2013-10-29 | Discharge: 2013-10-29 | Disposition: A | Discharge status: Home / Self Care | Payer: Medicare Other | Attending: Emergency Medicine | Admitting: Emergency Medicine

## 2013-10-29 ENCOUNTER — Encounter (HOSPITAL_COMMUNITY): Payer: Self-pay | Admitting: Emergency Medicine

## 2013-10-29 DIAGNOSIS — R059 Cough, unspecified: Secondary | ICD-10-CM | POA: Insufficient documentation

## 2013-10-29 DIAGNOSIS — F0390 Unspecified dementia without behavioral disturbance: Secondary | ICD-10-CM | POA: Diagnosis not present

## 2013-10-29 DIAGNOSIS — N390 Urinary tract infection, site not specified: Secondary | ICD-10-CM | POA: Diagnosis not present

## 2013-10-29 DIAGNOSIS — J3489 Other specified disorders of nose and nasal sinuses: Secondary | ICD-10-CM | POA: Insufficient documentation

## 2013-10-29 DIAGNOSIS — R05 Cough: Secondary | ICD-10-CM

## 2013-10-29 DIAGNOSIS — R5381 Other malaise: Secondary | ICD-10-CM | POA: Diagnosis not present

## 2013-10-29 DIAGNOSIS — Z862 Personal history of diseases of the blood and blood-forming organs and certain disorders involving the immune mechanism: Secondary | ICD-10-CM | POA: Diagnosis not present

## 2013-10-29 DIAGNOSIS — Z7982 Long term (current) use of aspirin: Secondary | ICD-10-CM | POA: Insufficient documentation

## 2013-10-29 DIAGNOSIS — Z8673 Personal history of transient ischemic attack (TIA), and cerebral infarction without residual deficits: Secondary | ICD-10-CM | POA: Diagnosis not present

## 2013-10-29 DIAGNOSIS — I10 Essential (primary) hypertension: Secondary | ICD-10-CM | POA: Insufficient documentation

## 2013-10-29 DIAGNOSIS — E119 Type 2 diabetes mellitus without complications: Secondary | ICD-10-CM | POA: Insufficient documentation

## 2013-10-29 DIAGNOSIS — R5383 Other fatigue: Secondary | ICD-10-CM | POA: Diagnosis not present

## 2013-10-29 DIAGNOSIS — Z792 Long term (current) use of antibiotics: Secondary | ICD-10-CM | POA: Diagnosis not present

## 2013-10-29 DIAGNOSIS — Z79899 Other long term (current) drug therapy: Secondary | ICD-10-CM | POA: Diagnosis not present

## 2013-10-29 DIAGNOSIS — M6281 Muscle weakness (generalized): Secondary | ICD-10-CM | POA: Insufficient documentation

## 2013-10-29 LAB — URINALYSIS W MICROSCOPIC + REFLEX CULTURE
BILIRUBIN URINE: NEGATIVE
GLUCOSE, UA: NEGATIVE mg/dL
KETONES UR: 40 mg/dL — AB
Nitrite: POSITIVE — AB
PH: 6 (ref 5.0–8.0)
Protein, ur: NEGATIVE mg/dL
Specific Gravity, Urine: >1.03 — ABNORMAL HIGH (ref 1.005–1.030)
Urobilinogen, UA: 0.2 mg/dL (ref 0.0–1.0)

## 2013-10-29 LAB — CBC WITH DIFFERENTIAL/PLATELET
BASOS ABS: 0 10*3/uL (ref 0.0–0.1)
Basophils Relative: 0 % (ref 0–1)
EOS PCT: 1 % (ref 0–5)
Eosinophils Absolute: 0 10*3/uL (ref 0.0–0.7)
HEMATOCRIT: 35.2 % — AB (ref 36.0–46.0)
Hemoglobin: 12.2 g/dL (ref 12.0–15.0)
LYMPHS ABS: 0.4 10*3/uL — AB (ref 0.7–4.0)
LYMPHS PCT: 14 % (ref 12–46)
MCH: 33.1 pg (ref 26.0–34.0)
MCHC: 34.7 g/dL (ref 30.0–36.0)
MCV: 95.4 fL (ref 78.0–100.0)
MONOS PCT: 10 % (ref 3–12)
Monocytes Absolute: 0.3 10*3/uL (ref 0.1–1.0)
Neutro Abs: 2.3 10*3/uL (ref 1.7–7.7)
Neutrophils Relative %: 75 % (ref 43–77)
PLATELETS: 96 10*3/uL — AB (ref 150–400)
RBC: 3.69 MIL/uL — AB (ref 3.87–5.11)
RDW: 12.9 % (ref 11.5–15.5)
WBC: 3 10*3/uL — AB (ref 4.0–10.5)

## 2013-10-29 LAB — LACTIC ACID, PLASMA: LACTIC ACID, VENOUS: 1.2 mmol/L (ref 0.5–2.2)

## 2013-10-29 LAB — TROPONIN I: Troponin I: <0.3 ng/mL (ref <0.30)

## 2013-10-29 LAB — BASIC METABOLIC PANEL
BUN: 16 mg/dL (ref 6–23)
CALCIUM: 9.2 mg/dL (ref 8.4–10.5)
CHLORIDE: 103 meq/L (ref 96–112)
CO2: 22 meq/L (ref 19–32)
Creatinine, Ser: 0.65 mg/dL (ref 0.50–1.10)
GFR calc non Af Amer: 82 mL/min — ABNORMAL LOW (ref >90)
Glucose, Bld: 128 mg/dL — ABNORMAL HIGH (ref 70–99)
Potassium: 4.1 mEq/L (ref 3.7–5.3)
SODIUM: 139 meq/L (ref 137–147)

## 2013-10-29 MED ORDER — DEXTROSE 5 % IV SOLN
1.0000 g | Freq: Once | INTRAVENOUS | Status: AC
  Administered 2013-10-29: 1 g via INTRAVENOUS
  Filled 2013-10-29: qty 10

## 2013-10-29 MED ORDER — CEPHALEXIN 500 MG PO CAPS: 500.0000 mg | ORAL_CAPSULE | Freq: Four times a day (QID) | ORAL | Status: DC

## 2013-10-29 MED ORDER — SODIUM CHLORIDE 0.9 % IV SOLN
INTRAVENOUS | Status: DC
  Administered 2013-10-29: 13:00:00 via INTRAVENOUS

## 2013-10-29 MED ORDER — ACETAMINOPHEN 325 MG PO TABS
650.0000 mg | ORAL_TABLET | Freq: Once | ORAL | Status: AC
  Administered 2013-10-29: 650 mg via ORAL
  Filled 2013-10-29: qty 2

## 2013-10-29 NOTE — ED Notes (Signed)
Patient arrives from [facility] with daughter. C/o generalized weakness, cough, congestion. Recent Urinalysis on 10/27/13, results show WBCs >30 and positive nitrites.

## 2013-10-29 NOTE — Discharge Instructions (Signed)
°Emergency Department Resource Guide °1) Find a Doctor and Pay Out of Pocket °Although you won't have to find out who is covered by your insurance plan, it is a good idea to ask around and get recommendations. You will then need to call the office and see if the doctor you have chosen will accept you as a new patient and what types of options they offer for patients who are self-pay. Some doctors offer discounts or will set up payment plans for their patients who do not have insurance, but you will need to ask so you aren't surprised when you get to your appointment. ° °2) Contact Your Local Health Department °Not all health departments have doctors that can see patients for sick visits, but many do, so it is worth a call to see if yours does. If you don't know where your local health department is, you can check in your phone book. The CDC also has a tool to help you locate your state's health department, and many state websites also have listings of all of their local health departments. ° °3) Find a Walk-in Clinic °If your illness is not likely to be very severe or complicated, you may want to try a walk in clinic. These are popping up all over the country in pharmacies, drugstores, and shopping centers. They're usually staffed by nurse practitioners or physician assistants that have been trained to treat common illnesses and complaints. They're usually fairly quick and inexpensive. However, if you have serious medical issues or chronic medical problems, these are probably not your best option. ° °No Primary Care Doctor: °- Call [facility] at  832-8000 - they can help you locate a primary care doctor that  accepts your insurance, provides certain services, etc. °- Physician Referral Service- 1-800-533-3463 ° °Chronic Pain Problems: °Organization         Address  Phone   Notes  °[facility]  (336) 297-2271 Patients need to be referred by their primary care doctor.  ° °Medication  Assistance: °Organization         Address  Phone   Notes  °[place] [facility] [address] °[place], [place] [address] (336) 641-8030 --Must be a resident of [place] °-- Must have NO insurance coverage whatsoever (no Medicaid/ Medicare, etc.) °-- The pt. MUST have a primary care doctor that directs their care regularly and follows them in the community °  °[facility]  (866) 331-1348   °[employer]  (888) 892-1162   ° °Agencies that provide inexpensive medical care: °Organization         Address  Phone   Notes  °[facility]  (336) 832-8035   °[facility]    (336) 832-7272   °[facility] [address] °[place], [place] [address] (336) 832-4777   °[facility] [address], °[place] (336) 271-4999   °[facility]    (336) 373-0678   °[facility]    (336) 272-1050   °[facility] ° [address], [place] Phone:  (336) 832-4444, Fax:  (336) 832-4440 Hours of Operation:  9 am - 6 pm, M-F.  Also accepts Medicaid/Medicare and self-pay.  °[facility] ° [address], [place] Phone: (336) 832-3150, Fax: (336) [phone]. Hours of Operation:  8:30 am - 5:30 pm, M-F.  Also accepts Medicaid and self-pay.  °[facility] [place] [address], [place] Phone: (336) 878-6027   °[facility] [address], [place], [place] (336)723-1848, Ext. 123 Mondays & Thursdays: 7-9 AM.  First 15 patients are seen on a first come, first serve basis. °  ° °Medicaid-accepting [place] Providers: ° °Organization         Address  Phone   Notes  °[facility] [address], [place] (336) 641-2100 Also accepts self-pay patients.  °[facility] [address], [place] ° (336) 856-9996   °[facility] [address], [place]  (336) 288-8857   °[facility] [address] [place] [address], [place] (336) 299-7000   °Veita Bland [address], [place]  ° (336) 373-1557 Only accepts [place] Access Medicaid patients after they have their name applied to their card.  ° °Self-Pay (no insurance) in [place]: ° °Organization         Address  Phone   Notes  °Sickle Cell Patients, [facility] [address], [place] (336) 832-1970   °[facility] [address], [place] (336) 832-4400   °[facility] ° [address], [place] (336) 992-4800   °[facility]/Dr. Osei-Bonsu ° [address] [place] [address], [place] or [address], [place] (336) 841-8500 Phone number for both [place] and [place] locations is the same.  °[facility] [address], [place] (336) 299-0000   °[facility] [address] [place] [address], [place] or [address] (336) 852-7530 °(336) 878-2260   °[facility] [address], [place] (336) 350-1642, phone; (336) 294-5005, fax Sees patients 1st and 3rd Saturday of every month.  Must not qualify for public or private insurance (i.e. Medicaid, Medicare, [place] Health Choice, Veterans' Benefits) • Household income should be no more than 200% of the poverty level •The clinic cannot treat you if you are pregnant or think you are pregnant • Sexually transmitted diseases are not treated at the clinic.  ° ° °Dental Care: °Organization         Address  Phone  Notes  °[place] [facility] [address], [place] (336) 641-6152 Accepts children up to age 21 who are enrolled in Medicaid or [place] Health Choice; pregnant women with a Medicaid card; and children who have applied for Medicaid or [place] Health Choice, but were declined, whose parents can pay a reduced fee at time of service.  °[place]  [facility] [place]  [address], [place] (336) 641-7733 Accepts children up to age 21 who are enrolled in Medicaid or [place] Health Choice; pregnant women with a Medicaid card; and children who have applied for Medicaid or [place] Health Choice, but were declined, whose parents can pay a reduced fee at time of service.  °[facility] ° [address], [place] (336) 641-4533 Patients are seen by appointment only. Walk-ins are not accepted. [facility] will see patients 18 years of age and older. °Monday - Tuesday (8am-5pm) °Most Wednesdays (8:30-5pm) °$30 per visit, cash only  °[facility] ° [address], [place] (336) 641-4533 Patients are seen by appointment only. Walk-ins are not accepted. [facility] will see patients 18 years of age and older. °One   Wednesday Evening (Monthly: Volunteer Based).  $30 per visit, cash only  °[facility]  (919) 537-3737 for adults; Children under age 4, call Graduate Pediatric Dentistry at (919) [phone]. Children aged 4-14, please call (919) [phone] to request a pediatric application. ° Dental services are provided in all areas of dental care including fillings, crowns and bridges, complete and partial dentures, implants, gum treatment, root canals, and extractions. Preventive care is also provided. Treatment is provided to both adults and children. °Patients are selected via a lottery and there is often a waiting list. °  °[facility] [address], °[place] ° (336) 763-8833 [email or web address] °  °[facility] [address], [place], [place] (336)723-1848, Ext. 123 Second and Fourth Thursday of each month, opens at 6:30 AM; Clinic ends at 9 AM.  Patients are seen on a first-come first-served basis, and a limited number are seen during each clinic.  ° °[facility] ° [address], [place], [place] (336) 723-7904    Eligibility Requirements °You must have lived in [place], [place], or [place] counties for at least the last three months. °  You cannot be eligible for state or federal sponsored healthcare insurance, including Veterans Administration, Medicaid, or Medicare. °  You generally cannot be eligible for healthcare insurance through your employer.  °  How to apply: °Eligibility screenings are held every Tuesday and Wednesday afternoon from 1:00 pm until 4:00 pm. You do not need an appointment for the interview!  °[facility] [address], [place], [place] 336-631-2330   °[facility]  336-342-8273   °[facility]  336-703-3100   °[facility]  336-570-6415   ° °Behavioral Health Resources in the Community: °Intensive Outpatient Programs °Organization         Address  Phone  Notes  °[facility] [address], [place], [place] 336-878-6098   °[facility] [address], [place], [place] 336-832-9800   °[facility] [address], [place], [place] ° 336-882-2125   °[facility] [address],  °[place], [place] 1-800-853-5163 or 336-641-4981   °Substance Abuse Resources °Organization         Address  Phone  Notes  °[facility]  336-882-2125   °[facility]  336-784-9470   °[facility]  336-285-9073   °[facility]  336-845-3988   °Residential & Outpatient Substance Abuse Program  1-800-659-3381   °Psychological Services °Organization         Address  Phone  Notes  °[facility]  336- 832-9600   °[facility]  336- 378-7881   °[facility] [address], [place] 1-800-853-5163 or 336-641-4981   ° °Mobile Crisis Teams °Organization         Address  Phone  Notes  °[facility], Mobile Crisis Care Unit  1-877-626-1772   °Assertive °[facility] ° [address]  [place], [place] 336-834-9664   °Sharon DeEsch [address] °[place] 336-554-5454   ° °Self-Help/Support Groups °Organization         Address  Phone             Notes  °[facility] of  - variety of support groups  336- 373-1402 Call for more information  °Narcotics Anonymous (NA), [facility] [address], °[place]  2 meetings at this location  ° °  Residential Treatment Programs Organization         Address  Phone  Notes  [facility] [address],    [place]  [phone]   [facility]  [address], [place] [phone], [place], [place] [phone]   [facility] [address], [place] [phone]) [phone] Admissions: 8am-3pm M-F  [facility] [address]    [place], [place] [phone]   [facility] [address], [place], [place] [phone]) [phone]   [facility] [address]  [place], [place] [phone]   [facility] Intensive Outpatient [address], [place], [place] [phone]   [facility] ([facility] [address]  [place], [place] [phone] or (440)[phone]   [facility] ([facility] [address] [place], [place] [phone] Accepts Medicaid  [facility] [address]  [place] [phone] Substance Abuse/Addiction Treatment   [facility]         Address  Phone  Notes  [facility]  (828)228-7177(888) [phone]   Angie FavaJulie Brannon, PhD [address], [address] [place], [place]   [phone](336) (863) [phone] or [phone](336) [phone]   [facility]   [address] [place], [place] [phone](336) [phone]   [facility] [address], [place], [place] [phone](336) [phone] Insurance/Medicaid/sponsorship through [facility] [address]                                    [place], [place] [phone](336) [phone] Therapy/tele-psych/case    [facility] [address]   [place], [place] [phone]) [phone]    Dr. Lolly MustacheArfeen  [phone](336) [phone]   [facility]  [employer] [facility] 1) [address], [place] 2) [address], [place] 3)  [address], [place] [phone](336) [phone](336) [phone]  [phone](336) [phone]   [facility] (912)475-8341(336) (908)672-4471 or 838-295-6479(336) [phone])[phone] (After Hours)       Take the prescription as directed.  Continue your usual prescriptions as previously directed. Call your regular medical doctor on Monday to schedule a follow up appointment within the next 2 days.  Return to the Emergency Department immediately sooner if worsening.

## 2013-10-29 NOTE — ED Notes (Signed)
Pt is a [facility] brought to er by daughter for further evaluation of generalized weakness, cough, back pain, pt is alert, confused, which daughter states is pt's normal mental status, paperwork from [facility] states that an urine sample was sent to lab on 10/27/2013, results faxed to [facility] today were nitrate positive, paperwork faxed to pt pcp but there is not anyone there to see the results until Monday,

## 2013-10-29 NOTE — ED Provider Notes (Signed)
CSN: [id number]     Arrival date & time 10/29/13  1031 History   First MD Initiated Contact with Patient 10/29/13 1129     Chief Complaint  Patient presents with  . Cough  . Nasal Congestion    Patient is a 80 y.o. female presenting with cough. The history is provided by the nursing home, the patient and a relative. The history is limited by the condition of the patient (Hx dementia).  Cough Pt was seen at 1210. Per NH report, pt's family and pt, c/o gradual onset and persistence of constant generalized weakness since yesterday. Pt's family states pt has been coughing and having a runny/stuffy nose since yesterday. NH states pt had urinalysis performed there on 10/27/13 which resulted today as "positive for a UTI." NH states "no doctor is on call" to prescribe pt an antibiotic, so pt's family was instructed to take [facility] to an [facility] or the ED for same. Pt herself has hx of dementia, currently denies any complaints.    Past Medical History  Diagnosis Date  . Depression   . Dementia   . HTN (hypertension) 07/02/2011  . DM type 2 (diabetes mellitus, type 2) 07/02/2011  . UTI (lower urinary tract infection)   . Thrombocytopenia   . Elevated LFTs   . Cerebrovascular disease    Past Surgical History  Procedure Laterality Date  . Abdominal hysterectomy    . Cholecystectomy     Family History  Problem Relation Age of Onset  . Cancer Mother   . Heart disease Father    History  Substance Use Topics  . Smoking status: Never Smoker   . Smokeless tobacco: Never Used  . Alcohol Use: No    Review of Systems  Unable to perform ROS: Dementia  Respiratory: Positive for cough.        Allergies  Review of patient's allergies indicates no known allergies.  Home Medications   Current Outpatient Rx  Name  Route  Sig  Dispense  Refill  . acetaminophen (TYLENOL) 500 MG tablet   Oral   Take 1,000 mg by mouth every 8 (eight) hours as needed.         Marland Kitchen aspirin EC 81 MG tablet   Oral  Take 81 mg by mouth daily.         . cetaphil (CETAPHIL) lotion   Topical   Apply 1 application topically 2 (two) times a week. On Wednesday and Saturday         . donepezil (ARICEPT) 5 MG tablet   Oral   Take 5 mg by mouth at bedtime.         Marland Kitchen guaiFENesin-dextromethorphan (ROBITUSSIN DM) 100-10 MG/5ML syrup   Oral   Take 5 mLs by mouth 3 (three) times daily as needed.         Marland Kitchen levothyroxine (SYNTHROID, LEVOTHROID) 25 MCG tablet   Oral   Take 25 mcg by mouth daily before breakfast.         . lisinopril (PRINIVIL,ZESTRIL) 20 MG tablet   Oral   Take 20 mg by mouth daily.         . megestrol (MEGACE) 40 MG tablet   Oral   Take 40 mg by mouth daily.         . Multiple Vitamin (DAILY VITE PO)   Oral   Take 1 tablet by mouth 2 (two) times daily.          . vitamin C (ASCORBIC ACID) 500 MG  tablet   Oral   Take 1,000 mg by mouth daily.         Marland Kitchen. zinc sulfate 220 MG capsule   Oral   Take 220 mg by mouth daily.         . cephALEXin (KEFLEX) 500 MG capsule   Oral   Take 1 capsule (500 mg total) by mouth 4 (four) times daily.   40 capsule   0    BP 155/59  Pulse 74  Temp(Src) 100.8 F (38.2 C) (Rectal)  Resp 20  Wt 145 lb (65.772 kg)  SpO2 100% Physical Exam 1215: Physical examination:  Nursing notes reviewed; Vital signs and O2 SAT reviewed;  Constitutional: Well developed, Well nourished, Well hydrated, In no acute distress; Head:  Normocephalic, atraumatic; Eyes: EOMI, PERRL, No scleral icterus; ENMT: +edemetous nasal turbinates bilat with clear rhinorrhea. Mouth and pharynx normal, Mucous membranes moist; Neck: Supple, Full range of motion, No lymphadenopathy; Cardiovascular: Regular rate and rhythm, No gallop; Respiratory: Breath sounds clear & equal bilaterally, No wheezes.  Speaking full sentences with ease, Normal respiratory effort/excursion; Chest: Nontender, Movement normal; Abdomen: Soft, Nontender, Nondistended, Normal bowel sounds;  Genitourinary: No CVA tenderness; Extremities: Pulses normal, No tenderness, No edema, No calf edema or asymmetry.; Neuro: Awake, alert, confused re: time, place, events per hx dementia. Major CN grossly intact.  Speech clear. Moves all extremities on stretcher spontaneously without apparent gross focal motor deficits.; Skin: Color normal, Warm, Dry.   ED Course  Procedures   EKG Interpretation    Date/Time:  Saturday October 29 2013 13:34:05 EST Ventricular Rate:  68 PR Interval:  162 QRS Duration: 102 QT Interval:  372 QTC Calculation: 395 R Axis:   -55 Text Interpretation:  Normal sinus rhythm Left axis deviation Baseline wander Artifact Abnormal ECG When compared with ECG of 02-Jul-2011 13:41, Left bundle branch block is no longer Present Confirmed by [facility]  MD, Nicholos JohnsKATHLEEN [phone]([id number]) on 10/29/2013 2:35:47 PM            MDM  MDM Reviewed: previous chart, nursing note and vitals Reviewed previous: labs and ECG Interpretation: labs, ECG and x-ray     Results for orders placed during the hospital encounter of 10/29/13  URINALYSIS W MICROSCOPIC + REFLEX CULTURE      Result Value Range   Color, Urine YELLOW  YELLOW   APPearance CLOUDY (*) CLEAR   Specific Gravity, Urine >1.030 (*) 1.005 - 1.030   pH 6.0  5.0 - 8.0   Glucose, UA NEGATIVE  NEGATIVE mg/dL   Hgb urine dipstick SMALL (*) NEGATIVE   Bilirubin Urine NEGATIVE  NEGATIVE   Ketones, ur 40 (*) NEGATIVE mg/dL   Protein, ur NEGATIVE  NEGATIVE mg/dL   Urobilinogen, UA 0.2  0.0 - 1.0 mg/dL   Nitrite POSITIVE (*) NEGATIVE   Leukocytes, UA SMALL (*) NEGATIVE   WBC, UA TOO NUMEROUS TO COUNT  <3 WBC/hpf   RBC / HPF 3-6  <3 RBC/hpf   Bacteria, UA MANY (*) RARE  BASIC METABOLIC PANEL      Result Value Range   Sodium 139  137 - 147 mEq/L   Potassium 4.1  3.7 - 5.3 mEq/L   Chloride 103  96 - 112 mEq/L   CO2 22  19 - 32 mEq/L   Glucose, Bld 128 (*) 70 - 99 mg/dL   BUN 16  6 - 23 mg/dL   Creatinine, Ser 1.190.65  0.50 - 1.10  mg/dL   Calcium 9.2  8.4 - 14.710.5  mg/dL   GFR calc non Af Amer 82 (*) >90 mL/min   GFR calc Af Amer >90  >90 mL/min  CBC WITH DIFFERENTIAL      Result Value Range   WBC 3.0 (*) 4.0 - 10.5 K/uL   RBC 3.69 (*) 3.87 - 5.11 MIL/uL   Hemoglobin 12.2  12.0 - 15.0 g/dL   HCT 16.1 (*) 09.6 - 04.5 %   MCV 95.4  78.0 - 100.0 fL   MCH 33.1  26.0 - 34.0 pg   MCHC 34.7  30.0 - 36.0 g/dL   RDW 40.9  81.1 - 91.4 %   Platelets 96 (*) 150 - 400 K/uL   Neutrophils Relative % 75  43 - 77 %   Lymphocytes Relative 14  12 - 46 %   Monocytes Relative 10  3 - 12 %   Eosinophils Relative 1  0 - 5 %   Basophils Relative 0  0 - 1 %   Neutro Abs 2.3  1.7 - 7.7 K/uL   Lymphs Abs 0.4 (*) 0.7 - 4.0 K/uL   Monocytes Absolute 0.3  0.1 - 1.0 K/uL   Eosinophils Absolute 0.0  0.0 - 0.7 K/uL   Basophils Absolute 0.0  0.0 - 0.1 K/uL   Smear Review PLATELET COUNT CONFIRMED BY SMEAR    TROPONIN I      Result Value Range   Troponin I <0.30  <0.30 ng/mL  LACTIC ACID, PLASMA      Result Value Range   Lactic Acid, Venous 1.2  0.5 - 2.2 mmol/L   Dg Chest 2 View 10/29/2013   CLINICAL DATA:  Coughing and congestion.  EXAM: CHEST  2 VIEW  COMPARISON:  07/02/2011  FINDINGS: Lungs are clear bilaterally. Heart and mediastinum are within normal limits. No evidence for pleural effusions. There may be a mild compression deformity in the upper thoracic spine of unknown age.  IMPRESSION: No acute cardiopulmonary disease.  Question a mild compression deformity in the upper thoracic spine of unknown age.   Electronically Signed   By: Richarda Overlie M.D.   On: 10/29/2013 13:17    1540:  Pt is not orthostatic. Tol PO well while in the ED without N/V. APAP given for fever, otherwise VS remain stable. Platelets per baseline. WBC count per baseline, lactic acid normal. +UTI, UC pending; will dose IV rocephin and rx keflex. No clear indication for admission at this time, will d/c back to the NH with outpt f/u on Monday. D/W family at bedside  regarding same; family would like pt sent back to NH and are agreeable with this plan. Dx and testing d/w pt and family.  Questions answered.  Verb understanding, agreeable to d/c home with outpt f/u.      Laray Anger, DO 11/01/13 1148

## 2013-10-29 NOTE — ED Notes (Signed)
Report given to Aggie Cosierheresa at [facility], pt and daughter updated on plan of care,

## 2013-10-31 LAB — URINE CULTURE

## 2013-11-01 DIAGNOSIS — R262 Difficulty in walking, not elsewhere classified: Secondary | ICD-10-CM | POA: Diagnosis not present

## 2013-11-01 DIAGNOSIS — I10 Essential (primary) hypertension: Secondary | ICD-10-CM | POA: Diagnosis not present

## 2013-11-01 DIAGNOSIS — N390 Urinary tract infection, site not specified: Secondary | ICD-10-CM | POA: Diagnosis not present

## 2013-11-01 DIAGNOSIS — M6281 Muscle weakness (generalized): Secondary | ICD-10-CM | POA: Diagnosis not present

## 2013-11-01 DIAGNOSIS — F0390 Unspecified dementia without behavioral disturbance: Secondary | ICD-10-CM | POA: Diagnosis not present

## 2013-11-01 DIAGNOSIS — E119 Type 2 diabetes mellitus without complications: Secondary | ICD-10-CM | POA: Diagnosis not present

## 2013-11-01 DIAGNOSIS — I519 Heart disease, unspecified: Secondary | ICD-10-CM | POA: Diagnosis not present

## 2013-11-02 DIAGNOSIS — M6281 Muscle weakness (generalized): Secondary | ICD-10-CM | POA: Diagnosis not present

## 2013-11-02 DIAGNOSIS — E119 Type 2 diabetes mellitus without complications: Secondary | ICD-10-CM | POA: Diagnosis not present

## 2013-11-02 DIAGNOSIS — N390 Urinary tract infection, site not specified: Secondary | ICD-10-CM | POA: Diagnosis not present

## 2013-11-02 DIAGNOSIS — R262 Difficulty in walking, not elsewhere classified: Secondary | ICD-10-CM | POA: Diagnosis not present

## 2013-11-02 DIAGNOSIS — F0390 Unspecified dementia without behavioral disturbance: Secondary | ICD-10-CM | POA: Diagnosis not present

## 2013-11-02 DIAGNOSIS — I10 Essential (primary) hypertension: Secondary | ICD-10-CM | POA: Diagnosis not present

## 2013-11-04 DIAGNOSIS — I10 Essential (primary) hypertension: Secondary | ICD-10-CM | POA: Diagnosis not present

## 2013-11-04 DIAGNOSIS — M6281 Muscle weakness (generalized): Secondary | ICD-10-CM | POA: Diagnosis not present

## 2013-11-04 DIAGNOSIS — F0390 Unspecified dementia without behavioral disturbance: Secondary | ICD-10-CM | POA: Diagnosis not present

## 2013-11-04 DIAGNOSIS — N390 Urinary tract infection, site not specified: Secondary | ICD-10-CM | POA: Diagnosis not present

## 2013-11-04 DIAGNOSIS — E119 Type 2 diabetes mellitus without complications: Secondary | ICD-10-CM | POA: Diagnosis not present

## 2013-11-04 DIAGNOSIS — R262 Difficulty in walking, not elsewhere classified: Secondary | ICD-10-CM | POA: Diagnosis not present

## 2013-11-07 DIAGNOSIS — R262 Difficulty in walking, not elsewhere classified: Secondary | ICD-10-CM | POA: Diagnosis not present

## 2013-11-07 DIAGNOSIS — M6281 Muscle weakness (generalized): Secondary | ICD-10-CM | POA: Diagnosis not present

## 2013-11-07 DIAGNOSIS — I10 Essential (primary) hypertension: Secondary | ICD-10-CM | POA: Diagnosis not present

## 2013-11-07 DIAGNOSIS — E119 Type 2 diabetes mellitus without complications: Secondary | ICD-10-CM | POA: Diagnosis not present

## 2013-11-07 DIAGNOSIS — F0390 Unspecified dementia without behavioral disturbance: Secondary | ICD-10-CM | POA: Diagnosis not present

## 2013-11-07 DIAGNOSIS — N390 Urinary tract infection, site not specified: Secondary | ICD-10-CM | POA: Diagnosis not present

## 2013-11-09 DIAGNOSIS — F0390 Unspecified dementia without behavioral disturbance: Secondary | ICD-10-CM | POA: Diagnosis not present

## 2013-11-09 DIAGNOSIS — R262 Difficulty in walking, not elsewhere classified: Secondary | ICD-10-CM | POA: Diagnosis not present

## 2013-11-09 DIAGNOSIS — M6281 Muscle weakness (generalized): Secondary | ICD-10-CM | POA: Diagnosis not present

## 2013-11-09 DIAGNOSIS — I10 Essential (primary) hypertension: Secondary | ICD-10-CM | POA: Diagnosis not present

## 2013-11-09 DIAGNOSIS — N390 Urinary tract infection, site not specified: Secondary | ICD-10-CM | POA: Diagnosis not present

## 2013-11-09 DIAGNOSIS — E119 Type 2 diabetes mellitus without complications: Secondary | ICD-10-CM | POA: Diagnosis not present

## 2013-11-11 DIAGNOSIS — M6281 Muscle weakness (generalized): Secondary | ICD-10-CM | POA: Diagnosis not present

## 2013-11-11 DIAGNOSIS — E119 Type 2 diabetes mellitus without complications: Secondary | ICD-10-CM | POA: Diagnosis not present

## 2013-11-11 DIAGNOSIS — R262 Difficulty in walking, not elsewhere classified: Secondary | ICD-10-CM | POA: Diagnosis not present

## 2013-11-11 DIAGNOSIS — F0390 Unspecified dementia without behavioral disturbance: Secondary | ICD-10-CM | POA: Diagnosis not present

## 2013-11-11 DIAGNOSIS — I10 Essential (primary) hypertension: Secondary | ICD-10-CM | POA: Diagnosis not present

## 2013-11-11 DIAGNOSIS — N390 Urinary tract infection, site not specified: Secondary | ICD-10-CM | POA: Diagnosis not present

## 2013-11-15 DIAGNOSIS — F0390 Unspecified dementia without behavioral disturbance: Secondary | ICD-10-CM | POA: Diagnosis not present

## 2013-11-15 DIAGNOSIS — I10 Essential (primary) hypertension: Secondary | ICD-10-CM | POA: Diagnosis not present

## 2013-11-15 DIAGNOSIS — E119 Type 2 diabetes mellitus without complications: Secondary | ICD-10-CM | POA: Diagnosis not present

## 2013-11-15 DIAGNOSIS — R262 Difficulty in walking, not elsewhere classified: Secondary | ICD-10-CM | POA: Diagnosis not present

## 2013-11-15 DIAGNOSIS — N390 Urinary tract infection, site not specified: Secondary | ICD-10-CM | POA: Diagnosis not present

## 2013-11-15 DIAGNOSIS — M6281 Muscle weakness (generalized): Secondary | ICD-10-CM | POA: Diagnosis not present

## 2013-11-16 DIAGNOSIS — M6281 Muscle weakness (generalized): Secondary | ICD-10-CM | POA: Diagnosis not present

## 2013-11-16 DIAGNOSIS — N390 Urinary tract infection, site not specified: Secondary | ICD-10-CM | POA: Diagnosis not present

## 2013-11-16 DIAGNOSIS — F0390 Unspecified dementia without behavioral disturbance: Secondary | ICD-10-CM | POA: Diagnosis not present

## 2013-11-16 DIAGNOSIS — R262 Difficulty in walking, not elsewhere classified: Secondary | ICD-10-CM | POA: Diagnosis not present

## 2013-11-16 DIAGNOSIS — I10 Essential (primary) hypertension: Secondary | ICD-10-CM | POA: Diagnosis not present

## 2013-11-16 DIAGNOSIS — E119 Type 2 diabetes mellitus without complications: Secondary | ICD-10-CM | POA: Diagnosis not present

## 2013-11-18 DIAGNOSIS — E119 Type 2 diabetes mellitus without complications: Secondary | ICD-10-CM | POA: Diagnosis not present

## 2013-11-18 DIAGNOSIS — N390 Urinary tract infection, site not specified: Secondary | ICD-10-CM | POA: Diagnosis not present

## 2013-11-18 DIAGNOSIS — F0390 Unspecified dementia without behavioral disturbance: Secondary | ICD-10-CM | POA: Diagnosis not present

## 2013-11-18 DIAGNOSIS — M6281 Muscle weakness (generalized): Secondary | ICD-10-CM | POA: Diagnosis not present

## 2013-11-18 DIAGNOSIS — R262 Difficulty in walking, not elsewhere classified: Secondary | ICD-10-CM | POA: Diagnosis not present

## 2013-11-18 DIAGNOSIS — I10 Essential (primary) hypertension: Secondary | ICD-10-CM | POA: Diagnosis not present

## 2013-11-22 DIAGNOSIS — R05 Cough: Secondary | ICD-10-CM | POA: Diagnosis not present

## 2013-11-22 DIAGNOSIS — R059 Cough, unspecified: Secondary | ICD-10-CM | POA: Diagnosis not present

## 2013-11-24 DIAGNOSIS — M6281 Muscle weakness (generalized): Secondary | ICD-10-CM | POA: Diagnosis not present

## 2013-11-24 DIAGNOSIS — R262 Difficulty in walking, not elsewhere classified: Secondary | ICD-10-CM | POA: Diagnosis not present

## 2013-11-24 DIAGNOSIS — N390 Urinary tract infection, site not specified: Secondary | ICD-10-CM | POA: Diagnosis not present

## 2013-11-24 DIAGNOSIS — I10 Essential (primary) hypertension: Secondary | ICD-10-CM | POA: Diagnosis not present

## 2013-11-24 DIAGNOSIS — F0390 Unspecified dementia without behavioral disturbance: Secondary | ICD-10-CM | POA: Diagnosis not present

## 2013-11-24 DIAGNOSIS — E119 Type 2 diabetes mellitus without complications: Secondary | ICD-10-CM | POA: Diagnosis not present

## 2013-11-25 DIAGNOSIS — M6281 Muscle weakness (generalized): Secondary | ICD-10-CM | POA: Diagnosis not present

## 2013-11-25 DIAGNOSIS — R262 Difficulty in walking, not elsewhere classified: Secondary | ICD-10-CM | POA: Diagnosis not present

## 2013-11-25 DIAGNOSIS — F0390 Unspecified dementia without behavioral disturbance: Secondary | ICD-10-CM | POA: Diagnosis not present

## 2013-11-25 DIAGNOSIS — N390 Urinary tract infection, site not specified: Secondary | ICD-10-CM | POA: Diagnosis not present

## 2013-11-25 DIAGNOSIS — E119 Type 2 diabetes mellitus without complications: Secondary | ICD-10-CM | POA: Diagnosis not present

## 2013-11-25 DIAGNOSIS — I10 Essential (primary) hypertension: Secondary | ICD-10-CM | POA: Diagnosis not present

## 2013-11-28 DIAGNOSIS — N390 Urinary tract infection, site not specified: Secondary | ICD-10-CM | POA: Diagnosis not present

## 2013-11-28 DIAGNOSIS — E119 Type 2 diabetes mellitus without complications: Secondary | ICD-10-CM | POA: Diagnosis not present

## 2013-11-28 DIAGNOSIS — R262 Difficulty in walking, not elsewhere classified: Secondary | ICD-10-CM | POA: Diagnosis not present

## 2013-11-28 DIAGNOSIS — I10 Essential (primary) hypertension: Secondary | ICD-10-CM | POA: Diagnosis not present

## 2013-11-28 DIAGNOSIS — M6281 Muscle weakness (generalized): Secondary | ICD-10-CM | POA: Diagnosis not present

## 2013-11-28 DIAGNOSIS — F0390 Unspecified dementia without behavioral disturbance: Secondary | ICD-10-CM | POA: Diagnosis not present

## 2013-12-01 DIAGNOSIS — I10 Essential (primary) hypertension: Secondary | ICD-10-CM | POA: Diagnosis not present

## 2013-12-01 DIAGNOSIS — E119 Type 2 diabetes mellitus without complications: Secondary | ICD-10-CM | POA: Diagnosis not present

## 2013-12-01 DIAGNOSIS — M6281 Muscle weakness (generalized): Secondary | ICD-10-CM | POA: Diagnosis not present

## 2013-12-01 DIAGNOSIS — R262 Difficulty in walking, not elsewhere classified: Secondary | ICD-10-CM | POA: Diagnosis not present

## 2013-12-01 DIAGNOSIS — N390 Urinary tract infection, site not specified: Secondary | ICD-10-CM | POA: Diagnosis not present

## 2013-12-01 DIAGNOSIS — F0390 Unspecified dementia without behavioral disturbance: Secondary | ICD-10-CM | POA: Diagnosis not present

## 2013-12-05 DIAGNOSIS — M6281 Muscle weakness (generalized): Secondary | ICD-10-CM | POA: Diagnosis not present

## 2013-12-05 DIAGNOSIS — E119 Type 2 diabetes mellitus without complications: Secondary | ICD-10-CM | POA: Diagnosis not present

## 2013-12-05 DIAGNOSIS — I10 Essential (primary) hypertension: Secondary | ICD-10-CM | POA: Diagnosis not present

## 2013-12-05 DIAGNOSIS — R262 Difficulty in walking, not elsewhere classified: Secondary | ICD-10-CM | POA: Diagnosis not present

## 2013-12-05 DIAGNOSIS — N390 Urinary tract infection, site not specified: Secondary | ICD-10-CM | POA: Diagnosis not present

## 2013-12-05 DIAGNOSIS — F0390 Unspecified dementia without behavioral disturbance: Secondary | ICD-10-CM | POA: Diagnosis not present

## 2013-12-08 DIAGNOSIS — M6281 Muscle weakness (generalized): Secondary | ICD-10-CM | POA: Diagnosis not present

## 2013-12-08 DIAGNOSIS — E119 Type 2 diabetes mellitus without complications: Secondary | ICD-10-CM | POA: Diagnosis not present

## 2013-12-08 DIAGNOSIS — F0390 Unspecified dementia without behavioral disturbance: Secondary | ICD-10-CM | POA: Diagnosis not present

## 2013-12-08 DIAGNOSIS — R262 Difficulty in walking, not elsewhere classified: Secondary | ICD-10-CM | POA: Diagnosis not present

## 2013-12-08 DIAGNOSIS — N390 Urinary tract infection, site not specified: Secondary | ICD-10-CM | POA: Diagnosis not present

## 2013-12-08 DIAGNOSIS — I10 Essential (primary) hypertension: Secondary | ICD-10-CM | POA: Diagnosis not present

## 2014-02-22 DIAGNOSIS — E1159 Type 2 diabetes mellitus with other circulatory complications: Secondary | ICD-10-CM | POA: Diagnosis not present

## 2014-03-04 ENCOUNTER — Encounter (HOSPITAL_COMMUNITY): Payer: Self-pay | Admitting: Emergency Medicine

## 2014-03-04 ENCOUNTER — Emergency Department (HOSPITAL_COMMUNITY)
Admission: EM | Admit: 2014-03-04 | Discharge: 2014-03-04 | Disposition: A | Discharge status: Home / Self Care | Payer: Medicare Other | Attending: Emergency Medicine | Admitting: Emergency Medicine

## 2014-03-04 ENCOUNTER — Emergency Department (HOSPITAL_COMMUNITY): Payer: Medicare Other

## 2014-03-04 DIAGNOSIS — I10 Essential (primary) hypertension: Secondary | ICD-10-CM | POA: Diagnosis not present

## 2014-03-04 DIAGNOSIS — F0390 Unspecified dementia without behavioral disturbance: Secondary | ICD-10-CM | POA: Diagnosis not present

## 2014-03-04 DIAGNOSIS — M545 Low back pain, unspecified: Secondary | ICD-10-CM | POA: Diagnosis not present

## 2014-03-04 DIAGNOSIS — M47817 Spondylosis without myelopathy or radiculopathy, lumbosacral region: Secondary | ICD-10-CM | POA: Diagnosis not present

## 2014-03-04 DIAGNOSIS — E119 Type 2 diabetes mellitus without complications: Secondary | ICD-10-CM | POA: Insufficient documentation

## 2014-03-04 DIAGNOSIS — I679 Cerebrovascular disease, unspecified: Secondary | ICD-10-CM | POA: Insufficient documentation

## 2014-03-04 DIAGNOSIS — N390 Urinary tract infection, site not specified: Secondary | ICD-10-CM | POA: Diagnosis not present

## 2014-03-04 DIAGNOSIS — Z7982 Long term (current) use of aspirin: Secondary | ICD-10-CM | POA: Insufficient documentation

## 2014-03-04 DIAGNOSIS — M549 Dorsalgia, unspecified: Secondary | ICD-10-CM | POA: Diagnosis not present

## 2014-03-04 DIAGNOSIS — Z79899 Other long term (current) drug therapy: Secondary | ICD-10-CM | POA: Diagnosis not present

## 2014-03-04 DIAGNOSIS — Z862 Personal history of diseases of the blood and blood-forming organs and certain disorders involving the immune mechanism: Secondary | ICD-10-CM | POA: Diagnosis not present

## 2014-03-04 DIAGNOSIS — R6889 Other general symptoms and signs: Secondary | ICD-10-CM | POA: Diagnosis not present

## 2014-03-04 LAB — BASIC METABOLIC PANEL
BUN: 20 mg/dL (ref 6–23)
CHLORIDE: 104 meq/L (ref 96–112)
CO2: 20 meq/L (ref 19–32)
CREATININE: 0.71 mg/dL (ref 0.50–1.10)
Calcium: 9.5 mg/dL (ref 8.4–10.5)
GFR calc non Af Amer: 79 mL/min — ABNORMAL LOW (ref >90)
Glucose, Bld: 100 mg/dL — ABNORMAL HIGH (ref 70–99)
POTASSIUM: 4.4 meq/L (ref 3.7–5.3)
SODIUM: 140 meq/L (ref 137–147)

## 2014-03-04 LAB — CBC
HCT: 37.5 % (ref 36.0–46.0)
HEMOGLOBIN: 13 g/dL (ref 12.0–15.0)
MCH: 33.3 pg (ref 26.0–34.0)
MCHC: 34.7 g/dL (ref 30.0–36.0)
MCV: 96.2 fL (ref 78.0–100.0)
Platelets: 109 10*3/uL — ABNORMAL LOW (ref 150–400)
RBC: 3.9 MIL/uL (ref 3.87–5.11)
RDW: 13.1 % (ref 11.5–15.5)
WBC: 6.6 10*3/uL (ref 4.0–10.5)

## 2014-03-04 LAB — URINALYSIS, ROUTINE W REFLEX MICROSCOPIC
Bilirubin Urine: NEGATIVE
GLUCOSE, UA: NEGATIVE mg/dL
Nitrite: POSITIVE — AB
PH: 5.5 (ref 5.0–8.0)
PROTEIN: NEGATIVE mg/dL
Urobilinogen, UA: 0.2 mg/dL (ref 0.0–1.0)

## 2014-03-04 LAB — URINE MICROSCOPIC-ADD ON

## 2014-03-04 LAB — CBG MONITORING, ED: GLUCOSE-CAPILLARY: 86 mg/dL (ref 70–99)

## 2014-03-04 MED ORDER — CEFUROXIME AXETIL 250 MG PO TABS
250.0000 mg | ORAL_TABLET | Freq: Once | ORAL | Status: AC
  Administered 2014-03-04: 250 mg via ORAL
  Filled 2014-03-04: qty 1

## 2014-03-04 MED ORDER — CEFUROXIME AXETIL 250 MG PO TABS
250.0000 mg | ORAL_TABLET | Freq: Two times a day (BID) | ORAL | Status: DC
Start: 2014-03-04 — End: 2015-02-05

## 2014-03-04 NOTE — ED Notes (Signed)
EMS [facility] pt c/o back pain. Per EMS staff reports, " she normally has back pain when she has a UTI" hx of dementia. Pt poor historian

## 2014-03-04 NOTE — ED Provider Notes (Signed)
LEVEL 5 CAVEAT: DEMENTIA CSN: [id number]     Arrival date & time    History  This chart was scribed for Doug Sou, MD,  by Ashley Jacobs, ED Scribe. The patient was seen in room APA03/APA03 and the patient's care was started at 11:29 AM.   First MD Initiated Contact with Patient 03/04/14 1119     Chief Complaint  Patient presents with  . Back Pain   Is obtained from patient's daughter  (Consider location/radiation/quality/duration/timing/severity/associated sxs/prior Treatment) HPI HPI Comments: Jennifer Kerr is a 80 y.o. female with hx of dementia who presents to the Emergency Department via EMS [facility] complaining of back pain. Per EMS, pt typically complains of back pain when she has a UTI infection. When showering today pt complained of back pain. The pain seems to be exacerbated with any kind of movement.  Pt has a hx of DM (insuline dependent). She is normally able to walk with a walker. Patient's daughter reports she's chronically confused and looked at her baseline presently. Patient declines back pain presently. Past Medical History  Diagnosis Date  . Depression   . Dementia   . HTN (hypertension) 07/02/2011  . DM type 2 (diabetes mellitus, type 2) 07/02/2011  . UTI (lower urinary tract infection)   . Thrombocytopenia   . Elevated LFTs   . Cerebrovascular disease    Past Surgical History  Procedure Laterality Date  . Abdominal hysterectomy    . Cholecystectomy     Family History  Problem Relation Age of Onset  . Cancer Mother   . Heart disease Father    History  Substance Use Topics  . Smoking status: Never Smoker   . Smokeless tobacco: Never Used  . Alcohol Use: No   OB History   Grav Para Term Preterm Abortions TAB SAB Ect Mult Living                 Review of Systems  Unable to perform ROS: Dementia      Allergies  Review of patient's allergies indicates no known allergies.  Home Medications   Prior to Admission medications    Medication Sig Start Date End Date Taking? Authorizing Provider  acetaminophen (TYLENOL) 500 MG tablet Take 1,000 mg by mouth every 8 (eight) hours as needed.    Historical Provider, MD  aspirin EC 81 MG tablet Take 81 mg by mouth daily.    Historical Provider, MD  cephALEXin (KEFLEX) 500 MG capsule Take 1 capsule (500 mg total) by mouth 4 (four) times daily. 10/29/13   Laray Anger, DO  cetaphil (CETAPHIL) lotion Apply 1 application topically 2 (two) times a week. On Wednesday and Saturday    Historical Provider, MD  donepezil (ARICEPT) 5 MG tablet Take 5 mg by mouth at bedtime.    Historical Provider, MD  guaiFENesin-dextromethorphan (ROBITUSSIN DM) 100-10 MG/5ML syrup Take 5 mLs by mouth 3 (three) times daily as needed.    Historical Provider, MD  levothyroxine (SYNTHROID, LEVOTHROID) 25 MCG tablet Take 25 mcg by mouth daily before breakfast.    Historical Provider, MD  lisinopril (PRINIVIL,ZESTRIL) 20 MG tablet Take 20 mg by mouth daily.    Historical Provider, MD  megestrol (MEGACE) 40 MG tablet Take 40 mg by mouth daily.    Historical Provider, MD  Multiple Vitamin (DAILY VITE PO) Take 1 tablet by mouth 2 (two) times daily.     Historical Provider, MD  vitamin C (ASCORBIC ACID) 500 MG tablet Take 1,000 mg by  mouth daily.    Historical Provider, MD  zinc sulfate 220 MG capsule Take 220 mg by mouth daily.    Historical Provider, MD   BP 153/123  Pulse 68  Temp(Src) 97.8 F (36.6 C)  Resp 20  SpO2 99% Physical Exam  Nursing note and vitals reviewed. Constitutional: She appears well-developed and well-nourished.  HENT:  Head: Normocephalic and atraumatic.  Eyes: Conjunctivae are normal. Pupils are equal, round, and reactive to light.  Neck: Normal range of motion. Neck supple. No tracheal deviation present. No thyromegaly present.  Cardiovascular: Normal rate and regular rhythm.   No murmur heard. Pulmonary/Chest: Effort normal and breath sounds normal.  Abdominal: Soft. Bowel  sounds are normal. She exhibits no distension. There is no tenderness.  Musculoskeletal: Normal range of motion. She exhibits no edema and no tenderness.  Entire spine is nontender. All 4 extremities  without contusion abrasion or tenderness neurovascularly intact  Neurological: She is alert. Coordination normal.  Skin: Skin is warm and dry. No rash noted.  Psychiatric: She has a normal mood and affect.    ED Course  Procedures (including critical care time) Labs Review Labs Reviewed  URINALYSIS, ROUTINE W REFLEX MICROSCOPIC  CBG MONITORING, ED    Imaging Review No results found.   EKG Interpretation None     X-rays viewed by me Results for orders placed during the hospital encounter of 03/04/14  URINALYSIS, ROUTINE W REFLEX MICROSCOPIC      Result Value Ref Range   Color, Urine AMBER (*) YELLOW   APPearance CLOUDY (*) CLEAR   Specific Gravity, Urine >1.030 (*) 1.005 - 1.030   pH 5.5  5.0 - 8.0   Glucose, UA NEGATIVE  NEGATIVE mg/dL   Hgb urine dipstick SMALL (*) NEGATIVE   Bilirubin Urine NEGATIVE  NEGATIVE   Ketones, ur TRACE (*) NEGATIVE mg/dL   Protein, ur NEGATIVE  NEGATIVE mg/dL   Urobilinogen, UA 0.2  0.0 - 1.0 mg/dL   Nitrite POSITIVE (*) NEGATIVE   Leukocytes, UA SMALL (*) NEGATIVE  BASIC METABOLIC PANEL      Result Value Ref Range   Sodium 140  137 - 147 mEq/L   Potassium 4.4  3.7 - 5.3 mEq/L   Chloride 104  96 - 112 mEq/L   CO2 20  19 - 32 mEq/L   Glucose, Bld 100 (*) 70 - 99 mg/dL   BUN 20  6 - 23 mg/dL   Creatinine, Ser 1.610.71  0.50 - 1.10 mg/dL   Calcium 9.5  8.4 - 09.610.5 mg/dL   GFR calc non Af Amer 79 (*) >90 mL/min   GFR calc Af Amer >90  >90 mL/min  URINE MICROSCOPIC-ADD ON      Result Value Ref Range   WBC, UA TOO NUMEROUS TO COUNT  <3 WBC/hpf   RBC / HPF 3-6  <3 RBC/hpf   Bacteria, UA MANY (*) RARE  CBC      Result Value Ref Range   WBC 6.6  4.0 - 10.5 K/uL   RBC 3.90  3.87 - 5.11 MIL/uL   Hemoglobin 13.0  12.0 - 15.0 g/dL   HCT 04.537.5  40.936.0  - 81.146.0 %   MCV 96.2  78.0 - 100.0 fL   MCH 33.3  26.0 - 34.0 pg   MCHC 34.7  30.0 - 36.0 g/dL   RDW 91.413.1  78.211.5 - 95.615.5 %   Platelets 109 (*) 150 - 400 K/uL  CBG MONITORING, ED      Result Value  Ref Range   Glucose-Capillary 86  70 - 99 mg/dL   Dg Thoracic Spine 2 View  03/04/2014   CLINICAL DATA:  Dementia.  Back pain.  No known injury.  EXAM: THORACIC SPINE - 2 VIEW  COMPARISON:  Chest x-ray 05/18/2014  FINDINGS: There is a mild wedge-shaped compression fracture in the upper to mid thoracic spine which is stable since February. No new fracture. Alignment is normal.  IMPRESSION: Stable mild wedge compression fracture in the upper to mid thoracic spine. No acute findings.   Electronically Signed   By: Charlett NoseKevin  Dover M.D.   On: 03/04/2014 13:05   Dg Lumbar Spine Complete  03/04/2014   CLINICAL DATA:  Back pain  EXAM: LUMBAR SPINE - COMPLETE 4+ VIEW  COMPARISON:  07/02/2011  FINDINGS: There is underpenetration and osteopenia which decreases the sensitivity of radiography.  No acute fracture is suspected. No evidence of osseous erosion or destruction. No vertebral body height loss compared to prior and accounting for differences in obliquity. No cortical disruption identified. There is no subluxation. There is diffuse mild for age spondylotic endplate spurring and disc narrowing. Disc narrowing is especially notable at L2-3.  IMPRESSION: 1. No acute osseous findings. 2. Diffuse lumbar spondylosis.   Electronically Signed   By: Tiburcio PeaJonathan  Watts M.D.   On: 03/04/2014 13:09    MDM  Plan urine culture,  prescription Ceftin. No gallop acute nephritis. No vomiting. No fever no leukocytos Final diagnoses:  None  Dx #1 back pain #2 urinary tract infection I personally performed the services described in this documentation, which was scribed in my presence. The recorded information has been reviewed and considered.    Doug SouSam Erva Koke, MD 03/04/14 1348

## 2014-03-04 NOTE — ED Notes (Signed)
[facility]. Informed of UTI and Rx sent back. [facility]

## 2014-03-04 NOTE — Discharge Instructions (Signed)
Urinary Tract Infection Urine has been sent for culture. We will call if antibiotic needs to be changed Urinary tract infections (UTIs) can develop anywhere along your urinary tract. Your urinary tract is your body's drainage system for removing wastes and extra water. Your urinary tract includes two kidneys, two ureters, a bladder, and a urethra. Your kidneys are a pair of bean-shaped organs. Each kidney is about the size of your fist. They are located below your ribs, one on each side of your spine. CAUSES Infections are caused by microbes, which are microscopic organisms, including fungi, viruses, and bacteria. These organisms are so small that they can only be seen through a microscope. Bacteria are the microbes that most commonly cause UTIs. SYMPTOMS  Symptoms of UTIs may vary by age and gender of the patient and by the location of the infection. Symptoms in young women typically include a frequent and intense urge to urinate and a painful, burning feeling in the bladder or urethra during urination. Older women and men are more likely to be tired, shaky, and weak and have muscle aches and abdominal pain. A fever may mean the infection is in your kidneys. Other symptoms of a kidney infection include pain in your back or sides below the ribs, nausea, and vomiting. DIAGNOSIS To diagnose a UTI, your caregiver will ask you about your symptoms. Your caregiver also will ask to provide a urine sample. The urine sample will be tested for bacteria and white blood cells. White blood cells are made by your body to help fight infection. TREATMENT  Typically, UTIs can be treated with medication. Because most UTIs are caused by a bacterial infection, they usually can be treated with the use of antibiotics. The choice of antibiotic and length of treatment depend on your symptoms and the type of bacteria causing your infection. HOME CARE INSTRUCTIONS  If you were prescribed antibiotics, take them exactly as your  caregiver instructs you. Finish the medication even if you feel better after you have only taken some of the medication.  Drink enough water and fluids to keep your urine clear or pale yellow.  Avoid caffeine, tea, and carbonated beverages. They tend to irritate your bladder.  Empty your bladder often. Avoid holding urine for long periods of time.  Empty your bladder before and after sexual intercourse.  After a bowel movement, women should cleanse from front to back. Use each tissue only once. SEEK MEDICAL CARE IF:   You have back pain.  You develop a fever.  Your symptoms do not begin to resolve within 3 days. SEEK IMMEDIATE MEDICAL CARE IF:   You have severe back pain or lower abdominal pain.  You develop chills.  You have nausea or vomiting.  You have continued burning or discomfort with urination. MAKE SURE YOU:   Understand these instructions.  Will watch your condition.  Will get help right away if you are not doing well or get worse. Document Released: 06/18/2005 Document Revised: 03/09/2012 Document Reviewed: 10/17/2011 [facility] Patient Information 2014 [employer] [place].

## 2014-03-08 LAB — URINE CULTURE: Colony Count: >=100000

## 2014-03-09 ENCOUNTER — Telehealth (HOSPITAL_BASED_OUTPATIENT_CLINIC_OR_DEPARTMENT_OTHER): Payer: Self-pay | Admitting: Emergency Medicine

## 2014-03-09 NOTE — Telephone Encounter (Signed)
Post ED Visit - Positive Culture Follow-up  Culture report reviewed by antimicrobial stewardship pharmacist: []  Wes Dulaney, Pharm.D., BCPS []  Celedonio MiyamotoJeremy Frens, Pharm.D., BCPS []  Georgina PillionElizabeth Martin, Pharm.D., BCPS []  TripoliMinh Pham, [address]Pharm.D., BCPS, AAHIVP []  Estella HuskMichelle Turner, Pharm.D., BCPS, AAHIVP []  Harvie JuniorNathan Cope, Pharm.D. [x]  Joyice FasterJonathan Binz, Pharm.D.  Positive urine culture Treated with Cefuroxime, organism sensitive to the same and no further patient follow-up is required at this time.  [place], Jenel LucksKylie 03/09/2014, 5:30 PM

## 2014-04-23 DIAGNOSIS — N390 Urinary tract infection, site not specified: Secondary | ICD-10-CM | POA: Diagnosis not present

## 2014-06-08 DIAGNOSIS — Z23 Encounter for immunization: Secondary | ICD-10-CM | POA: Diagnosis not present

## 2014-06-19 DIAGNOSIS — E119 Type 2 diabetes mellitus without complications: Secondary | ICD-10-CM | POA: Diagnosis not present

## 2014-06-19 DIAGNOSIS — F0390 Unspecified dementia without behavioral disturbance: Secondary | ICD-10-CM | POA: Diagnosis not present

## 2014-06-19 DIAGNOSIS — I10 Essential (primary) hypertension: Secondary | ICD-10-CM | POA: Diagnosis not present

## 2014-06-19 DIAGNOSIS — E039 Hypothyroidism, unspecified: Secondary | ICD-10-CM | POA: Diagnosis not present

## 2014-09-13 ENCOUNTER — Emergency Department (HOSPITAL_COMMUNITY)
Admission: EM | Admit: 2014-09-13 | Discharge: 2014-09-13 | Disposition: A | Discharge status: Home / Self Care | Payer: Medicare Other | Attending: Emergency Medicine | Admitting: Emergency Medicine

## 2014-09-13 ENCOUNTER — Encounter (HOSPITAL_COMMUNITY): Payer: Self-pay | Admitting: *Deleted

## 2014-09-13 ENCOUNTER — Emergency Department (HOSPITAL_COMMUNITY): Payer: Medicare Other

## 2014-09-13 DIAGNOSIS — I10 Essential (primary) hypertension: Secondary | ICD-10-CM | POA: Insufficient documentation

## 2014-09-13 DIAGNOSIS — Z7982 Long term (current) use of aspirin: Secondary | ICD-10-CM | POA: Diagnosis not present

## 2014-09-13 DIAGNOSIS — H109 Unspecified conjunctivitis: Secondary | ICD-10-CM | POA: Diagnosis not present

## 2014-09-13 DIAGNOSIS — R059 Cough, unspecified: Secondary | ICD-10-CM

## 2014-09-13 DIAGNOSIS — J069 Acute upper respiratory infection, unspecified: Secondary | ICD-10-CM

## 2014-09-13 DIAGNOSIS — Z792 Long term (current) use of antibiotics: Secondary | ICD-10-CM | POA: Insufficient documentation

## 2014-09-13 DIAGNOSIS — Z79899 Other long term (current) drug therapy: Secondary | ICD-10-CM | POA: Insufficient documentation

## 2014-09-13 DIAGNOSIS — N390 Urinary tract infection, site not specified: Secondary | ICD-10-CM | POA: Insufficient documentation

## 2014-09-13 DIAGNOSIS — Z862 Personal history of diseases of the blood and blood-forming organs and certain disorders involving the immune mechanism: Secondary | ICD-10-CM | POA: Insufficient documentation

## 2014-09-13 DIAGNOSIS — F0390 Unspecified dementia without behavioral disturbance: Secondary | ICD-10-CM | POA: Diagnosis not present

## 2014-09-13 DIAGNOSIS — E119 Type 2 diabetes mellitus without complications: Secondary | ICD-10-CM | POA: Diagnosis not present

## 2014-09-13 DIAGNOSIS — R404 Transient alteration of awareness: Secondary | ICD-10-CM | POA: Diagnosis not present

## 2014-09-13 DIAGNOSIS — R05 Cough: Secondary | ICD-10-CM | POA: Diagnosis not present

## 2014-09-13 DIAGNOSIS — Z8673 Personal history of transient ischemic attack (TIA), and cerebral infarction without residual deficits: Secondary | ICD-10-CM | POA: Insufficient documentation

## 2014-09-13 DIAGNOSIS — R531 Weakness: Secondary | ICD-10-CM | POA: Diagnosis not present

## 2014-09-13 DIAGNOSIS — R069 Unspecified abnormalities of breathing: Secondary | ICD-10-CM | POA: Diagnosis not present

## 2014-09-13 LAB — CBC WITH DIFFERENTIAL/PLATELET
BASOS PCT: 0 % (ref 0–1)
Basophils Absolute: 0 10*3/uL (ref 0.0–0.1)
EOS ABS: 0.1 10*3/uL (ref 0.0–0.7)
Eosinophils Relative: 1 % (ref 0–5)
HCT: 35 % — ABNORMAL LOW (ref 36.0–46.0)
HEMOGLOBIN: 12 g/dL (ref 12.0–15.0)
Lymphocytes Relative: 14 % (ref 12–46)
Lymphs Abs: 0.9 10*3/uL (ref 0.7–4.0)
MCH: 33.2 pg (ref 26.0–34.0)
MCHC: 34.3 g/dL (ref 30.0–36.0)
MCV: 97 fL (ref 78.0–100.0)
MONO ABS: 0.4 10*3/uL (ref 0.1–1.0)
Monocytes Relative: 7 % (ref 3–12)
NEUTROS ABS: 4.9 10*3/uL (ref 1.7–7.7)
Neutrophils Relative %: 78 % — ABNORMAL HIGH (ref 43–77)
Platelets: 103 10*3/uL — ABNORMAL LOW (ref 150–400)
RBC: 3.61 MIL/uL — ABNORMAL LOW (ref 3.87–5.11)
RDW: 12.9 % (ref 11.5–15.5)
WBC: 6.3 10*3/uL (ref 4.0–10.5)

## 2014-09-13 LAB — URINALYSIS, ROUTINE W REFLEX MICROSCOPIC
Glucose, UA: NEGATIVE mg/dL
KETONES UR: 15 mg/dL — AB
NITRITE: POSITIVE — AB
PROTEIN: 100 mg/dL — AB
Specific Gravity, Urine: >1.03 — ABNORMAL HIGH (ref 1.005–1.030)
Urobilinogen, UA: 1 mg/dL (ref 0.0–1.0)
pH: 5 (ref 5.0–8.0)

## 2014-09-13 LAB — URINE MICROSCOPIC-ADD ON

## 2014-09-13 LAB — BASIC METABOLIC PANEL
Anion gap: 5 (ref 5–15)
BUN: 17 mg/dL (ref 6–23)
CHLORIDE: 106 meq/L (ref 96–112)
CO2: 25 mmol/L (ref 19–32)
Calcium: 9.3 mg/dL (ref 8.4–10.5)
Creatinine, Ser: 0.94 mg/dL (ref 0.50–1.10)
GFR calc non Af Amer: 56 mL/min — ABNORMAL LOW (ref >90)
GFR, EST AFRICAN AMERICAN: 65 mL/min — AB (ref >90)
Glucose, Bld: 214 mg/dL — ABNORMAL HIGH (ref 70–99)
Potassium: 4 mmol/L (ref 3.5–5.1)
Sodium: 136 mmol/L (ref 135–145)

## 2014-09-13 MED ORDER — CEPHALEXIN 500 MG PO CAPS: 500.0000 mg | ORAL_CAPSULE | Freq: Four times a day (QID) | ORAL | Status: DC

## 2014-09-13 MED ORDER — SODIUM CHLORIDE 0.9 % IV SOLN
INTRAVENOUS | Status: DC
  Administered 2014-09-13: 16:00:00 via INTRAVENOUS

## 2014-09-13 MED ORDER — ERYTHROMYCIN 5 MG/GM OP OINT: TOPICAL_OINTMENT | OPHTHALMIC | Status: DC

## 2014-09-13 MED ORDER — DEXTROSE 5 % IV SOLN
1.0000 g | Freq: Once | INTRAVENOUS | Status: AC
  Administered 2014-09-13: 1 g via INTRAVENOUS
  Filled 2014-09-13: qty 10

## 2014-09-13 NOTE — ED Notes (Signed)
Pt is a resident of [facility].  EMS States irritation to both eyes and productive cough, green in color.

## 2014-09-13 NOTE — ED Notes (Addendum)
Pt refuses vitals  

## 2014-09-13 NOTE — ED Provider Notes (Signed)
CSN: [id number]     Arrival date & time 09/13/14  1313 History   First MD Initiated Contact with Patient 09/13/14 1401     This chart was scribed for Vanetta MuldersScott Ellery Meroney, MD by Arlan OrganAshley Leger, ED Scribe. This patient was seen in room APA18/APA18 and the patient's care was started 4:55 PM.   Chief Complaint  Patient presents with  . Eye Problem  . Cough   Patient is a 80 y.o. female presenting with eye problem and cough. The history is provided by the patient. No language interpreter was used.  Eye Problem Location:  Both Severity:  Moderate Duration:  1 day Timing:  Constant Progression:  Unchanged Chronicity:  New Relieved by:  Nothing Worsened by:  Nothing tried Ineffective treatments:  None tried Cough  LEVEL 5 CAVEAT DUE TO DEMENTIA  HPI Comments: Jennifer Kerr here with her son in law is a 80 y.o. female with a PMHx of dementia, HTN, DM, and cerebrovascular disease who presents to the Emergency Department complaining of bilateral eye redness/irriation x 1 day. Son in law also reports a productive cough consisting of green mucous and dark colored urine. Pt denies any current pain. She is a current resident of [facility].   Past Medical History  Diagnosis Date  . Depression   . Dementia   . HTN (hypertension) 07/02/2011  . DM type 2 (diabetes mellitus, type 2) 07/02/2011  . UTI (lower urinary tract infection)   . Thrombocytopenia   . Elevated LFTs   . Cerebrovascular disease    Past Surgical History  Procedure Laterality Date  . Abdominal hysterectomy    . Cholecystectomy     Family History  Problem Relation Age of Onset  . Cancer Mother   . Heart disease Father    History  Substance Use Topics  . Smoking status: Never Smoker   . Smokeless tobacco: Never Used  . Alcohol Use: No   OB History    No data available     Review of Systems  Unable to perform ROS: Dementia  Respiratory: Positive for cough.       Allergies  Review of patient's  allergies indicates no known allergies.  Home Medications   Prior to Admission medications   Medication Sig Start Date End Date Taking? Authorizing Provider  acetaminophen (TYLENOL) 500 MG tablet Take 1,000 mg by mouth every 8 (eight) hours as needed.   Yes Historical Provider, MD  aspirin EC 81 MG tablet Take 81 mg by mouth daily.   Yes Historical Provider, MD  cetaphil (CETAPHIL) lotion Apply 1 application topically 2 (two) times a week. On Wednesday and Saturday   Yes Historical Provider, MD  cetirizine (ZYRTEC) 10 MG tablet Take 10 mg by mouth at bedtime.   Yes Historical Provider, MD  donepezil (ARICEPT) 5 MG tablet Take 5 mg by mouth at bedtime.   Yes Historical Provider, MD  levothyroxine (SYNTHROID, LEVOTHROID) 25 MCG tablet Take 25 mcg by mouth daily before breakfast.   Yes Historical Provider, MD  lisinopril (PRINIVIL,ZESTRIL) 20 MG tablet Take 20 mg by mouth daily.   Yes Historical Provider, MD  megestrol (MEGACE) 40 MG tablet Take 40 mg by mouth daily.   Yes Historical Provider, MD  Multiple Vitamin (DAILY VITE PO) Take 1 tablet by mouth 2 (two) times daily.    Yes Historical Provider, MD  vitamin C (ASCORBIC ACID) 500 MG tablet Take 1,000 mg by mouth daily.   Yes Historical Provider, MD  zinc sulfate  220 MG capsule Take 220 mg by mouth daily.   Yes Historical Provider, MD  cefUROXime (CEFTIN) 250 MG tablet Take 1 tablet (250 mg total) by mouth 2 (two) times daily with a meal. Patient not taking: Reported on 09/13/2014 03/04/14   Doug Sou, MD  cephALEXin (KEFLEX) 500 MG capsule Take 1 capsule (500 mg total) by mouth 4 (four) times daily. 09/13/14   Vanetta Mulders, MD  erythromycin ophthalmic ointment Place a 1/2 inch ribbon of ointment into the lower eyelid. 09/13/14   Vanetta Mulders, MD   Triage Vitals: BP 130/78 mmHg  Pulse 72  Temp(Src) 98.7 F (37.1 C) (Oral)  Resp 20  SpO2 100%   Physical Exam  Constitutional: She is oriented to person, place, and time. She  appears well-developed and well-nourished. No distress.  HENT:  Head: Normocephalic and atraumatic.  Mouth/Throat: Oropharynx is clear and moist.  Eyes: EOM are normal. Pupils are equal, round, and reactive to light.  Both conjuctiva are erythemetous with mild edema Mild purulent drainage noted to eyes Redness tracked down to lower lids and L check Eyes track appropriately  Neck: Normal range of motion.  Cardiovascular: Normal rate, regular rhythm and normal heart sounds.   No murmur heard. Pulmonary/Chest: Effort normal and breath sounds normal.  Abdominal: Soft. Bowel sounds are normal. There is no tenderness.  Musculoskeletal: Normal range of motion. She exhibits no edema.  Neurological: She is alert and oriented to person, place, and time. No cranial nerve deficit. She exhibits normal muscle tone. Coordination normal.  Skin: Skin is warm and dry.  Psychiatric: She has a normal mood and affect. Judgment normal.  Nursing note and vitals reviewed.   ED Course  Procedures (including critical care time)  DIAGNOSTIC STUDIES: Oxygen Saturation is 100% on RA, Normal by my interpretation.    COORDINATION OF CARE: 4:55 PM-Discussed treatment plan with pt at bedside and pt agreed to plan.     Labs Review Labs Reviewed  URINALYSIS, ROUTINE W REFLEX MICROSCOPIC - Abnormal; Notable for the following:    Color, Urine AMBER (*)    APPearance CLOUDY (*)    Specific Gravity, Urine >1.030 (*)    Hgb urine dipstick LARGE (*)    Bilirubin Urine SMALL (*)    Ketones, ur 15 (*)    Protein, ur 100 (*)    Nitrite POSITIVE (*)    Leukocytes, UA SMALL (*)    All other components within normal limits  URINE MICROSCOPIC-ADD ON - Abnormal; Notable for the following:    Squamous Epithelial / LPF FEW (*)    Bacteria, UA MANY (*)    All other components within normal limits  CBC WITH DIFFERENTIAL - Abnormal; Notable for the following:    RBC 3.61 (*)    HCT 35.0 (*)    Platelets 103 (*)     Neutrophils Relative % 78 (*)    All other components within normal limits  BASIC METABOLIC PANEL - Abnormal; Notable for the following:    Glucose, Bld 214 (*)    GFR calc non Af Amer 56 (*)    GFR calc Af Amer 65 (*)    All other components within normal limits  URINE CULTURE    Imaging Review Dg Chest Portable 1 View  09/13/2014   CLINICAL DATA:  Productive cough  EXAM: PORTABLE CHEST - 1 VIEW  COMPARISON:  October 29, 2013  FINDINGS: The patient's mandible obscures portions of each apex. Visualized lung regions are clear. Heart size and  pulmonary vascularity are normal. No adenopathy. No bone lesions.  IMPRESSION: Portions of each apex obscured by the mandible. No lung edema or consolidation in visualized regions.   Electronically Signed   By: Bretta BangWilliam  Woodruff M.D.   On: 09/13/2014 14:29     EKG Interpretation None      MDM   Final diagnoses:  Cough  URI (upper respiratory infection)  UTI (lower urinary tract infection)  Bilateral conjunctivitis    Patient with symptoms consistent with an upper respiratory infection bilateral conjunctivitis. Chest x-ray negative for pneumonia. Patient also clearly with urinary tract infection. Culture sent and pending. Will treat urinary tract infection with Keflex. Given first dose of Rocephin here. She did conjunctivitis with erythromycin eye ointment.  I personally performed the services described in this documentation, which was scribed in my presence. The recorded information has been reviewed and is accurate.    Vanetta MuldersScott Doyce Stonehouse, MD 09/13/14 1655

## 2014-09-13 NOTE — Discharge Instructions (Signed)
The conjunctivitis is most likely viral. But use the antibody I'm ointment in both eyes. Take Keflex for the urinary tract infection for the next week. Return for any new or worse symptoms. Chest x-ray negative for pneumonia.

## 2014-09-16 LAB — URINE CULTURE: Colony Count: >=100000

## 2014-09-17 ENCOUNTER — Telehealth (HOSPITAL_COMMUNITY): Payer: Self-pay

## 2014-09-17 NOTE — ED Notes (Signed)
Post ED Visit - Positive Culture Follow-up  Culture report reviewed by antimicrobial stewardship pharmacist: []  Wes Dulaney, Pharm.D., BCPS []  Celedonio MiyamotoJeremy Frens, Pharm.D., BCPS [x]  Georgina PillionElizabeth Martin, [address]Pharm.D., BCPS []  [place] MoundsMinh Pham, [address]Pharm.D., BCPS, AAHIVP []  Estella HuskMichelle Turner, Pharm.D., BCPS, AAHIVP []  Elder CyphersLorie Poole, [address]Pharm.D., BCPS  Positive urine culture Treated with cephalexin, organism sensitive to the same and no further patient follow-up is required at this time.  Ashley JacobsFesterman, Breckyn Ticas C 09/17/2014, 5:26 PM

## 2014-10-17 DIAGNOSIS — L84 Corns and callosities: Secondary | ICD-10-CM | POA: Diagnosis not present

## 2014-10-17 DIAGNOSIS — E1151 Type 2 diabetes mellitus with diabetic peripheral angiopathy without gangrene: Secondary | ICD-10-CM | POA: Diagnosis not present

## 2015-01-03 DIAGNOSIS — E1151 Type 2 diabetes mellitus with diabetic peripheral angiopathy without gangrene: Secondary | ICD-10-CM | POA: Diagnosis not present

## 2015-01-03 DIAGNOSIS — L84 Corns and callosities: Secondary | ICD-10-CM | POA: Diagnosis not present

## 2015-01-19 DIAGNOSIS — I10 Essential (primary) hypertension: Secondary | ICD-10-CM | POA: Diagnosis not present

## 2015-01-19 DIAGNOSIS — F0391 Unspecified dementia with behavioral disturbance: Secondary | ICD-10-CM | POA: Diagnosis not present

## 2015-01-19 DIAGNOSIS — E119 Type 2 diabetes mellitus without complications: Secondary | ICD-10-CM | POA: Diagnosis not present

## 2015-01-19 DIAGNOSIS — E039 Hypothyroidism, unspecified: Secondary | ICD-10-CM | POA: Diagnosis not present

## 2015-01-26 DIAGNOSIS — R05 Cough: Secondary | ICD-10-CM | POA: Diagnosis not present

## 2015-02-05 ENCOUNTER — Emergency Department (HOSPITAL_COMMUNITY)
Admission: EM | Admit: 2015-02-05 | Discharge: 2015-02-05 | Disposition: A | Discharge status: Home / Self Care | Payer: Medicare Other | Attending: Emergency Medicine | Admitting: Emergency Medicine

## 2015-02-05 ENCOUNTER — Encounter (HOSPITAL_COMMUNITY): Payer: Self-pay | Admitting: Emergency Medicine

## 2015-02-05 ENCOUNTER — Emergency Department (HOSPITAL_COMMUNITY): Payer: Medicare Other

## 2015-02-05 ENCOUNTER — Emergency Department (HOSPITAL_COMMUNITY)
Admission: EM | Admit: 2015-02-05 | Discharge: 2015-02-05 | Disposition: A | Discharge status: Home / Self Care | Payer: Medicaid Other

## 2015-02-05 DIAGNOSIS — Z862 Personal history of diseases of the blood and blood-forming organs and certain disorders involving the immune mechanism: Secondary | ICD-10-CM | POA: Insufficient documentation

## 2015-02-05 DIAGNOSIS — J209 Acute bronchitis, unspecified: Secondary | ICD-10-CM | POA: Insufficient documentation

## 2015-02-05 DIAGNOSIS — Z8673 Personal history of transient ischemic attack (TIA), and cerebral infarction without residual deficits: Secondary | ICD-10-CM | POA: Insufficient documentation

## 2015-02-05 DIAGNOSIS — J9809 Other diseases of bronchus, not elsewhere classified: Secondary | ICD-10-CM | POA: Diagnosis not present

## 2015-02-05 DIAGNOSIS — I10 Essential (primary) hypertension: Secondary | ICD-10-CM | POA: Diagnosis not present

## 2015-02-05 DIAGNOSIS — Z87440 Personal history of urinary (tract) infections: Secondary | ICD-10-CM | POA: Diagnosis not present

## 2015-02-05 DIAGNOSIS — Z79899 Other long term (current) drug therapy: Secondary | ICD-10-CM | POA: Diagnosis not present

## 2015-02-05 DIAGNOSIS — Z7982 Long term (current) use of aspirin: Secondary | ICD-10-CM | POA: Insufficient documentation

## 2015-02-05 DIAGNOSIS — E119 Type 2 diabetes mellitus without complications: Secondary | ICD-10-CM | POA: Diagnosis not present

## 2015-02-05 DIAGNOSIS — F0390 Unspecified dementia without behavioral disturbance: Secondary | ICD-10-CM | POA: Insufficient documentation

## 2015-02-05 DIAGNOSIS — J40 Bronchitis, not specified as acute or chronic: Secondary | ICD-10-CM

## 2015-02-05 HISTORY — DX: Urinary tract infection, site not specified: N39.0

## 2015-02-05 HISTORY — DX: Essential (primary) hypertension: I10

## 2015-02-05 LAB — URINALYSIS, ROUTINE W REFLEX MICROSCOPIC
Bilirubin Urine: NEGATIVE
Glucose, UA: NEGATIVE mg/dL
Hgb urine dipstick: NEGATIVE
KETONES UR: NEGATIVE mg/dL
LEUKOCYTES UA: NEGATIVE
NITRITE: NEGATIVE
PH: 6.5 (ref 5.0–8.0)
Protein, ur: NEGATIVE mg/dL
SPECIFIC GRAVITY, URINE: 1.025 (ref 1.005–1.030)
Urobilinogen, UA: 1 mg/dL (ref 0.0–1.0)

## 2015-02-05 LAB — TROPONIN I: Troponin I: <0.03 ng/mL (ref <0.031)

## 2015-02-05 LAB — CBC WITH DIFFERENTIAL/PLATELET
Basophils Absolute: 0 10*3/uL (ref 0.0–0.1)
Basophils Relative: 0 % (ref 0–1)
Eosinophils Absolute: 0.1 10*3/uL (ref 0.0–0.7)
Eosinophils Relative: 2 % (ref 0–5)
HEMATOCRIT: 37.4 % (ref 36.0–46.0)
Hemoglobin: 12.6 g/dL (ref 12.0–15.0)
LYMPHS PCT: 24 % (ref 12–46)
Lymphs Abs: 1.2 10*3/uL (ref 0.7–4.0)
MCH: 32.3 pg (ref 26.0–34.0)
MCHC: 33.7 g/dL (ref 30.0–36.0)
MCV: 95.9 fL (ref 78.0–100.0)
MONOS PCT: 6 % (ref 3–12)
Monocytes Absolute: 0.3 10*3/uL (ref 0.1–1.0)
Neutro Abs: 3.4 10*3/uL (ref 1.7–7.7)
Neutrophils Relative %: 68 % (ref 43–77)
Platelets: 130 10*3/uL — ABNORMAL LOW (ref 150–400)
RBC: 3.9 MIL/uL (ref 3.87–5.11)
RDW: 13 % (ref 11.5–15.5)
WBC: 5 10*3/uL (ref 4.0–10.5)

## 2015-02-05 LAB — COMPREHENSIVE METABOLIC PANEL
ALK PHOS: 100 U/L (ref 38–126)
ALT: 18 U/L (ref 14–54)
ANION GAP: 7 (ref 5–15)
AST: 28 U/L (ref 15–41)
Albumin: 3.4 g/dL — ABNORMAL LOW (ref 3.5–5.0)
BILIRUBIN TOTAL: 0.9 mg/dL (ref 0.3–1.2)
BUN: 14 mg/dL (ref 6–20)
CO2: 24 mmol/L (ref 22–32)
Calcium: 9 mg/dL (ref 8.9–10.3)
Chloride: 106 mmol/L (ref 101–111)
Creatinine, Ser: 0.72 mg/dL (ref 0.44–1.00)
GFR calc Af Amer: >60 mL/min (ref >60)
GFR calc non Af Amer: >60 mL/min (ref >60)
Glucose, Bld: 149 mg/dL — ABNORMAL HIGH (ref 65–99)
POTASSIUM: 4 mmol/L (ref 3.5–5.1)
Sodium: 137 mmol/L (ref 135–145)
TOTAL PROTEIN: 6.3 g/dL — AB (ref 6.5–8.1)

## 2015-02-05 LAB — D-DIMER, QUANTITATIVE (NOT AT ARMC): D DIMER QUANT: 3.21 ug{FEU}/mL — AB (ref 0.00–0.48)

## 2015-02-05 MED ORDER — IOHEXOL 350 MG/ML SOLN
100.0000 mL | Freq: Once | INTRAVENOUS | Status: AC | PRN
  Administered 2015-02-05: 100 mL via INTRAVENOUS

## 2015-02-05 MED ORDER — AZITHROMYCIN 250 MG PO TABS: ORAL_TABLET | ORAL | Status: DC

## 2015-02-05 NOTE — ED Provider Notes (Signed)
CSN: [id number]     Arrival date & time 02/05/15  1124 History   First MD Initiated Contact with Patient 02/05/15 1325     Chief Complaint  Patient presents with  . Hypertension     (Consider location/radiation/quality/duration/timing/severity/associated sxs/prior Treatment) HPI Comments: Level V caveat for dementia. Patient from [facility] with elevated blood pressure to 160 systolic and apparent altered mental status. Facility reported she was staring spell he said not following commands as she normally does. Patient's daughter at bedside feel she is at baseline. Patient is awake and alert. She is oriented 2. She has some baseline dementia. She is following commands and denies any complaint. She does not know why she is here. No fever. Family states patient was treated by PCP for hemoptysis 2 weeks ago which has improved. Good by mouth intake and urine output. No chest pain or shortness of breath. No focal weakness, numbness or tingling. No falls.  The history is provided by the patient and the EMS personnel. The history is limited by the condition of the patient.    Past Medical History  Diagnosis Date  . Depression   . Dementia   . HTN (hypertension) 07/02/2011  . DM type 2 (diabetes mellitus, type 2) 07/02/2011  . UTI (lower urinary tract infection)   . Thrombocytopenia   . Elevated LFTs   . Cerebrovascular disease    Past Surgical History  Procedure Laterality Date  . Abdominal hysterectomy    . Cholecystectomy     Family History  Problem Relation Age of Onset  . Cancer Mother   . Heart disease Father    History  Substance Use Topics  . Smoking status: Never Smoker   . Smokeless tobacco: Never Used  . Alcohol Use: No   OB History    No data available     Review of Systems  Unable to perform ROS: Dementia      Allergies  Review of patient's allergies indicates no known allergies.  Home Medications   Prior to Admission medications   Medication Sig  Start Date End Date Taking? Authorizing Provider  acetaminophen (TYLENOL) 500 MG tablet Take 1,000 mg by mouth every 8 (eight) hours as needed.   Yes Historical Provider, MD  aspirin EC 81 MG tablet Take 81 mg by mouth daily.   Yes Historical Provider, MD  cetaphil (CETAPHIL) lotion Apply 1 application topically 2 (two) times a week. On Wednesday and Saturday   Yes Historical Provider, MD  cetirizine (ZYRTEC) 10 MG tablet Take 10 mg by mouth at bedtime.   Yes Historical Provider, MD  donepezil (ARICEPT) 5 MG tablet Take 5 mg by mouth daily.    Yes Historical Provider, MD  levothyroxine (SYNTHROID, LEVOTHROID) 25 MCG tablet Take 25 mcg by mouth daily before breakfast.   Yes Historical Provider, MD  lisinopril (PRINIVIL,ZESTRIL) 20 MG tablet Take 20 mg by mouth daily.   Yes Historical Provider, MD  megestrol (MEGACE) 40 MG tablet Take 40 mg by mouth daily.   Yes Historical Provider, MD  Multiple Vitamin (DAILY VITE PO) Take 1 tablet by mouth 2 (two) times daily.    Yes Historical Provider, MD  vitamin C (ASCORBIC ACID) 500 MG tablet Take 1,000 mg by mouth daily.   Yes Historical Provider, MD  zinc sulfate 220 MG capsule Take 220 mg by mouth daily.   Yes Historical Provider, MD  cefUROXime (CEFTIN) 250 MG tablet Take 1 tablet (250 mg total) by mouth 2 (two) times daily with  a meal. Patient not taking: Reported on 09/13/2014 03/04/14   Doug SouSam Jacubowitz, MD  cephALEXin (KEFLEX) 500 MG capsule Take 1 capsule (500 mg total) by mouth 4 (four) times daily. Patient not taking: Reported on 02/05/2015 09/13/14   Vanetta MuldersScott Zackowski, MD  erythromycin ophthalmic ointment Place a 1/2 inch ribbon of ointment into the lower eyelid. Patient not taking: Reported on 02/05/2015 09/13/14   Vanetta MuldersScott Zackowski, MD   BP 148/55 mmHg  Pulse 61  Temp(Src) 97.9 F (36.6 C) (Oral)  Resp 21  SpO2 95% Physical Exam  Constitutional: She is oriented to person, place, and time. She appears well-developed and well-nourished. No distress.   HENT:  Head: Normocephalic and atraumatic.  Mouth/Throat: Oropharynx is clear and moist. No oropharyngeal exudate.  Eyes: Conjunctivae and EOM are normal. Pupils are equal, round, and reactive to light.  Neck: Normal range of motion. Neck supple.  No meningismus.  Cardiovascular: Normal rate, regular rhythm, normal heart sounds and intact distal pulses.   No murmur heard. Pulmonary/Chest: Effort normal and breath sounds normal. No respiratory distress. She exhibits no tenderness.  Abdominal: Soft. There is no tenderness. There is no rebound and no guarding.  Musculoskeletal: Normal range of motion. She exhibits no edema or tenderness.  Neurological: She is alert and oriented to person, place, and time. No cranial nerve deficit. She exhibits normal muscle tone. Coordination normal.  CN 2-12 intact, 5.5 strength throughout Confused. Oriented x2  Skin: Skin is warm.  Psychiatric: She has a normal mood and affect. Her behavior is normal.  Nursing note and vitals reviewed.   ED Course  Procedures (including critical care time) Labs Review Labs Reviewed  CBC WITH DIFFERENTIAL/PLATELET - Abnormal; Notable for the following:    Platelets 130 (*)    All other components within normal limits  COMPREHENSIVE METABOLIC PANEL - Abnormal; Notable for the following:    Glucose, Bld 149 (*)    Total Protein 6.3 (*)    Albumin 3.4 (*)    All other components within normal limits  D-DIMER, QUANTITATIVE - Abnormal; Notable for the following:    D-Dimer, Quant 3.21 (*)    All other components within normal limits  TROPONIN I  URINALYSIS, ROUTINE W REFLEX MICROSCOPIC    Imaging Review Dg Chest 2 View  02/05/2015   CLINICAL DATA:  Hypertension, dementia, type 2 diabetes  EXAM: CHEST  2 VIEW  COMPARISON:  09/13/2014  FINDINGS: Normal heart size, mediastinal contours, and pulmonary vascularity.  Lungs clear.  No pleural effusion or pneumothorax.  Bones demineralized.  IMPRESSION: No acute  abnormalities.   Electronically Signed   By: Ulyses SouthwardMark  Boles M.D.   On: 02/05/2015 14:58   Ct Head Wo Contrast  02/05/2015   CLINICAL DATA:  Hypertension, not following commands  EXAM: CT HEAD WITHOUT CONTRAST  TECHNIQUE: Contiguous axial images were obtained from the base of the skull through the vertex without intravenous contrast.  COMPARISON:  10/22/2012  FINDINGS: No skull fracture is noted. There is significant mucosal thickening bilateral maxillary sinus. Almost complete opacification of right maxillary sinus. Almost complete opacification of left sphenoid sinus. Partial opacification of the right maxillary sinus. The mastoid air cells are unremarkable.  No intracranial hemorrhage, mass effect or midline shift. Stable cerebral atrophy. Stable periventricular chronic white matter disease. No definite acute cortical infarction. No mass lesion is noted on this unenhanced scan.  IMPRESSION: No acute intracranial abnormality. Stable cerebral atrophy and chronic white matter disease. No definite acute cortical infarction. Paranasal sinuses disease as  described above.   Electronically Signed   By: Natasha MeadLiviu  Pop M.D.   On: 02/05/2015 15:11     EKG Interpretation   Date/Time:  Monday Feb 05 2015 14:06:38 EDT Ventricular Rate:  59 PR Interval:  165 QRS Duration: 122 QT Interval:  443 QTC Calculation: 439 R Axis:   -53 Text Interpretation:  Sinus rhythm Left bundle branch block Baseline  wander in lead(s) I No significant change was found Confirmed by Manus GunningANCOUR   MD, Joseguadalupe Stan [phone]([id number]) on 02/05/2015 2:41:03 PM      MDM   Final diagnoses:  Essential hypertension   Patient from nursing home with decreased activity level not following commands is normal. Daughter at bedside reports patient is following commands and seems normal at this time. She has a history dementia. She normally walks with a walker.  CT head is negative. Lab workup is reassuring. Chest x-ray negative. Urinalysis negative. Labs appear to  be at baseline.  Daughter reports patient had ongoing hemoptysis for the past 2 weeks that was treated as bronchitis by her PCP.  D-dimer is elevated we'll check CT PE to rule out pulmonary embolism.  The patient is able to ambulate at her baseline tolerate by mouth he should be able to return to her facility. Dr. Radford PaxBeaton to assume care at shift change.   Glynn OctaveStephen Jabarri Stefanelli, MD 02/05/15 [phone]

## 2015-02-05 NOTE — ED Notes (Signed)
Per EMS, [facility]. Per staff, pt has been staring off into space and not following commands like she usually does. They also state pt has been hypertensive. Pt has hx of HTN and dementia. Pt alert and able to follow commands for EMS. NAD noted. BP 124/64, HR 69, NSR 97% on RA.

## 2015-02-05 NOTE — ED Notes (Signed)
Gave pt water and graham crackers. Pt tolerating well at this time.

## 2015-02-05 NOTE — Discharge Instructions (Signed)
Hypertension Your work up is reassuring. Follow up with your doctor for a recheck this week. Return to the ED if you develop new or worsening symptoms. Hypertension, commonly called high blood pressure, is when the force of blood pumping through your arteries is too strong. Your arteries are the blood vessels that carry blood from your heart throughout your body. A blood pressure reading consists of a higher number over a lower number, such as 110/72. The higher number (systolic) is the pressure inside your arteries when your heart pumps. The lower number (diastolic) is the pressure inside your arteries when your heart relaxes. Ideally you want your blood pressure below 120/80. Hypertension forces your heart to work harder to pump blood. Your arteries may become narrow or stiff. Having hypertension puts you at risk for heart disease, stroke, and other problems.  RISK FACTORS Some risk factors for high blood pressure are controllable. Others are not.  Risk factors you cannot control include:   Race. You may be at higher risk if you are African American.  Age. Risk increases with age.  Gender. Men are at higher risk than women before age 79 years. After age 79, women are at higher risk than men. Risk factors you can control include:  Not getting enough exercise or physical activity.  Being overweight.  Getting too much fat, sugar, calories, or salt in your diet.  Drinking too much alcohol. SIGNS AND SYMPTOMS Hypertension does not usually cause signs or symptoms. Extremely high blood pressure (hypertensive crisis) may cause headache, anxiety, shortness of breath, and nosebleed. DIAGNOSIS  To check if you have hypertension, your health care provider will measure your blood pressure while you are seated, with your arm held at the level of your heart. It should be measured at least twice using the same arm. Certain conditions can cause a difference in blood pressure between your right and left  arms. A blood pressure reading that is higher than normal on one occasion does not mean that you need treatment. If one blood pressure reading is high, ask your health care provider about having it checked again. TREATMENT  Treating high blood pressure includes making lifestyle changes and possibly taking medicine. Living a healthy lifestyle can help lower high blood pressure. You may need to change some of your habits. Lifestyle changes may include:  Following the DASH diet. This diet is high in fruits, vegetables, and whole grains. It is low in salt, red meat, and added sugars.  Getting at least 2 hours of brisk physical activity every week.  Losing weight if necessary.  Not smoking.  Limiting alcoholic beverages.  Learning ways to reduce stress. If lifestyle changes are not enough to get your blood pressure under control, your health care provider may prescribe medicine. You may need to take more than one. Work closely with your health care provider to understand the risks and benefits. HOME CARE INSTRUCTIONS  Have your blood pressure rechecked as directed by your health care provider.   Take medicines only as directed by your health care provider. Follow the directions carefully. Blood pressure medicines must be taken as prescribed. The medicine does not work as well when you skip doses. Skipping doses also puts you at risk for problems.   Do not smoke.   Monitor your blood pressure at home as directed by your health care provider. SEEK MEDICAL CARE IF:   You think you are having a reaction to medicines taken.  You have recurrent headaches or feel dizzy.  You have swelling in your ankles.  You have trouble with your vision. SEEK IMMEDIATE MEDICAL CARE IF:  You develop a severe headache or confusion.  You have unusual weakness, numbness, or feel faint.  You have severe chest or abdominal pain.  You vomit repeatedly.  You have trouble breathing. MAKE SURE YOU:     Understand these instructions.  Will watch your condition.  Will get help right away if you are not doing well or get worse. Document Released: 09/08/2005 Document Revised: 01/23/2014 Document Reviewed: 07/01/2013 [facility] Patient Information 2015 [place], [place]. This information is not intended to replace advice given to you by your health care provider. Make sure you discuss any questions you have with your health care provider.

## 2015-02-05 NOTE — ED Notes (Signed)
MD Rancour at bedside 

## 2015-02-05 NOTE — ED Notes (Addendum)
[facility] EMS[facility]. Per staff, pt has been staring off into space and not following commands like she usually does. They also state pt has been hypertensive. Pt has hx of HTN. Pt alert and able to follow commands for EMS. NAD noted. BP 124/64, HR 69, NSR 97% on RA.

## 2015-02-05 NOTE — ED Notes (Signed)
Pt refuses to ambulate. Per family, pt only ambulates at nursing facility to and from bedroom to dining area with walker, but pt has been a lot weaker the past two weeks.

## 2015-02-05 NOTE — ED Notes (Signed)
Phlebotomy at bedside.

## 2015-02-15 DIAGNOSIS — E119 Type 2 diabetes mellitus without complications: Secondary | ICD-10-CM | POA: Diagnosis not present

## 2015-02-15 DIAGNOSIS — F0390 Unspecified dementia without behavioral disturbance: Secondary | ICD-10-CM | POA: Diagnosis not present

## 2015-02-15 DIAGNOSIS — I10 Essential (primary) hypertension: Secondary | ICD-10-CM | POA: Diagnosis not present

## 2015-02-15 DIAGNOSIS — N393 Stress incontinence (female) (male): Secondary | ICD-10-CM | POA: Diagnosis not present

## 2015-02-15 DIAGNOSIS — R262 Difficulty in walking, not elsewhere classified: Secondary | ICD-10-CM | POA: Diagnosis not present

## 2015-02-15 DIAGNOSIS — M6281 Muscle weakness (generalized): Secondary | ICD-10-CM | POA: Diagnosis not present

## 2015-02-15 DIAGNOSIS — Z87440 Personal history of urinary (tract) infections: Secondary | ICD-10-CM | POA: Diagnosis not present

## 2015-02-15 DIAGNOSIS — Z9181 History of falling: Secondary | ICD-10-CM | POA: Diagnosis not present

## 2015-02-20 DIAGNOSIS — R262 Difficulty in walking, not elsewhere classified: Secondary | ICD-10-CM | POA: Diagnosis not present

## 2015-02-20 DIAGNOSIS — E119 Type 2 diabetes mellitus without complications: Secondary | ICD-10-CM | POA: Diagnosis not present

## 2015-02-20 DIAGNOSIS — F0390 Unspecified dementia without behavioral disturbance: Secondary | ICD-10-CM | POA: Diagnosis not present

## 2015-02-20 DIAGNOSIS — M6281 Muscle weakness (generalized): Secondary | ICD-10-CM | POA: Diagnosis not present

## 2015-02-20 DIAGNOSIS — I10 Essential (primary) hypertension: Secondary | ICD-10-CM | POA: Diagnosis not present

## 2015-02-20 DIAGNOSIS — N393 Stress incontinence (female) (male): Secondary | ICD-10-CM | POA: Diagnosis not present

## 2015-02-22 DIAGNOSIS — R262 Difficulty in walking, not elsewhere classified: Secondary | ICD-10-CM | POA: Diagnosis not present

## 2015-02-22 DIAGNOSIS — E119 Type 2 diabetes mellitus without complications: Secondary | ICD-10-CM | POA: Diagnosis not present

## 2015-02-22 DIAGNOSIS — F0390 Unspecified dementia without behavioral disturbance: Secondary | ICD-10-CM | POA: Diagnosis not present

## 2015-02-22 DIAGNOSIS — M6281 Muscle weakness (generalized): Secondary | ICD-10-CM | POA: Diagnosis not present

## 2015-02-22 DIAGNOSIS — N393 Stress incontinence (female) (male): Secondary | ICD-10-CM | POA: Diagnosis not present

## 2015-02-22 DIAGNOSIS — I10 Essential (primary) hypertension: Secondary | ICD-10-CM | POA: Diagnosis not present

## 2015-02-26 DIAGNOSIS — N393 Stress incontinence (female) (male): Secondary | ICD-10-CM | POA: Diagnosis not present

## 2015-02-26 DIAGNOSIS — R262 Difficulty in walking, not elsewhere classified: Secondary | ICD-10-CM | POA: Diagnosis not present

## 2015-02-26 DIAGNOSIS — M6281 Muscle weakness (generalized): Secondary | ICD-10-CM | POA: Diagnosis not present

## 2015-02-26 DIAGNOSIS — I10 Essential (primary) hypertension: Secondary | ICD-10-CM | POA: Diagnosis not present

## 2015-02-26 DIAGNOSIS — F0390 Unspecified dementia without behavioral disturbance: Secondary | ICD-10-CM | POA: Diagnosis not present

## 2015-02-26 DIAGNOSIS — E119 Type 2 diabetes mellitus without complications: Secondary | ICD-10-CM | POA: Diagnosis not present

## 2015-02-28 DIAGNOSIS — R262 Difficulty in walking, not elsewhere classified: Secondary | ICD-10-CM | POA: Diagnosis not present

## 2015-02-28 DIAGNOSIS — F0390 Unspecified dementia without behavioral disturbance: Secondary | ICD-10-CM | POA: Diagnosis not present

## 2015-02-28 DIAGNOSIS — E119 Type 2 diabetes mellitus without complications: Secondary | ICD-10-CM | POA: Diagnosis not present

## 2015-02-28 DIAGNOSIS — I10 Essential (primary) hypertension: Secondary | ICD-10-CM | POA: Diagnosis not present

## 2015-02-28 DIAGNOSIS — N393 Stress incontinence (female) (male): Secondary | ICD-10-CM | POA: Diagnosis not present

## 2015-02-28 DIAGNOSIS — M6281 Muscle weakness (generalized): Secondary | ICD-10-CM | POA: Diagnosis not present

## 2015-03-08 DIAGNOSIS — N393 Stress incontinence (female) (male): Secondary | ICD-10-CM | POA: Diagnosis not present

## 2015-03-08 DIAGNOSIS — M6281 Muscle weakness (generalized): Secondary | ICD-10-CM | POA: Diagnosis not present

## 2015-03-08 DIAGNOSIS — R262 Difficulty in walking, not elsewhere classified: Secondary | ICD-10-CM | POA: Diagnosis not present

## 2015-03-08 DIAGNOSIS — F0390 Unspecified dementia without behavioral disturbance: Secondary | ICD-10-CM | POA: Diagnosis not present

## 2015-03-08 DIAGNOSIS — I10 Essential (primary) hypertension: Secondary | ICD-10-CM | POA: Diagnosis not present

## 2015-03-08 DIAGNOSIS — E119 Type 2 diabetes mellitus without complications: Secondary | ICD-10-CM | POA: Diagnosis not present

## 2015-03-09 DIAGNOSIS — E119 Type 2 diabetes mellitus without complications: Secondary | ICD-10-CM | POA: Diagnosis not present

## 2015-03-09 DIAGNOSIS — I10 Essential (primary) hypertension: Secondary | ICD-10-CM | POA: Diagnosis not present

## 2015-03-09 DIAGNOSIS — R262 Difficulty in walking, not elsewhere classified: Secondary | ICD-10-CM | POA: Diagnosis not present

## 2015-03-09 DIAGNOSIS — N393 Stress incontinence (female) (male): Secondary | ICD-10-CM | POA: Diagnosis not present

## 2015-03-09 DIAGNOSIS — F0390 Unspecified dementia without behavioral disturbance: Secondary | ICD-10-CM | POA: Diagnosis not present

## 2015-03-09 DIAGNOSIS — M6281 Muscle weakness (generalized): Secondary | ICD-10-CM | POA: Diagnosis not present

## 2015-03-12 DIAGNOSIS — R262 Difficulty in walking, not elsewhere classified: Secondary | ICD-10-CM | POA: Diagnosis not present

## 2015-03-12 DIAGNOSIS — I10 Essential (primary) hypertension: Secondary | ICD-10-CM | POA: Diagnosis not present

## 2015-03-12 DIAGNOSIS — F0390 Unspecified dementia without behavioral disturbance: Secondary | ICD-10-CM | POA: Diagnosis not present

## 2015-03-12 DIAGNOSIS — N393 Stress incontinence (female) (male): Secondary | ICD-10-CM | POA: Diagnosis not present

## 2015-03-12 DIAGNOSIS — M6281 Muscle weakness (generalized): Secondary | ICD-10-CM | POA: Diagnosis not present

## 2015-03-12 DIAGNOSIS — E119 Type 2 diabetes mellitus without complications: Secondary | ICD-10-CM | POA: Diagnosis not present

## 2015-03-14 DIAGNOSIS — I10 Essential (primary) hypertension: Secondary | ICD-10-CM | POA: Diagnosis not present

## 2015-03-14 DIAGNOSIS — E119 Type 2 diabetes mellitus without complications: Secondary | ICD-10-CM | POA: Diagnosis not present

## 2015-03-14 DIAGNOSIS — F0390 Unspecified dementia without behavioral disturbance: Secondary | ICD-10-CM | POA: Diagnosis not present

## 2015-03-14 DIAGNOSIS — N393 Stress incontinence (female) (male): Secondary | ICD-10-CM | POA: Diagnosis not present

## 2015-03-14 DIAGNOSIS — M6281 Muscle weakness (generalized): Secondary | ICD-10-CM | POA: Diagnosis not present

## 2015-03-14 DIAGNOSIS — R262 Difficulty in walking, not elsewhere classified: Secondary | ICD-10-CM | POA: Diagnosis not present

## 2015-03-15 DIAGNOSIS — N393 Stress incontinence (female) (male): Secondary | ICD-10-CM | POA: Diagnosis not present

## 2015-03-15 DIAGNOSIS — M6281 Muscle weakness (generalized): Secondary | ICD-10-CM | POA: Diagnosis not present

## 2015-03-15 DIAGNOSIS — R262 Difficulty in walking, not elsewhere classified: Secondary | ICD-10-CM | POA: Diagnosis not present

## 2015-03-15 DIAGNOSIS — I10 Essential (primary) hypertension: Secondary | ICD-10-CM | POA: Diagnosis not present

## 2015-03-15 DIAGNOSIS — F0390 Unspecified dementia without behavioral disturbance: Secondary | ICD-10-CM | POA: Diagnosis not present

## 2015-03-15 DIAGNOSIS — E119 Type 2 diabetes mellitus without complications: Secondary | ICD-10-CM | POA: Diagnosis not present

## 2015-03-21 DIAGNOSIS — M6281 Muscle weakness (generalized): Secondary | ICD-10-CM | POA: Diagnosis not present

## 2015-03-21 DIAGNOSIS — F0390 Unspecified dementia without behavioral disturbance: Secondary | ICD-10-CM | POA: Diagnosis not present

## 2015-03-21 DIAGNOSIS — E1151 Type 2 diabetes mellitus with diabetic peripheral angiopathy without gangrene: Secondary | ICD-10-CM | POA: Diagnosis not present

## 2015-03-21 DIAGNOSIS — E119 Type 2 diabetes mellitus without complications: Secondary | ICD-10-CM | POA: Diagnosis not present

## 2015-03-21 DIAGNOSIS — N393 Stress incontinence (female) (male): Secondary | ICD-10-CM | POA: Diagnosis not present

## 2015-03-21 DIAGNOSIS — L84 Corns and callosities: Secondary | ICD-10-CM | POA: Diagnosis not present

## 2015-03-21 DIAGNOSIS — I10 Essential (primary) hypertension: Secondary | ICD-10-CM | POA: Diagnosis not present

## 2015-03-21 DIAGNOSIS — R262 Difficulty in walking, not elsewhere classified: Secondary | ICD-10-CM | POA: Diagnosis not present

## 2015-03-23 DIAGNOSIS — N393 Stress incontinence (female) (male): Secondary | ICD-10-CM | POA: Diagnosis not present

## 2015-03-23 DIAGNOSIS — E119 Type 2 diabetes mellitus without complications: Secondary | ICD-10-CM | POA: Diagnosis not present

## 2015-03-23 DIAGNOSIS — R262 Difficulty in walking, not elsewhere classified: Secondary | ICD-10-CM | POA: Diagnosis not present

## 2015-03-23 DIAGNOSIS — M6281 Muscle weakness (generalized): Secondary | ICD-10-CM | POA: Diagnosis not present

## 2015-03-23 DIAGNOSIS — I10 Essential (primary) hypertension: Secondary | ICD-10-CM | POA: Diagnosis not present

## 2015-03-23 DIAGNOSIS — F0390 Unspecified dementia without behavioral disturbance: Secondary | ICD-10-CM | POA: Diagnosis not present

## 2015-03-28 DIAGNOSIS — I10 Essential (primary) hypertension: Secondary | ICD-10-CM | POA: Diagnosis not present

## 2015-03-28 DIAGNOSIS — N393 Stress incontinence (female) (male): Secondary | ICD-10-CM | POA: Diagnosis not present

## 2015-03-28 DIAGNOSIS — E119 Type 2 diabetes mellitus without complications: Secondary | ICD-10-CM | POA: Diagnosis not present

## 2015-03-28 DIAGNOSIS — M6281 Muscle weakness (generalized): Secondary | ICD-10-CM | POA: Diagnosis not present

## 2015-03-28 DIAGNOSIS — R262 Difficulty in walking, not elsewhere classified: Secondary | ICD-10-CM | POA: Diagnosis not present

## 2015-03-28 DIAGNOSIS — F0390 Unspecified dementia without behavioral disturbance: Secondary | ICD-10-CM | POA: Diagnosis not present

## 2015-03-29 DIAGNOSIS — N393 Stress incontinence (female) (male): Secondary | ICD-10-CM | POA: Diagnosis not present

## 2015-03-29 DIAGNOSIS — F0390 Unspecified dementia without behavioral disturbance: Secondary | ICD-10-CM | POA: Diagnosis not present

## 2015-03-29 DIAGNOSIS — E119 Type 2 diabetes mellitus without complications: Secondary | ICD-10-CM | POA: Diagnosis not present

## 2015-03-29 DIAGNOSIS — M6281 Muscle weakness (generalized): Secondary | ICD-10-CM | POA: Diagnosis not present

## 2015-03-29 DIAGNOSIS — I10 Essential (primary) hypertension: Secondary | ICD-10-CM | POA: Diagnosis not present

## 2015-03-29 DIAGNOSIS — R262 Difficulty in walking, not elsewhere classified: Secondary | ICD-10-CM | POA: Diagnosis not present

## 2015-04-03 DIAGNOSIS — F0390 Unspecified dementia without behavioral disturbance: Secondary | ICD-10-CM | POA: Diagnosis not present

## 2015-04-03 DIAGNOSIS — N393 Stress incontinence (female) (male): Secondary | ICD-10-CM | POA: Diagnosis not present

## 2015-04-03 DIAGNOSIS — R262 Difficulty in walking, not elsewhere classified: Secondary | ICD-10-CM | POA: Diagnosis not present

## 2015-04-03 DIAGNOSIS — M6281 Muscle weakness (generalized): Secondary | ICD-10-CM | POA: Diagnosis not present

## 2015-04-03 DIAGNOSIS — E119 Type 2 diabetes mellitus without complications: Secondary | ICD-10-CM | POA: Diagnosis not present

## 2015-04-03 DIAGNOSIS — I10 Essential (primary) hypertension: Secondary | ICD-10-CM | POA: Diagnosis not present

## 2015-04-05 DIAGNOSIS — I10 Essential (primary) hypertension: Secondary | ICD-10-CM | POA: Diagnosis not present

## 2015-04-05 DIAGNOSIS — M6281 Muscle weakness (generalized): Secondary | ICD-10-CM | POA: Diagnosis not present

## 2015-04-05 DIAGNOSIS — E119 Type 2 diabetes mellitus without complications: Secondary | ICD-10-CM | POA: Diagnosis not present

## 2015-04-05 DIAGNOSIS — R262 Difficulty in walking, not elsewhere classified: Secondary | ICD-10-CM | POA: Diagnosis not present

## 2015-04-05 DIAGNOSIS — N393 Stress incontinence (female) (male): Secondary | ICD-10-CM | POA: Diagnosis not present

## 2015-04-05 DIAGNOSIS — F0390 Unspecified dementia without behavioral disturbance: Secondary | ICD-10-CM | POA: Diagnosis not present

## 2015-04-10 DIAGNOSIS — E119 Type 2 diabetes mellitus without complications: Secondary | ICD-10-CM | POA: Diagnosis not present

## 2015-04-10 DIAGNOSIS — F0390 Unspecified dementia without behavioral disturbance: Secondary | ICD-10-CM | POA: Diagnosis not present

## 2015-04-10 DIAGNOSIS — I10 Essential (primary) hypertension: Secondary | ICD-10-CM | POA: Diagnosis not present

## 2015-04-10 DIAGNOSIS — N393 Stress incontinence (female) (male): Secondary | ICD-10-CM | POA: Diagnosis not present

## 2015-04-10 DIAGNOSIS — R262 Difficulty in walking, not elsewhere classified: Secondary | ICD-10-CM | POA: Diagnosis not present

## 2015-04-10 DIAGNOSIS — M6281 Muscle weakness (generalized): Secondary | ICD-10-CM | POA: Diagnosis not present

## 2015-04-12 DIAGNOSIS — E119 Type 2 diabetes mellitus without complications: Secondary | ICD-10-CM | POA: Diagnosis not present

## 2015-04-12 DIAGNOSIS — R262 Difficulty in walking, not elsewhere classified: Secondary | ICD-10-CM | POA: Diagnosis not present

## 2015-04-12 DIAGNOSIS — F0390 Unspecified dementia without behavioral disturbance: Secondary | ICD-10-CM | POA: Diagnosis not present

## 2015-04-12 DIAGNOSIS — M6281 Muscle weakness (generalized): Secondary | ICD-10-CM | POA: Diagnosis not present

## 2015-04-12 DIAGNOSIS — I10 Essential (primary) hypertension: Secondary | ICD-10-CM | POA: Diagnosis not present

## 2015-04-12 DIAGNOSIS — N393 Stress incontinence (female) (male): Secondary | ICD-10-CM | POA: Diagnosis not present

## 2015-04-13 DIAGNOSIS — R262 Difficulty in walking, not elsewhere classified: Secondary | ICD-10-CM | POA: Diagnosis not present

## 2015-04-13 DIAGNOSIS — F0390 Unspecified dementia without behavioral disturbance: Secondary | ICD-10-CM | POA: Diagnosis not present

## 2015-04-13 DIAGNOSIS — M6281 Muscle weakness (generalized): Secondary | ICD-10-CM | POA: Diagnosis not present

## 2015-04-13 DIAGNOSIS — E119 Type 2 diabetes mellitus without complications: Secondary | ICD-10-CM | POA: Diagnosis not present

## 2015-04-13 DIAGNOSIS — I10 Essential (primary) hypertension: Secondary | ICD-10-CM | POA: Diagnosis not present

## 2015-04-13 DIAGNOSIS — N393 Stress incontinence (female) (male): Secondary | ICD-10-CM | POA: Diagnosis not present

## 2015-06-13 DIAGNOSIS — L84 Corns and callosities: Secondary | ICD-10-CM | POA: Diagnosis not present

## 2015-06-13 DIAGNOSIS — E1151 Type 2 diabetes mellitus with diabetic peripheral angiopathy without gangrene: Secondary | ICD-10-CM | POA: Diagnosis not present

## 2015-06-17 IMAGING — CR DG CHEST 2V
2 series · 2 of 2 positions shown · non-contrast
Comparison: 07/02/2011

CLINICAL DATA: Coughing and congestion.

EXAM:
CHEST  2 VIEW

[view not recorded (1 of 2)]
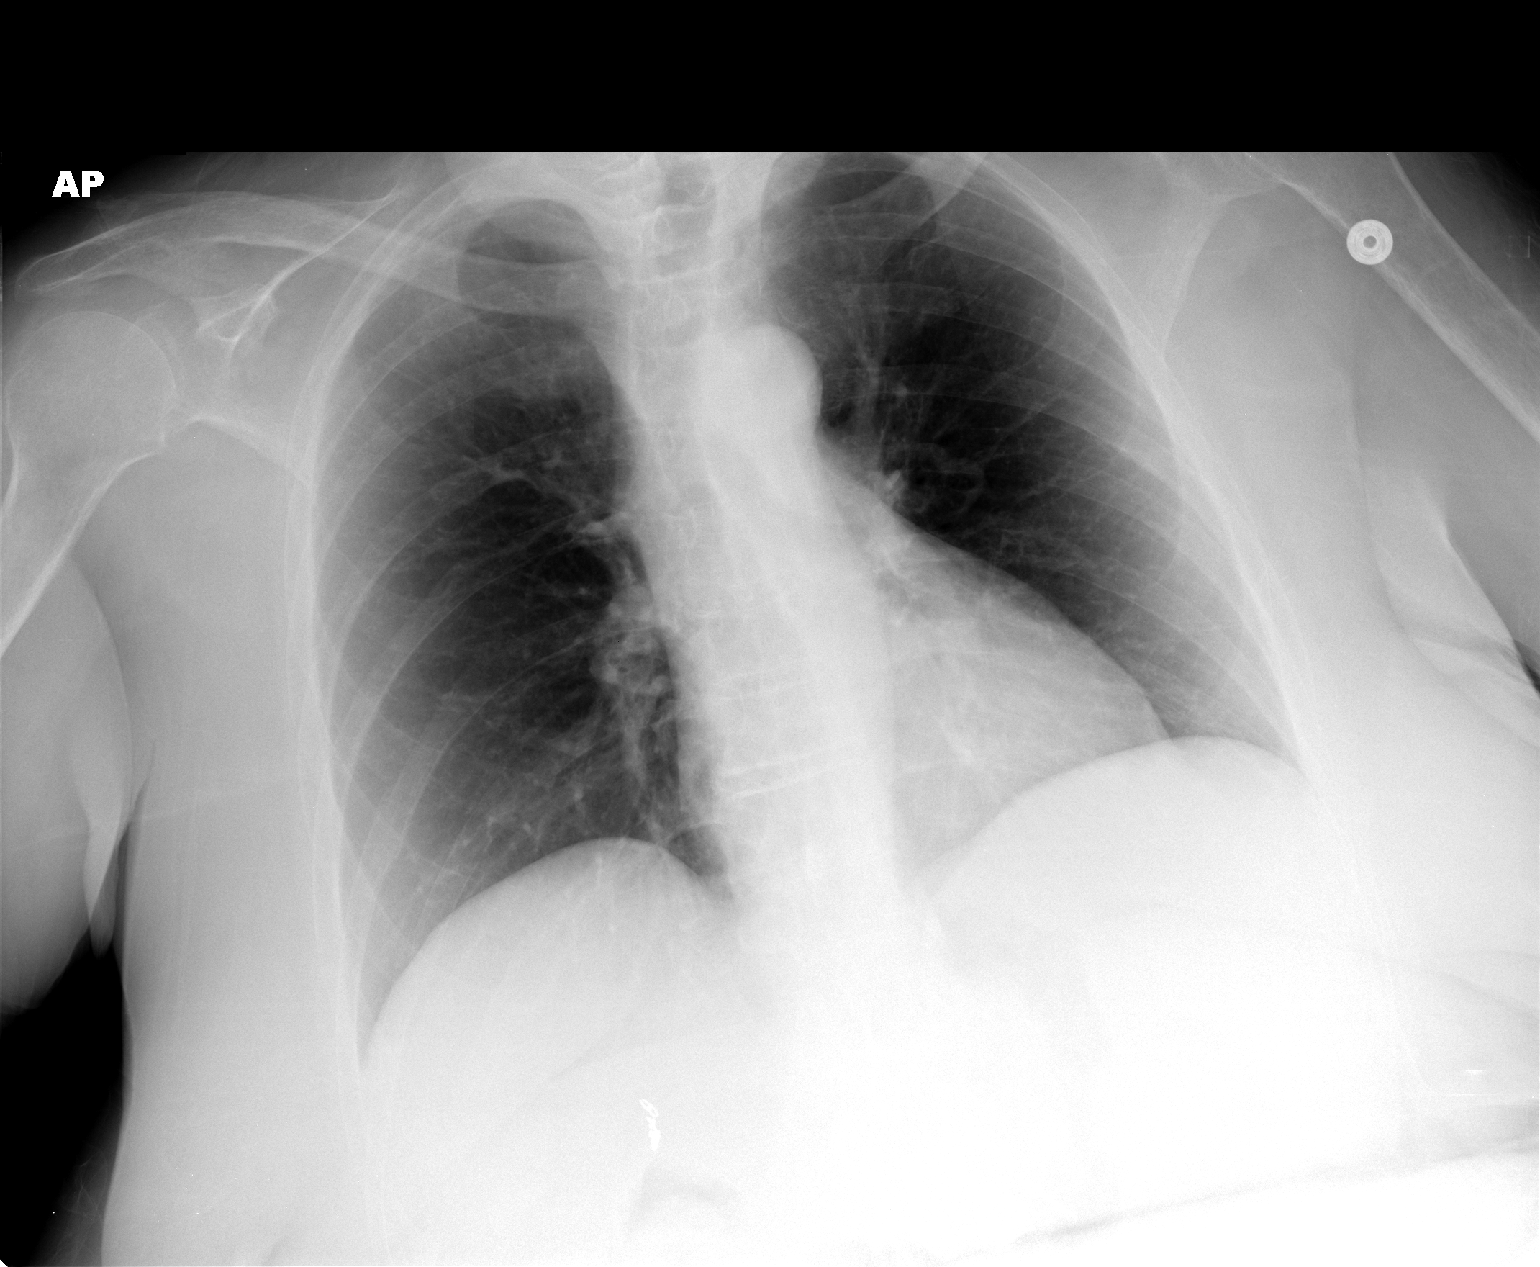

[view not recorded (2 of 2)]
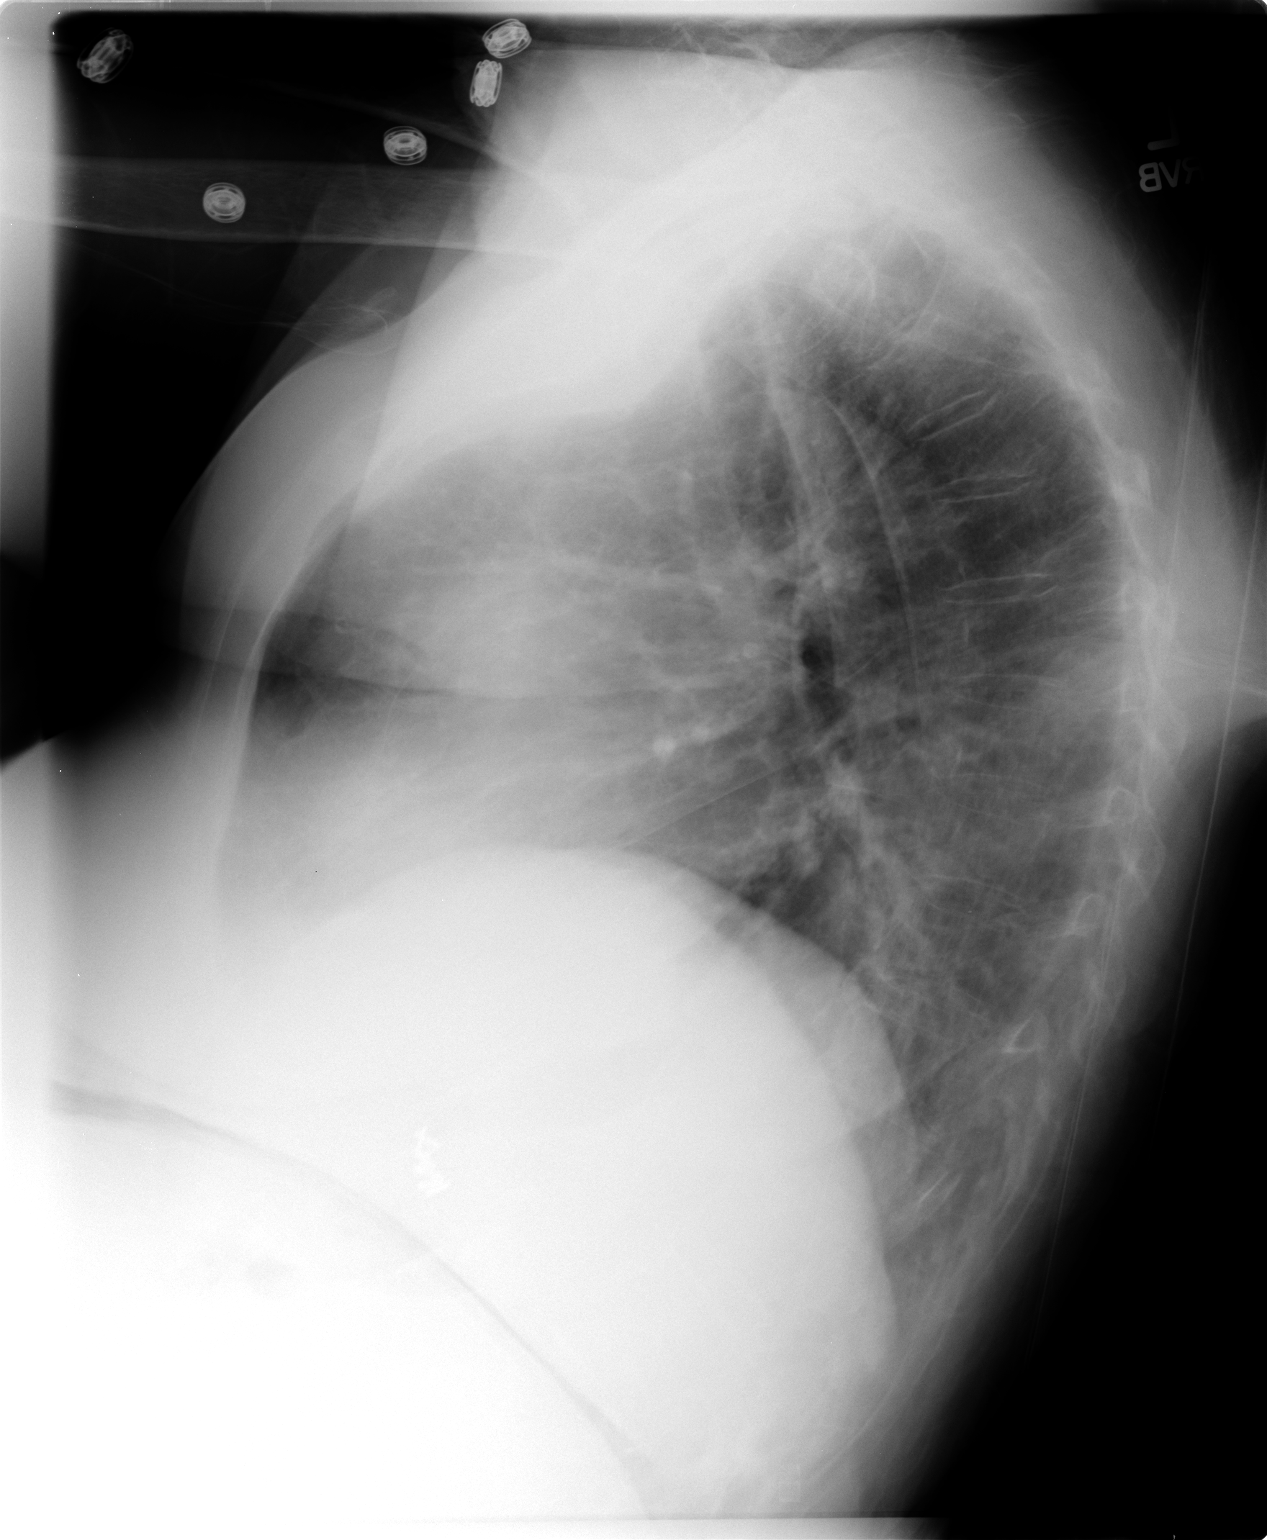

[2 of 2 positions shown; findings below may reference images not displayed]

FINDINGS: Lungs are clear bilaterally. Heart and mediastinum are within normal
limits. No evidence for pleural effusions. There may be a mild
compression deformity in the upper thoracic spine of unknown age.
IMPRESSION: No acute cardiopulmonary disease.

Question a mild compression deformity in the upper thoracic spine of
unknown age.

## 2015-06-19 DIAGNOSIS — Z23 Encounter for immunization: Secondary | ICD-10-CM | POA: Diagnosis not present

## 2015-07-24 DIAGNOSIS — E119 Type 2 diabetes mellitus without complications: Secondary | ICD-10-CM | POA: Diagnosis not present

## 2015-07-24 DIAGNOSIS — E039 Hypothyroidism, unspecified: Secondary | ICD-10-CM | POA: Diagnosis not present

## 2015-07-24 DIAGNOSIS — I10 Essential (primary) hypertension: Secondary | ICD-10-CM | POA: Diagnosis not present

## 2015-07-24 DIAGNOSIS — F0391 Unspecified dementia with behavioral disturbance: Secondary | ICD-10-CM | POA: Diagnosis not present

## 2015-07-24 DIAGNOSIS — F0390 Unspecified dementia without behavioral disturbance: Secondary | ICD-10-CM | POA: Diagnosis not present

## 2015-08-29 DIAGNOSIS — M79674 Pain in right toe(s): Secondary | ICD-10-CM | POA: Diagnosis not present

## 2015-08-29 DIAGNOSIS — B351 Tinea unguium: Secondary | ICD-10-CM | POA: Diagnosis not present

## 2015-10-02 ENCOUNTER — Encounter (HOSPITAL_COMMUNITY): Payer: Self-pay | Admitting: *Deleted

## 2015-10-02 ENCOUNTER — Emergency Department (HOSPITAL_COMMUNITY): Payer: Medicare Other

## 2015-10-02 ENCOUNTER — Emergency Department (HOSPITAL_COMMUNITY)
Admission: EM | Admit: 2015-10-02 | Discharge: 2015-10-02 | Disposition: A | Discharge status: Home / Self Care | Payer: Medicare Other | Attending: Emergency Medicine | Admitting: Emergency Medicine

## 2015-10-02 DIAGNOSIS — Y9389 Activity, other specified: Secondary | ICD-10-CM | POA: Insufficient documentation

## 2015-10-02 DIAGNOSIS — Y9289 Other specified places as the place of occurrence of the external cause: Secondary | ICD-10-CM | POA: Insufficient documentation

## 2015-10-02 DIAGNOSIS — S0990XA Unspecified injury of head, initial encounter: Secondary | ICD-10-CM | POA: Diagnosis present

## 2015-10-02 DIAGNOSIS — Z87440 Personal history of urinary (tract) infections: Secondary | ICD-10-CM | POA: Diagnosis not present

## 2015-10-02 DIAGNOSIS — Z7982 Long term (current) use of aspirin: Secondary | ICD-10-CM | POA: Insufficient documentation

## 2015-10-02 DIAGNOSIS — Z8673 Personal history of transient ischemic attack (TIA), and cerebral infarction without residual deficits: Secondary | ICD-10-CM | POA: Insufficient documentation

## 2015-10-02 DIAGNOSIS — S199XXA Unspecified injury of neck, initial encounter: Secondary | ICD-10-CM | POA: Diagnosis not present

## 2015-10-02 DIAGNOSIS — Y92129 Unspecified place in nursing home as the place of occurrence of the external cause: Secondary | ICD-10-CM

## 2015-10-02 DIAGNOSIS — Z79899 Other long term (current) drug therapy: Secondary | ICD-10-CM | POA: Insufficient documentation

## 2015-10-02 DIAGNOSIS — Z862 Personal history of diseases of the blood and blood-forming organs and certain disorders involving the immune mechanism: Secondary | ICD-10-CM | POA: Diagnosis not present

## 2015-10-02 DIAGNOSIS — R7309 Other abnormal glucose: Secondary | ICD-10-CM | POA: Diagnosis not present

## 2015-10-02 DIAGNOSIS — R739 Hyperglycemia, unspecified: Secondary | ICD-10-CM | POA: Diagnosis not present

## 2015-10-02 DIAGNOSIS — E119 Type 2 diabetes mellitus without complications: Secondary | ICD-10-CM | POA: Diagnosis not present

## 2015-10-02 DIAGNOSIS — Y998 Other external cause status: Secondary | ICD-10-CM | POA: Insufficient documentation

## 2015-10-02 DIAGNOSIS — F0390 Unspecified dementia without behavioral disturbance: Secondary | ICD-10-CM | POA: Insufficient documentation

## 2015-10-02 DIAGNOSIS — W228XXA Striking against or struck by other objects, initial encounter: Secondary | ICD-10-CM | POA: Diagnosis not present

## 2015-10-02 DIAGNOSIS — S0003XA Contusion of scalp, initial encounter: Secondary | ICD-10-CM

## 2015-10-02 DIAGNOSIS — W19XXXA Unspecified fall, initial encounter: Secondary | ICD-10-CM

## 2015-10-02 DIAGNOSIS — I10 Essential (primary) hypertension: Secondary | ICD-10-CM | POA: Diagnosis not present

## 2015-10-02 DIAGNOSIS — S0093XA Contusion of unspecified part of head, initial encounter: Secondary | ICD-10-CM | POA: Diagnosis not present

## 2015-10-02 HISTORY — DX: Repeated falls: R29.6

## 2015-10-02 NOTE — ED Notes (Signed)
[facility] contacted to come pick patient up.

## 2015-10-02 NOTE — Discharge Instructions (Signed)
°Emergency Department Resource Guide °1) Find a Doctor and Pay Out of Pocket °Although you won't have to find out who is covered by your insurance plan, it is a good idea to ask around and get recommendations. You will then need to call the office and see if the doctor you have chosen will accept you as a new patient and what types of options they offer for patients who are self-pay. Some doctors offer discounts or will set up payment plans for their patients who do not have insurance, but you will need to ask so you aren't surprised when you get to your appointment. ° °2) Contact Your Local Health Department °Not all health departments have doctors that can see patients for sick visits, but many do, so it is worth a call to see if yours does. If you don't know where your local health department is, you can check in your phone book. The CDC also has a tool to help you locate your state's health department, and many state websites also have listings of all of their local health departments. ° °3) Find a Walk-in Clinic °If your illness is not likely to be very severe or complicated, you may want to try a walk in clinic. These are popping up all over the country in pharmacies, drugstores, and shopping centers. They're usually staffed by nurse practitioners or physician assistants that have been trained to treat common illnesses and complaints. They're usually fairly quick and inexpensive. However, if you have serious medical issues or chronic medical problems, these are probably not your best option. ° °No Primary Care Doctor: °- Call Health Connect at  832-8000 - they can help you locate a primary care doctor that  accepts your insurance, provides certain services, etc. °- Physician Referral Service- 1-800-533-3463 ° °Chronic Pain Problems: °Organization         Address  Phone   Notes  °[facility]  (336) [phone] Patients need to be referred by their primary care doctor.  ° °Medication  Assistance: °Organization         Address  Phone   Notes  °[place] [facility] [address] °[place], [place] [address] (336) 641-8030 --Must be a resident of [place] °-- Must have NO insurance coverage whatsoever (no Medicaid/ Medicare, etc.) °-- The pt. MUST have a primary care doctor that directs their care regularly and follows them in the community °  °[facility]  (866) 331-1348   °[employer]  (888) 892-1162   ° °Agencies that provide inexpensive medical care: °Organization         Address  Phone   Notes  °[facility]  (336) 832-8035   °[facility]    (336) 832-7272   °[facility] [address] °[place], [place] [address] (336) 832-4777   °[facility] [address], °[place] (336) 271-4999   °[facility]    (336) 373-0678   °[facility]    (336) 272-1050   °[facility] ° [address], [place] Phone:  (336) 832-4444, Fax:  (336) 832-4440 Hours of Operation:  9 am - 6 pm, M-F.  Also accepts Medicaid/Medicare and self-pay.  °[facility] ° [address], [place] Phone: (336) [phone], Fax: (336) [phone]. Hours of Operation:  8:30 am - 5:30 pm, M-F.  Also accepts Medicaid and self-pay.  °[facility] [place] [address], [place] Phone: (336) 878-6027   °[facility] [address], [place], [place] (336)723-1848, Ext. 123 Mondays & Thursdays: 7-9 AM.  First 15 patients are seen on a first come, first serve basis. °  ° °Medicaid-accepting [place] Providers: ° °Organization         Address  Phone   Notes  °[facility] [address], [place] (336) 641-2100 Also accepts self-pay patients.  °[facility] [address], [place] ° (336) 856-9996   °[facility] [address], [place]  (336) 288-8857   °[facility] [address] [place] [address], [place] (336) 299-7000   °Veita Bland [address], [place]  ° (336) 373-1557 Only accepts [place] Access Medicaid patients after they have their name applied to their card.  ° °Self-Pay (no insurance) in [place]: ° °Organization         Address  Phone   Notes  °Sickle Cell Patients, [facility] [address], [place] (336) 832-1970   °[facility] [address], [place] (336) 832-4400   °[facility] ° [address], [place] (336) 992-4800   °[facility]/Dr. Osei-Bonsu ° [address] [place] [address], [place] or [address], [place] (336) [phone] Phone number for both [place] and [place] locations is the same.  °[facility] [address], [place] (336) 299-0000   °[facility] [address] [place] [address], [place] or [address] (336) 852-7530 °(336) 878-2260   °[facility] [address], [place] (336) 350-1642, phone; (336) 294-5005, fax Sees patients 1st and 3rd Saturday of every month.  Must not qualify for public or private insurance (i.e. Medicaid, Medicare, [place] Health Choice, Veterans' Benefits) • Household income should be no more than 200% of the poverty level •The clinic cannot treat you if you are pregnant or think you are pregnant • Sexually transmitted diseases are not treated at the clinic.  ° ° °Dental Care: °Organization         Address  Phone  Notes  °[place] [facility] [address], [place] (336) 641-6152 Accepts children up to age 21 who are enrolled in Medicaid or [place] Health Choice; pregnant women with a Medicaid card; and children who have applied for Medicaid or [place] Health Choice, but were declined, whose parents can pay a reduced fee at time of service.  °[place]  [facility] [place]  [address], [place] (336) 641-7733 Accepts children up to age 21 who are enrolled in Medicaid or [place] Health Choice; pregnant women with a Medicaid card; and children who have applied for Medicaid or [place] Health Choice, but were declined, whose parents can pay a reduced fee at time of service.  °[facility] ° [address], [place] (336) 641-4533 Patients are seen by appointment only. Walk-ins are not accepted. [facility] will see patients 18 years of age and older. °Monday - Tuesday (8am-5pm) °Most Wednesdays (8:30-5pm) °$30 per visit, cash only  °[facility] ° [address], [place] (336) 641-4533 Patients are seen by appointment only. Walk-ins are not accepted. [facility] will see patients 18 years of age and older. °One   Wednesday Evening (Monthly: Volunteer Based).  $30 per visit, cash only  °[facility]  (919) [phone] for adults; Children under age 4, call Graduate Pediatric Dentistry at ([phone]) [phone]. Children aged 4-14, please call ([phone]) [phone] to request a pediatric application. ° Dental services are provided in all areas of dental care including fillings, crowns and bridges, complete and partial dentures, implants, gum treatment, root canals, and extractions. Preventive care is also provided. Treatment is provided to both adults and children. °Patients are selected via a lottery and there is often a waiting list. °  °[facility] [address], °[place] ° (336) 763-8833 [email or web address] °  °[facility] [address], [place], [place] (336)723-1848, Ext. 123 Second and Fourth Thursday of each month, opens at 6:30 AM; Clinic ends at 9 AM.  Patients are seen on a first-come first-served basis, and a limited number are seen during each clinic.  ° °[facility] ° [address], [place], [place] (336) 723-7904    Eligibility Requirements °You must have lived in [place], [place], or [place] counties for at least the last three months. °  You cannot be eligible for state or federal sponsored healthcare insurance, including Veterans Administration, Medicaid, or Medicare. °  You generally cannot be eligible for healthcare insurance through your employer.  °  How to apply: °Eligibility screenings are held every Tuesday and Wednesday afternoon from 1:00 pm until 4:00 pm. You do not need an appointment for the interview!  °[facility] [address], [place], [place] 336-631-2330   °[facility]  336-342-8273   °[facility]  336-703-3100   °[facility]  336-570-6415   ° °Behavioral Health Resources in the Community: °Intensive Outpatient Programs °Organization         Address  Phone  Notes  °[facility] [address], [place], [place] 336-878-6098   °[facility] [address], [place], [place] 336-832-9800   °[facility] [address], [place], [place] ° 336-882-2125   °[facility] [address],  °[place], [place] 1-800-853-5163 or 336-641-4981   °Substance Abuse Resources °Organization         Address  Phone  Notes  °[facility]  336-882-2125   °[facility]  336-784-9470   °[facility]  336-285-9073   °[facility]  336-845-3988   °Residential & Outpatient Substance Abuse Program  1-800-659-3381   °Psychological Services °Organization         Address  Phone  Notes  °[facility]  336- 832-9600   °[facility]  336- 378-7881   °[facility] [address], [place] 1-800-853-5163 or 336-641-4981   ° °Mobile Crisis Teams °Organization         Address  Phone  Notes  °[facility], Mobile Crisis Care Unit  1-877-626-1772   °Assertive °[facility] ° [address]  [place], [place] 336-834-9664   °Sharon DeEsch [address] °[place] 336-554-5454   ° °Self-Help/Support Groups °Organization         Address  Phone             Notes  °[facility]  - variety of support groups  336- 373-1402 Call for more information  °Narcotics Anonymous (NA), [facility] [address], °[place]  2 meetings at this location  ° °  Residential Treatment Programs Organization         Address  Phone  Notes  [facility] [address],    [place]  [phone]   [facility]  [address], [place] [phone], [place], [place] [phone]   [facility] [address], [place] [phone] Admissions: 8am-3pm M-F  [facility] [address]    [place], [place] [phone]   [facility] [address], [place], [place] [phone]   [facility] [address]  [place], [place] [phone]   [facility] Intensive [facility]patient [address], [place], [place] [phone]   [facility] ([facility] [address]  [place], [place] [phone] or [phone]   [facility] ([facility] [address] [place], [place] [phone] Accepts Medicaid  [facility] [address]  [place] [phone] Substance Abuse/Addiction Treatment   [facility]         Address  Phone  Notes  [facility]  289-130-2388(888) [phone]   Angie FavaJulie Brannon, PhD [address] [place], [place]   ([phone](336) [phone] or [phone](336) [phone]   [facility]   [address] [place], [place] [phone]) [phone]   [facility] [address], [place], [place] [phone](336) [phone] Insurance/Medicaid/sponsorship through [facility] [address]                                    [place], [place] [phone](336) [phone] Therapy/tele-psych/case    [facility] [address]   [place], [place] [phone]) [phone]    Dr. Lolly MustacheArfeen  [phone](336) [phone]   [facility]  [employer] [facility] 1) [address], [place] 2) [address], [place] 3)  [address], [place] [phone]) [phone](336) [phone]  [phone]) [phone]   [facility] Child Abuse Hotline 365-688-4887(336) [phone] or [phone](336) [phone] (After Hours)      Take your usual prescriptions as previously directed.  Call your regular medical doctor tomorrow to schedule a follow up appointment within the next 2 to 3 days.  Return to the Emergency Department immediately sooner if worsening.

## 2015-10-02 NOTE — ED Notes (Signed)
[facility]  Assisted fall this evening striking back of head.

## 2015-10-02 NOTE — ED Notes (Signed)
Daughter states understanding of care given and follow up instructions.  [facility] via their transportation

## 2015-10-02 NOTE — ED Provider Notes (Signed)
CSN: [id number]     Arrival date & time 10/02/15  2132 History   First MD Initiated Contact with Patient 10/02/15 2144     Chief Complaint  Patient presents with  . Fall      Patient is a 82 y.o. female presenting with fall. The history is provided by the patient, the nursing home, the EMS personnel and a relative. The history is limited by the condition of the patient (Hx dementia).  Fall  Pt was seen at 2155. Per EMS, NH report, and pt's family: Pt fell backwards while getting into bed PTA. Pt "hit her head on something," "maybe the dresser." No reported LOC, no AMS from baseline, no focal motor weakness. Pt has significant hx of dementia and frequent falls per family.    Past Medical History  Diagnosis Date  . Depression   . Dementia   . HTN (hypertension) 07/02/2011  . DM type 2 (diabetes mellitus, type 2) (HCC) 07/02/2011  . UTI (lower urinary tract infection)   . Thrombocytopenia (HCC)   . Elevated LFTs   . Cerebrovascular disease   . Frequent falls    Past Surgical History  Procedure Laterality Date  . Abdominal hysterectomy    . Cholecystectomy     Family History  Problem Relation Age of Onset  . Cancer Mother   . Heart disease Father    Social History  Substance Use Topics  . Smoking status: Never Smoker   . Smokeless tobacco: Never Used  . Alcohol Use: No    Review of Systems  Unable to perform ROS: Dementia     Allergies  Review of patient's allergies indicates no known allergies.  Home Medications   Prior to Admission medications   Medication Sig Start Date End Date Taking? Authorizing Provider  acetaminophen (TYLENOL) 500 MG tablet Take 1,000 mg by mouth every 8 (eight) hours as needed.    Historical Provider, MD  aspirin EC 81 MG tablet Take 81 mg by mouth daily.    Historical Provider, MD  azithromycin (ZITHROMAX Z-PAK) 250 MG tablet Take 2 tablets on day 1 then 1 tablet a day until gone 02/05/15   Nelva Nayobert Beaton, MD  cetaphil (CETAPHIL) lotion  Apply 1 application topically 2 (two) times a week. On Wednesday and Saturday    Historical Provider, MD  cetirizine (ZYRTEC) 10 MG tablet Take 10 mg by mouth at bedtime.    Historical Provider, MD  donepezil (ARICEPT) 5 MG tablet Take 5 mg by mouth daily.     Historical Provider, MD  levothyroxine (SYNTHROID, LEVOTHROID) 25 MCG tablet Take 25 mcg by mouth daily before breakfast.    Historical Provider, MD  lisinopril (PRINIVIL,ZESTRIL) 20 MG tablet Take 20 mg by mouth daily.    Historical Provider, MD  megestrol (MEGACE) 40 MG tablet Take 40 mg by mouth daily.    Historical Provider, MD  Multiple Vitamin (DAILY VITE PO) Take 1 tablet by mouth 2 (two) times daily.     Historical Provider, MD  vitamin C (ASCORBIC ACID) 500 MG tablet Take 1,000 mg by mouth daily.    Historical Provider, MD  zinc sulfate 220 MG capsule Take 220 mg by mouth daily.    Historical Provider, MD   BP 154/42 mmHg  Pulse 72  Temp(Src) 98.5 F (36.9 C) (Oral)  Resp 18  Ht 5\' 2"  (1.575 m)  Wt 160 lb (72.576 kg)  BMI 29.26 kg/m2  SpO2 100% Physical Exam  2200: Physical examination:  Nursing notes reviewed;  Vital signs and O2 SAT reviewed;  Constitutional: Well developed, Well nourished, Well hydrated, In no acute distress; Head:  Normocephalic, +contusion proximal-posterior scalp. No open wounds.; Eyes: EOMI, PERRL, No scleral icterus; ENMT: Mouth and pharynx normal, Mucous membranes moist; Neck: Supple, Full range of motion, No lymphadenopathy; Cardiovascular: Regular rate and rhythm, No gallop; Respiratory: Breath sounds clear & equal bilaterally, No wheezes.  Speaking full sentences with ease, Normal respiratory effort/excursion; Chest: Nontender, Movement normal; Abdomen: Soft, Nontender, Nondistended, Normal bowel sounds; Genitourinary: No CVA tenderness; Spine:  No midline CS, TS, LS tenderness.;; Extremities: Pulses normal, Pelvis stable. No tenderness, No edema, No calf edema or asymmetry.; Neuro: Awake, alert,  confused per hx dementia. Major CN grossly intact. No facial droop. Speech clear. Grips equal. Strength 5/5 equal bilat UE's and LE's. Pt moves all extremities spontaneously and to command without apparent gross focal motor deficits.; Skin: Color normal, Warm, Dry.   ED Course  Procedures (including critical care time)  Labs Review  Imaging Review  I have personally reviewed and evaluated these images and lab results as part of my medical decision-making.   EKG Interpretation None      MDM  MDM Reviewed: previous chart, nursing note and vitals Interpretation: CT scan     Ct Head Wo Contrast 10/02/2015  CLINICAL DATA:  Fall this evening striking back of head. EXAM: CT HEAD WITHOUT CONTRAST CT CERVICAL SPINE WITHOUT CONTRAST TECHNIQUE: Multidetector CT imaging of the head and cervical spine was performed following the standard protocol without intravenous contrast. Multiplanar CT image reconstructions of the cervical spine were also generated. COMPARISON:  02/05/2015 FINDINGS: CT HEAD FINDINGS Stable atrophy and chronic small vessel ischemia.No intracranial hemorrhage, mass effect, or midline shift. No hydrocephalus. The basilar cisterns are patent. No evidence of territorial infarct. No intracranial fluid collection. Calvarium is intact. Improved paranasal sinus inflammation with minimal residual mucosal thickening of the left maxillary sinus. Mastoid air cells are well aerated. CT CERVICAL SPINE FINDINGS Exaggerated cervical lordosis, no listhesis. Vertebral body heights are preserved. There is no fracture. The dens is intact. There are no jumped or perched facets. There is disc space narrowing at C5-C6 with associated endplate spurs. Scattered facet arthropathy. No prevertebral soft tissue edema. IMPRESSION: 1. Stable atrophy and chronic small vessel ischemia without acute intracranial abnormality. 2. Degenerative change in the cervical spine without acute fracture or subluxation.  Electronically Signed   By: Rubye Oaks M.D.   On: 10/02/2015 22:57   Ct Cervical Spine Wo Contrast 10/02/2015  CLINICAL DATA:  Fall this evening striking back of head. EXAM: CT HEAD WITHOUT CONTRAST CT CERVICAL SPINE WITHOUT CONTRAST TECHNIQUE: Multidetector CT imaging of the head and cervical spine was performed following the standard protocol without intravenous contrast. Multiplanar CT image reconstructions of the cervical spine were also generated. COMPARISON:  02/05/2015 FINDINGS: CT HEAD FINDINGS Stable atrophy and chronic small vessel ischemia.No intracranial hemorrhage, mass effect, or midline shift. No hydrocephalus. The basilar cisterns are patent. No evidence of territorial infarct. No intracranial fluid collection. Calvarium is intact. Improved paranasal sinus inflammation with minimal residual mucosal thickening of the left maxillary sinus. Mastoid air cells are well aerated. CT CERVICAL SPINE FINDINGS Exaggerated cervical lordosis, no listhesis. Vertebral body heights are preserved. There is no fracture. The dens is intact. There are no jumped or perched facets. There is disc space narrowing at C5-C6 with associated endplate spurs. Scattered facet arthropathy. No prevertebral soft tissue edema. IMPRESSION: 1. Stable atrophy and chronic small vessel ischemia without acute intracranial  abnormality. 2. Degenerative change in the cervical spine without acute fracture or subluxation. Electronically Signed   By: Rubye Oaks M.D.   On: 10/02/2015 22:57    2305:  CT scans reassuring. Pt continues to act per her baseline, per family at bedside.  Family would like pt to be d/c back to NH. Dx and testing d/w pt's family.  Questions answered.  Verb understanding, agreeable to d/c home with outpt f/u.   Samuel Jester, DO 10/06/15 1538

## 2015-10-02 NOTE — ED Notes (Signed)
Pt to department via EMS.  [facility] [address] [facility]. Pt had assisted fall at residence.  Pt did strike head.  Per EMS BS was 305.

## 2015-10-04 ENCOUNTER — Encounter (HOSPITAL_COMMUNITY): Payer: Self-pay | Admitting: *Deleted

## 2015-10-04 DIAGNOSIS — Z9181 History of falling: Secondary | ICD-10-CM | POA: Diagnosis not present

## 2015-10-04 DIAGNOSIS — F0391 Unspecified dementia with behavioral disturbance: Secondary | ICD-10-CM | POA: Diagnosis not present

## 2015-10-04 DIAGNOSIS — B309 Viral conjunctivitis, unspecified: Secondary | ICD-10-CM | POA: Diagnosis not present

## 2015-10-12 DIAGNOSIS — Z7982 Long term (current) use of aspirin: Secondary | ICD-10-CM | POA: Diagnosis not present

## 2015-10-12 DIAGNOSIS — E119 Type 2 diabetes mellitus without complications: Secondary | ICD-10-CM | POA: Diagnosis not present

## 2015-10-12 DIAGNOSIS — Z9181 History of falling: Secondary | ICD-10-CM | POA: Diagnosis not present

## 2015-10-12 DIAGNOSIS — R260 Ataxic gait: Secondary | ICD-10-CM | POA: Diagnosis not present

## 2015-10-12 DIAGNOSIS — I679 Cerebrovascular disease, unspecified: Secondary | ICD-10-CM | POA: Diagnosis not present

## 2015-10-12 DIAGNOSIS — F0390 Unspecified dementia without behavioral disturbance: Secondary | ICD-10-CM | POA: Diagnosis not present

## 2015-10-12 DIAGNOSIS — R41841 Cognitive communication deficit: Secondary | ICD-10-CM | POA: Diagnosis not present

## 2015-10-12 DIAGNOSIS — D696 Thrombocytopenia, unspecified: Secondary | ICD-10-CM | POA: Diagnosis not present

## 2015-10-12 DIAGNOSIS — Z79818 Long term (current) use of other agents affecting estrogen receptors and estrogen levels: Secondary | ICD-10-CM | POA: Diagnosis not present

## 2015-10-12 DIAGNOSIS — I10 Essential (primary) hypertension: Secondary | ICD-10-CM | POA: Diagnosis not present

## 2015-10-12 DIAGNOSIS — M6281 Muscle weakness (generalized): Secondary | ICD-10-CM | POA: Diagnosis not present

## 2015-10-15 DIAGNOSIS — M6281 Muscle weakness (generalized): Secondary | ICD-10-CM | POA: Diagnosis not present

## 2015-10-15 DIAGNOSIS — E119 Type 2 diabetes mellitus without complications: Secondary | ICD-10-CM | POA: Diagnosis not present

## 2015-10-15 DIAGNOSIS — R260 Ataxic gait: Secondary | ICD-10-CM | POA: Diagnosis not present

## 2015-10-15 DIAGNOSIS — I679 Cerebrovascular disease, unspecified: Secondary | ICD-10-CM | POA: Diagnosis not present

## 2015-10-15 DIAGNOSIS — R41841 Cognitive communication deficit: Secondary | ICD-10-CM | POA: Diagnosis not present

## 2015-10-15 DIAGNOSIS — F0390 Unspecified dementia without behavioral disturbance: Secondary | ICD-10-CM | POA: Diagnosis not present

## 2015-10-16 DIAGNOSIS — R41841 Cognitive communication deficit: Secondary | ICD-10-CM | POA: Diagnosis not present

## 2015-10-16 DIAGNOSIS — I679 Cerebrovascular disease, unspecified: Secondary | ICD-10-CM | POA: Diagnosis not present

## 2015-10-16 DIAGNOSIS — E119 Type 2 diabetes mellitus without complications: Secondary | ICD-10-CM | POA: Diagnosis not present

## 2015-10-16 DIAGNOSIS — M6281 Muscle weakness (generalized): Secondary | ICD-10-CM | POA: Diagnosis not present

## 2015-10-16 DIAGNOSIS — F0390 Unspecified dementia without behavioral disturbance: Secondary | ICD-10-CM | POA: Diagnosis not present

## 2015-10-16 DIAGNOSIS — R260 Ataxic gait: Secondary | ICD-10-CM | POA: Diagnosis not present

## 2015-10-18 DIAGNOSIS — E119 Type 2 diabetes mellitus without complications: Secondary | ICD-10-CM | POA: Diagnosis not present

## 2015-10-18 DIAGNOSIS — R260 Ataxic gait: Secondary | ICD-10-CM | POA: Diagnosis not present

## 2015-10-18 DIAGNOSIS — I679 Cerebrovascular disease, unspecified: Secondary | ICD-10-CM | POA: Diagnosis not present

## 2015-10-18 DIAGNOSIS — R41841 Cognitive communication deficit: Secondary | ICD-10-CM | POA: Diagnosis not present

## 2015-10-18 DIAGNOSIS — M6281 Muscle weakness (generalized): Secondary | ICD-10-CM | POA: Diagnosis not present

## 2015-10-18 DIAGNOSIS — F0390 Unspecified dementia without behavioral disturbance: Secondary | ICD-10-CM | POA: Diagnosis not present

## 2015-10-19 DIAGNOSIS — R260 Ataxic gait: Secondary | ICD-10-CM | POA: Diagnosis not present

## 2015-10-19 DIAGNOSIS — M6281 Muscle weakness (generalized): Secondary | ICD-10-CM | POA: Diagnosis not present

## 2015-10-19 DIAGNOSIS — F0390 Unspecified dementia without behavioral disturbance: Secondary | ICD-10-CM | POA: Diagnosis not present

## 2015-10-19 DIAGNOSIS — R41841 Cognitive communication deficit: Secondary | ICD-10-CM | POA: Diagnosis not present

## 2015-10-19 DIAGNOSIS — I679 Cerebrovascular disease, unspecified: Secondary | ICD-10-CM | POA: Diagnosis not present

## 2015-10-19 DIAGNOSIS — E119 Type 2 diabetes mellitus without complications: Secondary | ICD-10-CM | POA: Diagnosis not present

## 2015-10-23 DIAGNOSIS — I679 Cerebrovascular disease, unspecified: Secondary | ICD-10-CM | POA: Diagnosis not present

## 2015-10-23 DIAGNOSIS — F0390 Unspecified dementia without behavioral disturbance: Secondary | ICD-10-CM | POA: Diagnosis not present

## 2015-10-23 DIAGNOSIS — E119 Type 2 diabetes mellitus without complications: Secondary | ICD-10-CM | POA: Diagnosis not present

## 2015-10-23 DIAGNOSIS — M6281 Muscle weakness (generalized): Secondary | ICD-10-CM | POA: Diagnosis not present

## 2015-10-23 DIAGNOSIS — R41841 Cognitive communication deficit: Secondary | ICD-10-CM | POA: Diagnosis not present

## 2015-10-23 DIAGNOSIS — R260 Ataxic gait: Secondary | ICD-10-CM | POA: Diagnosis not present

## 2015-10-25 DIAGNOSIS — E119 Type 2 diabetes mellitus without complications: Secondary | ICD-10-CM | POA: Diagnosis not present

## 2015-10-25 DIAGNOSIS — R260 Ataxic gait: Secondary | ICD-10-CM | POA: Diagnosis not present

## 2015-10-25 DIAGNOSIS — R41841 Cognitive communication deficit: Secondary | ICD-10-CM | POA: Diagnosis not present

## 2015-10-25 DIAGNOSIS — F0390 Unspecified dementia without behavioral disturbance: Secondary | ICD-10-CM | POA: Diagnosis not present

## 2015-10-25 DIAGNOSIS — M6281 Muscle weakness (generalized): Secondary | ICD-10-CM | POA: Diagnosis not present

## 2015-10-25 DIAGNOSIS — I679 Cerebrovascular disease, unspecified: Secondary | ICD-10-CM | POA: Diagnosis not present

## 2015-10-30 DIAGNOSIS — E119 Type 2 diabetes mellitus without complications: Secondary | ICD-10-CM | POA: Diagnosis not present

## 2015-10-30 DIAGNOSIS — I679 Cerebrovascular disease, unspecified: Secondary | ICD-10-CM | POA: Diagnosis not present

## 2015-10-30 DIAGNOSIS — M6281 Muscle weakness (generalized): Secondary | ICD-10-CM | POA: Diagnosis not present

## 2015-10-30 DIAGNOSIS — R41841 Cognitive communication deficit: Secondary | ICD-10-CM | POA: Diagnosis not present

## 2015-10-30 DIAGNOSIS — R260 Ataxic gait: Secondary | ICD-10-CM | POA: Diagnosis not present

## 2015-10-30 DIAGNOSIS — F0390 Unspecified dementia without behavioral disturbance: Secondary | ICD-10-CM | POA: Diagnosis not present

## 2015-11-01 DIAGNOSIS — M6281 Muscle weakness (generalized): Secondary | ICD-10-CM | POA: Diagnosis not present

## 2015-11-01 DIAGNOSIS — I679 Cerebrovascular disease, unspecified: Secondary | ICD-10-CM | POA: Diagnosis not present

## 2015-11-01 DIAGNOSIS — R260 Ataxic gait: Secondary | ICD-10-CM | POA: Diagnosis not present

## 2015-11-01 DIAGNOSIS — F0390 Unspecified dementia without behavioral disturbance: Secondary | ICD-10-CM | POA: Diagnosis not present

## 2015-11-01 DIAGNOSIS — R41841 Cognitive communication deficit: Secondary | ICD-10-CM | POA: Diagnosis not present

## 2015-11-01 DIAGNOSIS — E119 Type 2 diabetes mellitus without complications: Secondary | ICD-10-CM | POA: Diagnosis not present

## 2015-11-05 DIAGNOSIS — R260 Ataxic gait: Secondary | ICD-10-CM | POA: Diagnosis not present

## 2015-11-05 DIAGNOSIS — I679 Cerebrovascular disease, unspecified: Secondary | ICD-10-CM | POA: Diagnosis not present

## 2015-11-05 DIAGNOSIS — E119 Type 2 diabetes mellitus without complications: Secondary | ICD-10-CM | POA: Diagnosis not present

## 2015-11-05 DIAGNOSIS — M6281 Muscle weakness (generalized): Secondary | ICD-10-CM | POA: Diagnosis not present

## 2015-11-05 DIAGNOSIS — F0390 Unspecified dementia without behavioral disturbance: Secondary | ICD-10-CM | POA: Diagnosis not present

## 2015-11-05 DIAGNOSIS — R41841 Cognitive communication deficit: Secondary | ICD-10-CM | POA: Diagnosis not present

## 2015-11-07 DIAGNOSIS — E119 Type 2 diabetes mellitus without complications: Secondary | ICD-10-CM | POA: Diagnosis not present

## 2015-11-07 DIAGNOSIS — F0390 Unspecified dementia without behavioral disturbance: Secondary | ICD-10-CM | POA: Diagnosis not present

## 2015-11-07 DIAGNOSIS — R260 Ataxic gait: Secondary | ICD-10-CM | POA: Diagnosis not present

## 2015-11-07 DIAGNOSIS — R41841 Cognitive communication deficit: Secondary | ICD-10-CM | POA: Diagnosis not present

## 2015-11-07 DIAGNOSIS — M6281 Muscle weakness (generalized): Secondary | ICD-10-CM | POA: Diagnosis not present

## 2015-11-07 DIAGNOSIS — I679 Cerebrovascular disease, unspecified: Secondary | ICD-10-CM | POA: Diagnosis not present

## 2015-11-12 DIAGNOSIS — R41841 Cognitive communication deficit: Secondary | ICD-10-CM | POA: Diagnosis not present

## 2015-11-12 DIAGNOSIS — F0390 Unspecified dementia without behavioral disturbance: Secondary | ICD-10-CM | POA: Diagnosis not present

## 2015-11-12 DIAGNOSIS — I679 Cerebrovascular disease, unspecified: Secondary | ICD-10-CM | POA: Diagnosis not present

## 2015-11-12 DIAGNOSIS — R260 Ataxic gait: Secondary | ICD-10-CM | POA: Diagnosis not present

## 2015-11-12 DIAGNOSIS — E119 Type 2 diabetes mellitus without complications: Secondary | ICD-10-CM | POA: Diagnosis not present

## 2015-11-12 DIAGNOSIS — M6281 Muscle weakness (generalized): Secondary | ICD-10-CM | POA: Diagnosis not present

## 2015-11-15 DIAGNOSIS — I679 Cerebrovascular disease, unspecified: Secondary | ICD-10-CM | POA: Diagnosis not present

## 2015-11-15 DIAGNOSIS — F0390 Unspecified dementia without behavioral disturbance: Secondary | ICD-10-CM | POA: Diagnosis not present

## 2015-11-15 DIAGNOSIS — M6281 Muscle weakness (generalized): Secondary | ICD-10-CM | POA: Diagnosis not present

## 2015-11-15 DIAGNOSIS — E119 Type 2 diabetes mellitus without complications: Secondary | ICD-10-CM | POA: Diagnosis not present

## 2015-11-15 DIAGNOSIS — R260 Ataxic gait: Secondary | ICD-10-CM | POA: Diagnosis not present

## 2015-11-15 DIAGNOSIS — R41841 Cognitive communication deficit: Secondary | ICD-10-CM | POA: Diagnosis not present

## 2015-11-19 DIAGNOSIS — R41841 Cognitive communication deficit: Secondary | ICD-10-CM | POA: Diagnosis not present

## 2015-11-19 DIAGNOSIS — I679 Cerebrovascular disease, unspecified: Secondary | ICD-10-CM | POA: Diagnosis not present

## 2015-11-19 DIAGNOSIS — F0390 Unspecified dementia without behavioral disturbance: Secondary | ICD-10-CM | POA: Diagnosis not present

## 2015-11-19 DIAGNOSIS — R260 Ataxic gait: Secondary | ICD-10-CM | POA: Diagnosis not present

## 2015-11-19 DIAGNOSIS — E119 Type 2 diabetes mellitus without complications: Secondary | ICD-10-CM | POA: Diagnosis not present

## 2015-11-19 DIAGNOSIS — M6281 Muscle weakness (generalized): Secondary | ICD-10-CM | POA: Diagnosis not present

## 2015-11-21 DIAGNOSIS — E1151 Type 2 diabetes mellitus with diabetic peripheral angiopathy without gangrene: Secondary | ICD-10-CM | POA: Diagnosis not present

## 2015-11-21 DIAGNOSIS — L84 Corns and callosities: Secondary | ICD-10-CM | POA: Diagnosis not present

## 2015-11-22 DIAGNOSIS — M6281 Muscle weakness (generalized): Secondary | ICD-10-CM | POA: Diagnosis not present

## 2015-11-22 DIAGNOSIS — I679 Cerebrovascular disease, unspecified: Secondary | ICD-10-CM | POA: Diagnosis not present

## 2015-11-22 DIAGNOSIS — E119 Type 2 diabetes mellitus without complications: Secondary | ICD-10-CM | POA: Diagnosis not present

## 2015-11-22 DIAGNOSIS — R41841 Cognitive communication deficit: Secondary | ICD-10-CM | POA: Diagnosis not present

## 2015-11-22 DIAGNOSIS — R260 Ataxic gait: Secondary | ICD-10-CM | POA: Diagnosis not present

## 2015-11-22 DIAGNOSIS — F0390 Unspecified dementia without behavioral disturbance: Secondary | ICD-10-CM | POA: Diagnosis not present

## 2016-01-22 DIAGNOSIS — E119 Type 2 diabetes mellitus without complications: Secondary | ICD-10-CM | POA: Diagnosis not present

## 2016-01-22 DIAGNOSIS — E039 Hypothyroidism, unspecified: Secondary | ICD-10-CM | POA: Diagnosis not present

## 2016-01-22 DIAGNOSIS — I10 Essential (primary) hypertension: Secondary | ICD-10-CM | POA: Diagnosis not present

## 2016-04-30 DIAGNOSIS — L84 Corns and callosities: Secondary | ICD-10-CM | POA: Diagnosis not present

## 2016-04-30 DIAGNOSIS — E1151 Type 2 diabetes mellitus with diabetic peripheral angiopathy without gangrene: Secondary | ICD-10-CM | POA: Diagnosis not present

## 2016-06-17 DIAGNOSIS — Z23 Encounter for immunization: Secondary | ICD-10-CM | POA: Diagnosis not present

## 2016-07-15 DIAGNOSIS — L84 Corns and callosities: Secondary | ICD-10-CM | POA: Diagnosis not present

## 2016-07-15 DIAGNOSIS — E1151 Type 2 diabetes mellitus with diabetic peripheral angiopathy without gangrene: Secondary | ICD-10-CM | POA: Diagnosis not present

## 2016-08-07 DIAGNOSIS — I10 Essential (primary) hypertension: Secondary | ICD-10-CM | POA: Diagnosis not present

## 2016-08-07 DIAGNOSIS — E039 Hypothyroidism, unspecified: Secondary | ICD-10-CM | POA: Diagnosis not present

## 2016-08-07 DIAGNOSIS — F0391 Unspecified dementia with behavioral disturbance: Secondary | ICD-10-CM | POA: Diagnosis not present

## 2016-08-07 DIAGNOSIS — R635 Abnormal weight gain: Secondary | ICD-10-CM | POA: Diagnosis not present

## 2016-08-07 DIAGNOSIS — E119 Type 2 diabetes mellitus without complications: Secondary | ICD-10-CM | POA: Diagnosis not present

## 2016-08-07 DIAGNOSIS — M6281 Muscle weakness (generalized): Secondary | ICD-10-CM | POA: Diagnosis not present

## 2016-08-12 DIAGNOSIS — F0391 Unspecified dementia with behavioral disturbance: Secondary | ICD-10-CM | POA: Diagnosis not present

## 2016-08-12 DIAGNOSIS — M6281 Muscle weakness (generalized): Secondary | ICD-10-CM | POA: Diagnosis not present

## 2016-08-12 DIAGNOSIS — E119 Type 2 diabetes mellitus without complications: Secondary | ICD-10-CM | POA: Diagnosis not present

## 2016-08-12 DIAGNOSIS — E039 Hypothyroidism, unspecified: Secondary | ICD-10-CM | POA: Diagnosis not present

## 2016-08-12 DIAGNOSIS — Z993 Dependence on wheelchair: Secondary | ICD-10-CM | POA: Diagnosis not present

## 2016-08-12 DIAGNOSIS — Z9181 History of falling: Secondary | ICD-10-CM | POA: Diagnosis not present

## 2016-08-12 DIAGNOSIS — I10 Essential (primary) hypertension: Secondary | ICD-10-CM | POA: Diagnosis not present

## 2016-08-12 DIAGNOSIS — Z7982 Long term (current) use of aspirin: Secondary | ICD-10-CM | POA: Diagnosis not present

## 2016-08-15 DIAGNOSIS — F0391 Unspecified dementia with behavioral disturbance: Secondary | ICD-10-CM | POA: Diagnosis not present

## 2016-08-15 DIAGNOSIS — E039 Hypothyroidism, unspecified: Secondary | ICD-10-CM | POA: Diagnosis not present

## 2016-08-15 DIAGNOSIS — M6281 Muscle weakness (generalized): Secondary | ICD-10-CM | POA: Diagnosis not present

## 2016-08-15 DIAGNOSIS — I10 Essential (primary) hypertension: Secondary | ICD-10-CM | POA: Diagnosis not present

## 2016-08-15 DIAGNOSIS — Z7982 Long term (current) use of aspirin: Secondary | ICD-10-CM | POA: Diagnosis not present

## 2016-08-15 DIAGNOSIS — E119 Type 2 diabetes mellitus without complications: Secondary | ICD-10-CM | POA: Diagnosis not present

## 2016-08-18 DIAGNOSIS — Z7982 Long term (current) use of aspirin: Secondary | ICD-10-CM | POA: Diagnosis not present

## 2016-08-18 DIAGNOSIS — M6281 Muscle weakness (generalized): Secondary | ICD-10-CM | POA: Diagnosis not present

## 2016-08-18 DIAGNOSIS — I10 Essential (primary) hypertension: Secondary | ICD-10-CM | POA: Diagnosis not present

## 2016-08-18 DIAGNOSIS — F0391 Unspecified dementia with behavioral disturbance: Secondary | ICD-10-CM | POA: Diagnosis not present

## 2016-08-18 DIAGNOSIS — E039 Hypothyroidism, unspecified: Secondary | ICD-10-CM | POA: Diagnosis not present

## 2016-08-18 DIAGNOSIS — E119 Type 2 diabetes mellitus without complications: Secondary | ICD-10-CM | POA: Diagnosis not present

## 2016-08-20 DIAGNOSIS — Z7982 Long term (current) use of aspirin: Secondary | ICD-10-CM | POA: Diagnosis not present

## 2016-08-20 DIAGNOSIS — E119 Type 2 diabetes mellitus without complications: Secondary | ICD-10-CM | POA: Diagnosis not present

## 2016-08-20 DIAGNOSIS — I10 Essential (primary) hypertension: Secondary | ICD-10-CM | POA: Diagnosis not present

## 2016-08-20 DIAGNOSIS — M6281 Muscle weakness (generalized): Secondary | ICD-10-CM | POA: Diagnosis not present

## 2016-08-20 DIAGNOSIS — F0391 Unspecified dementia with behavioral disturbance: Secondary | ICD-10-CM | POA: Diagnosis not present

## 2016-08-20 DIAGNOSIS — E039 Hypothyroidism, unspecified: Secondary | ICD-10-CM | POA: Diagnosis not present

## 2016-08-25 DIAGNOSIS — F0391 Unspecified dementia with behavioral disturbance: Secondary | ICD-10-CM | POA: Diagnosis not present

## 2016-08-25 DIAGNOSIS — M6281 Muscle weakness (generalized): Secondary | ICD-10-CM | POA: Diagnosis not present

## 2016-08-25 DIAGNOSIS — I10 Essential (primary) hypertension: Secondary | ICD-10-CM | POA: Diagnosis not present

## 2016-08-25 DIAGNOSIS — E119 Type 2 diabetes mellitus without complications: Secondary | ICD-10-CM | POA: Diagnosis not present

## 2016-08-25 DIAGNOSIS — E039 Hypothyroidism, unspecified: Secondary | ICD-10-CM | POA: Diagnosis not present

## 2016-08-25 DIAGNOSIS — Z7982 Long term (current) use of aspirin: Secondary | ICD-10-CM | POA: Diagnosis not present

## 2016-08-27 DIAGNOSIS — Z7982 Long term (current) use of aspirin: Secondary | ICD-10-CM | POA: Diagnosis not present

## 2016-08-27 DIAGNOSIS — F0391 Unspecified dementia with behavioral disturbance: Secondary | ICD-10-CM | POA: Diagnosis not present

## 2016-08-27 DIAGNOSIS — E119 Type 2 diabetes mellitus without complications: Secondary | ICD-10-CM | POA: Diagnosis not present

## 2016-08-27 DIAGNOSIS — E039 Hypothyroidism, unspecified: Secondary | ICD-10-CM | POA: Diagnosis not present

## 2016-08-27 DIAGNOSIS — I10 Essential (primary) hypertension: Secondary | ICD-10-CM | POA: Diagnosis not present

## 2016-08-27 DIAGNOSIS — M6281 Muscle weakness (generalized): Secondary | ICD-10-CM | POA: Diagnosis not present

## 2016-09-02 DIAGNOSIS — E119 Type 2 diabetes mellitus without complications: Secondary | ICD-10-CM | POA: Diagnosis not present

## 2016-09-02 DIAGNOSIS — M6281 Muscle weakness (generalized): Secondary | ICD-10-CM | POA: Diagnosis not present

## 2016-09-02 DIAGNOSIS — Z7982 Long term (current) use of aspirin: Secondary | ICD-10-CM | POA: Diagnosis not present

## 2016-09-02 DIAGNOSIS — I10 Essential (primary) hypertension: Secondary | ICD-10-CM | POA: Diagnosis not present

## 2016-09-02 DIAGNOSIS — F0391 Unspecified dementia with behavioral disturbance: Secondary | ICD-10-CM | POA: Diagnosis not present

## 2016-09-02 DIAGNOSIS — E039 Hypothyroidism, unspecified: Secondary | ICD-10-CM | POA: Diagnosis not present

## 2016-09-04 DIAGNOSIS — M6281 Muscle weakness (generalized): Secondary | ICD-10-CM | POA: Diagnosis not present

## 2016-09-04 DIAGNOSIS — F0391 Unspecified dementia with behavioral disturbance: Secondary | ICD-10-CM | POA: Diagnosis not present

## 2016-09-04 DIAGNOSIS — I10 Essential (primary) hypertension: Secondary | ICD-10-CM | POA: Diagnosis not present

## 2016-09-04 DIAGNOSIS — E039 Hypothyroidism, unspecified: Secondary | ICD-10-CM | POA: Diagnosis not present

## 2016-09-04 DIAGNOSIS — E119 Type 2 diabetes mellitus without complications: Secondary | ICD-10-CM | POA: Diagnosis not present

## 2016-09-04 DIAGNOSIS — Z7982 Long term (current) use of aspirin: Secondary | ICD-10-CM | POA: Diagnosis not present

## 2016-09-30 DIAGNOSIS — E1151 Type 2 diabetes mellitus with diabetic peripheral angiopathy without gangrene: Secondary | ICD-10-CM | POA: Diagnosis not present

## 2016-09-30 DIAGNOSIS — L84 Corns and callosities: Secondary | ICD-10-CM | POA: Diagnosis not present

## 2016-12-16 DIAGNOSIS — L84 Corns and callosities: Secondary | ICD-10-CM | POA: Diagnosis not present

## 2016-12-16 DIAGNOSIS — E1151 Type 2 diabetes mellitus with diabetic peripheral angiopathy without gangrene: Secondary | ICD-10-CM | POA: Diagnosis not present

## 2017-03-03 DIAGNOSIS — E1151 Type 2 diabetes mellitus with diabetic peripheral angiopathy without gangrene: Secondary | ICD-10-CM | POA: Diagnosis not present

## 2017-03-03 DIAGNOSIS — L84 Corns and callosities: Secondary | ICD-10-CM | POA: Diagnosis not present

## 2017-04-02 DIAGNOSIS — R21 Rash and other nonspecific skin eruption: Secondary | ICD-10-CM | POA: Diagnosis not present

## 2017-04-02 DIAGNOSIS — L282 Other prurigo: Secondary | ICD-10-CM | POA: Diagnosis not present

## 2017-05-27 DIAGNOSIS — E1151 Type 2 diabetes mellitus with diabetic peripheral angiopathy without gangrene: Secondary | ICD-10-CM | POA: Diagnosis not present

## 2017-05-27 DIAGNOSIS — L84 Corns and callosities: Secondary | ICD-10-CM | POA: Diagnosis not present

## 2017-06-18 DIAGNOSIS — Z23 Encounter for immunization: Secondary | ICD-10-CM | POA: Diagnosis not present

## 2017-08-11 DIAGNOSIS — L84 Corns and callosities: Secondary | ICD-10-CM | POA: Diagnosis not present

## 2017-08-11 DIAGNOSIS — E1151 Type 2 diabetes mellitus with diabetic peripheral angiopathy without gangrene: Secondary | ICD-10-CM | POA: Diagnosis not present

## 2017-11-30 DIAGNOSIS — J09X2 Influenza due to identified novel influenza A virus with other respiratory manifestations: Secondary | ICD-10-CM | POA: Diagnosis not present

## 2018-01-28 DIAGNOSIS — M79674 Pain in right toe(s): Secondary | ICD-10-CM | POA: Diagnosis not present

## 2018-01-28 DIAGNOSIS — B351 Tinea unguium: Secondary | ICD-10-CM | POA: Diagnosis not present

## 2018-04-15 DIAGNOSIS — M79674 Pain in right toe(s): Secondary | ICD-10-CM | POA: Diagnosis not present

## 2018-04-15 DIAGNOSIS — E1151 Type 2 diabetes mellitus with diabetic peripheral angiopathy without gangrene: Secondary | ICD-10-CM | POA: Diagnosis not present

## 2018-04-15 DIAGNOSIS — B351 Tinea unguium: Secondary | ICD-10-CM | POA: Diagnosis not present

## 2018-06-12 DIAGNOSIS — Z23 Encounter for immunization: Secondary | ICD-10-CM | POA: Diagnosis not present

## 2018-07-08 DIAGNOSIS — E1151 Type 2 diabetes mellitus with diabetic peripheral angiopathy without gangrene: Secondary | ICD-10-CM | POA: Diagnosis not present

## 2018-07-08 DIAGNOSIS — L84 Corns and callosities: Secondary | ICD-10-CM | POA: Diagnosis not present

## 2018-08-05 DIAGNOSIS — F0390 Unspecified dementia without behavioral disturbance: Secondary | ICD-10-CM | POA: Diagnosis not present

## 2018-12-30 DIAGNOSIS — L84 Corns and callosities: Secondary | ICD-10-CM | POA: Diagnosis not present

## 2018-12-30 DIAGNOSIS — E1151 Type 2 diabetes mellitus with diabetic peripheral angiopathy without gangrene: Secondary | ICD-10-CM | POA: Diagnosis not present

## 2019-02-09 DIAGNOSIS — F0390 Unspecified dementia without behavioral disturbance: Secondary | ICD-10-CM | POA: Diagnosis not present

## 2019-02-09 DIAGNOSIS — I639 Cerebral infarction, unspecified: Secondary | ICD-10-CM | POA: Diagnosis not present

## 2019-02-09 DIAGNOSIS — E639 Nutritional deficiency, unspecified: Secondary | ICD-10-CM | POA: Diagnosis not present

## 2019-02-09 DIAGNOSIS — I10 Essential (primary) hypertension: Secondary | ICD-10-CM | POA: Diagnosis not present

## 2019-02-09 DIAGNOSIS — R569 Unspecified convulsions: Secondary | ICD-10-CM | POA: Diagnosis not present

## 2019-02-09 DIAGNOSIS — J309 Allergic rhinitis, unspecified: Secondary | ICD-10-CM | POA: Diagnosis not present

## 2019-02-09 DIAGNOSIS — E1159 Type 2 diabetes mellitus with other circulatory complications: Secondary | ICD-10-CM | POA: Diagnosis not present

## 2019-02-09 DIAGNOSIS — D696 Thrombocytopenia, unspecified: Secondary | ICD-10-CM | POA: Diagnosis not present

## 2019-02-09 DIAGNOSIS — F339 Major depressive disorder, recurrent, unspecified: Secondary | ICD-10-CM | POA: Diagnosis not present

## 2019-02-09 DIAGNOSIS — E039 Hypothyroidism, unspecified: Secondary | ICD-10-CM | POA: Diagnosis not present

## 2019-02-09 DIAGNOSIS — G8929 Other chronic pain: Secondary | ICD-10-CM | POA: Diagnosis not present

## 2019-02-16 DIAGNOSIS — Z03818 Encounter for observation for suspected exposure to other biological agents ruled out: Secondary | ICD-10-CM | POA: Diagnosis not present

## 2019-02-18 DIAGNOSIS — F0150 Vascular dementia without behavioral disturbance: Secondary | ICD-10-CM | POA: Diagnosis not present

## 2019-02-18 DIAGNOSIS — F0390 Unspecified dementia without behavioral disturbance: Secondary | ICD-10-CM | POA: Diagnosis not present

## 2019-02-18 DIAGNOSIS — J309 Allergic rhinitis, unspecified: Secondary | ICD-10-CM | POA: Diagnosis not present

## 2019-02-18 DIAGNOSIS — D696 Thrombocytopenia, unspecified: Secondary | ICD-10-CM | POA: Diagnosis not present

## 2019-02-18 DIAGNOSIS — G8929 Other chronic pain: Secondary | ICD-10-CM | POA: Diagnosis not present

## 2019-02-18 DIAGNOSIS — I639 Cerebral infarction, unspecified: Secondary | ICD-10-CM | POA: Diagnosis not present

## 2019-02-18 DIAGNOSIS — I10 Essential (primary) hypertension: Secondary | ICD-10-CM | POA: Diagnosis not present

## 2019-02-18 DIAGNOSIS — R634 Abnormal weight loss: Secondary | ICD-10-CM | POA: Diagnosis not present

## 2019-02-18 DIAGNOSIS — F331 Major depressive disorder, recurrent, moderate: Secondary | ICD-10-CM | POA: Diagnosis not present

## 2019-02-18 DIAGNOSIS — E039 Hypothyroidism, unspecified: Secondary | ICD-10-CM | POA: Diagnosis not present

## 2019-02-18 DIAGNOSIS — E1159 Type 2 diabetes mellitus with other circulatory complications: Secondary | ICD-10-CM | POA: Diagnosis not present

## 2019-02-18 DIAGNOSIS — E639 Nutritional deficiency, unspecified: Secondary | ICD-10-CM | POA: Diagnosis not present

## 2019-03-09 DIAGNOSIS — G8929 Other chronic pain: Secondary | ICD-10-CM | POA: Diagnosis not present

## 2019-03-09 DIAGNOSIS — I10 Essential (primary) hypertension: Secondary | ICD-10-CM | POA: Diagnosis not present

## 2019-03-09 DIAGNOSIS — J309 Allergic rhinitis, unspecified: Secondary | ICD-10-CM | POA: Diagnosis not present

## 2019-03-17 DIAGNOSIS — E1151 Type 2 diabetes mellitus with diabetic peripheral angiopathy without gangrene: Secondary | ICD-10-CM | POA: Diagnosis not present

## 2019-03-17 DIAGNOSIS — L84 Corns and callosities: Secondary | ICD-10-CM | POA: Diagnosis not present

## 2019-03-21 DIAGNOSIS — F331 Major depressive disorder, recurrent, moderate: Secondary | ICD-10-CM | POA: Diagnosis not present

## 2019-03-21 DIAGNOSIS — F0150 Vascular dementia without behavioral disturbance: Secondary | ICD-10-CM | POA: Diagnosis not present

## 2019-04-04 DIAGNOSIS — F0150 Vascular dementia without behavioral disturbance: Secondary | ICD-10-CM | POA: Diagnosis not present

## 2019-04-04 DIAGNOSIS — F331 Major depressive disorder, recurrent, moderate: Secondary | ICD-10-CM | POA: Diagnosis not present

## 2019-04-06 DIAGNOSIS — S90411A Abrasion, right great toe, initial encounter: Secondary | ICD-10-CM | POA: Diagnosis not present

## 2019-04-06 DIAGNOSIS — I10 Essential (primary) hypertension: Secondary | ICD-10-CM | POA: Diagnosis not present

## 2019-04-06 DIAGNOSIS — E1159 Type 2 diabetes mellitus with other circulatory complications: Secondary | ICD-10-CM | POA: Diagnosis not present

## 2019-04-20 ENCOUNTER — Other Ambulatory Visit: Payer: Self-pay

## 2019-04-28 DIAGNOSIS — E039 Hypothyroidism, unspecified: Secondary | ICD-10-CM | POA: Diagnosis not present

## 2019-04-28 DIAGNOSIS — F0390 Unspecified dementia without behavioral disturbance: Secondary | ICD-10-CM | POA: Diagnosis not present

## 2019-04-28 DIAGNOSIS — E1159 Type 2 diabetes mellitus with other circulatory complications: Secondary | ICD-10-CM | POA: Diagnosis not present

## 2019-04-28 DIAGNOSIS — I639 Cerebral infarction, unspecified: Secondary | ICD-10-CM | POA: Diagnosis not present

## 2019-04-28 DIAGNOSIS — I10 Essential (primary) hypertension: Secondary | ICD-10-CM | POA: Diagnosis not present

## 2019-04-28 DIAGNOSIS — J309 Allergic rhinitis, unspecified: Secondary | ICD-10-CM | POA: Diagnosis not present

## 2019-04-28 DIAGNOSIS — G8929 Other chronic pain: Secondary | ICD-10-CM | POA: Diagnosis not present

## 2019-04-28 DIAGNOSIS — D696 Thrombocytopenia, unspecified: Secondary | ICD-10-CM | POA: Diagnosis not present

## 2019-04-30 DIAGNOSIS — F3341 Major depressive disorder, recurrent, in partial remission: Secondary | ICD-10-CM | POA: Diagnosis not present

## 2019-04-30 DIAGNOSIS — F0150 Vascular dementia without behavioral disturbance: Secondary | ICD-10-CM | POA: Diagnosis not present

## 2019-05-04 DIAGNOSIS — D509 Iron deficiency anemia, unspecified: Secondary | ICD-10-CM | POA: Diagnosis not present

## 2019-05-04 DIAGNOSIS — E039 Hypothyroidism, unspecified: Secondary | ICD-10-CM | POA: Diagnosis not present

## 2019-05-04 DIAGNOSIS — E119 Type 2 diabetes mellitus without complications: Secondary | ICD-10-CM | POA: Diagnosis not present

## 2019-05-11 DIAGNOSIS — F331 Major depressive disorder, recurrent, moderate: Secondary | ICD-10-CM | POA: Diagnosis not present

## 2019-05-11 DIAGNOSIS — F0150 Vascular dementia without behavioral disturbance: Secondary | ICD-10-CM | POA: Diagnosis not present

## 2019-05-25 DIAGNOSIS — L84 Corns and callosities: Secondary | ICD-10-CM | POA: Diagnosis not present

## 2019-05-25 DIAGNOSIS — E1151 Type 2 diabetes mellitus with diabetic peripheral angiopathy without gangrene: Secondary | ICD-10-CM | POA: Diagnosis not present

## 2019-05-26 DIAGNOSIS — E119 Type 2 diabetes mellitus without complications: Secondary | ICD-10-CM | POA: Diagnosis not present

## 2019-05-26 DIAGNOSIS — I6350 Cerebral infarction due to unspecified occlusion or stenosis of unspecified cerebral artery: Secondary | ICD-10-CM | POA: Diagnosis not present

## 2019-05-26 DIAGNOSIS — E039 Hypothyroidism, unspecified: Secondary | ICD-10-CM | POA: Diagnosis not present

## 2019-06-02 DIAGNOSIS — J309 Allergic rhinitis, unspecified: Secondary | ICD-10-CM | POA: Diagnosis not present

## 2019-06-02 DIAGNOSIS — E039 Hypothyroidism, unspecified: Secondary | ICD-10-CM | POA: Diagnosis not present

## 2019-06-02 DIAGNOSIS — D509 Iron deficiency anemia, unspecified: Secondary | ICD-10-CM | POA: Diagnosis not present

## 2019-06-06 DIAGNOSIS — N390 Urinary tract infection, site not specified: Secondary | ICD-10-CM | POA: Diagnosis not present

## 2019-06-06 DIAGNOSIS — N399 Disorder of urinary system, unspecified: Secondary | ICD-10-CM | POA: Diagnosis not present

## 2019-06-13 DIAGNOSIS — F0150 Vascular dementia without behavioral disturbance: Secondary | ICD-10-CM | POA: Diagnosis not present

## 2019-06-13 DIAGNOSIS — G894 Chronic pain syndrome: Secondary | ICD-10-CM | POA: Diagnosis not present

## 2019-06-13 DIAGNOSIS — I10 Essential (primary) hypertension: Secondary | ICD-10-CM | POA: Diagnosis not present

## 2019-06-13 DIAGNOSIS — F331 Major depressive disorder, recurrent, moderate: Secondary | ICD-10-CM | POA: Diagnosis not present

## 2019-06-28 DIAGNOSIS — F0150 Vascular dementia without behavioral disturbance: Secondary | ICD-10-CM | POA: Diagnosis not present

## 2019-06-28 DIAGNOSIS — F331 Major depressive disorder, recurrent, moderate: Secondary | ICD-10-CM | POA: Diagnosis not present

## 2019-06-29 DIAGNOSIS — Z03818 Encounter for observation for suspected exposure to other biological agents ruled out: Secondary | ICD-10-CM | POA: Diagnosis not present

## 2019-06-30 DIAGNOSIS — I10 Essential (primary) hypertension: Secondary | ICD-10-CM | POA: Diagnosis not present

## 2019-06-30 DIAGNOSIS — G894 Chronic pain syndrome: Secondary | ICD-10-CM | POA: Diagnosis not present

## 2019-06-30 DIAGNOSIS — E039 Hypothyroidism, unspecified: Secondary | ICD-10-CM | POA: Diagnosis not present

## 2019-07-02 DIAGNOSIS — Z23 Encounter for immunization: Secondary | ICD-10-CM | POA: Diagnosis not present

## 2019-07-03 DIAGNOSIS — M79602 Pain in left arm: Secondary | ICD-10-CM | POA: Diagnosis not present

## 2019-07-06 DIAGNOSIS — Z03818 Encounter for observation for suspected exposure to other biological agents ruled out: Secondary | ICD-10-CM | POA: Diagnosis not present

## 2019-07-11 ENCOUNTER — Emergency Department (HOSPITAL_COMMUNITY): Payer: Medicare Other

## 2019-07-11 ENCOUNTER — Encounter (HOSPITAL_COMMUNITY): Payer: Self-pay | Admitting: Emergency Medicine

## 2019-07-11 ENCOUNTER — Inpatient Hospital Stay (HOSPITAL_COMMUNITY)
Admission: EM | Admit: 2019-07-11 | Discharge: 2019-07-12 | DRG: 871 | Disposition: A | Discharge status: Skilled Nursing Facility | Payer: Medicare Other | Source: Skilled Nursing Facility | Attending: Internal Medicine | Admitting: Internal Medicine

## 2019-07-11 ENCOUNTER — Inpatient Hospital Stay (HOSPITAL_COMMUNITY): Payer: Medicare Other

## 2019-07-11 DIAGNOSIS — R0602 Shortness of breath: Secondary | ICD-10-CM | POA: Diagnosis not present

## 2019-07-11 DIAGNOSIS — Z9049 Acquired absence of other specified parts of digestive tract: Secondary | ICD-10-CM

## 2019-07-11 DIAGNOSIS — R410 Disorientation, unspecified: Secondary | ICD-10-CM | POA: Diagnosis not present

## 2019-07-11 DIAGNOSIS — Z66 Do not resuscitate: Secondary | ICD-10-CM | POA: Diagnosis present

## 2019-07-11 DIAGNOSIS — I5032 Chronic diastolic (congestive) heart failure: Secondary | ICD-10-CM | POA: Diagnosis present

## 2019-07-11 DIAGNOSIS — R296 Repeated falls: Secondary | ICD-10-CM | POA: Diagnosis present

## 2019-07-11 DIAGNOSIS — Z87440 Personal history of urinary (tract) infections: Secondary | ICD-10-CM | POA: Diagnosis not present

## 2019-07-11 DIAGNOSIS — J9811 Atelectasis: Secondary | ICD-10-CM | POA: Diagnosis present

## 2019-07-11 DIAGNOSIS — Z7982 Long term (current) use of aspirin: Secondary | ICD-10-CM

## 2019-07-11 DIAGNOSIS — R404 Transient alteration of awareness: Secondary | ICD-10-CM | POA: Diagnosis not present

## 2019-07-11 DIAGNOSIS — R6520 Severe sepsis without septic shock: Secondary | ICD-10-CM | POA: Diagnosis present

## 2019-07-11 DIAGNOSIS — E119 Type 2 diabetes mellitus without complications: Secondary | ICD-10-CM | POA: Diagnosis present

## 2019-07-11 DIAGNOSIS — R531 Weakness: Secondary | ICD-10-CM | POA: Diagnosis not present

## 2019-07-11 DIAGNOSIS — J9601 Acute respiratory failure with hypoxia: Secondary | ICD-10-CM | POA: Diagnosis present

## 2019-07-11 DIAGNOSIS — Z90710 Acquired absence of both cervix and uterus: Secondary | ICD-10-CM

## 2019-07-11 DIAGNOSIS — Z79890 Hormone replacement therapy (postmenopausal): Secondary | ICD-10-CM | POA: Diagnosis not present

## 2019-07-11 DIAGNOSIS — Z20828 Contact with and (suspected) exposure to other viral communicable diseases: Secondary | ICD-10-CM | POA: Diagnosis present

## 2019-07-11 DIAGNOSIS — F0390 Unspecified dementia without behavioral disturbance: Secondary | ICD-10-CM | POA: Diagnosis present

## 2019-07-11 DIAGNOSIS — Z8249 Family history of ischemic heart disease and other diseases of the circulatory system: Secondary | ICD-10-CM | POA: Diagnosis not present

## 2019-07-11 DIAGNOSIS — A419 Sepsis, unspecified organism: Principal | ICD-10-CM | POA: Diagnosis present

## 2019-07-11 DIAGNOSIS — I679 Cerebrovascular disease, unspecified: Secondary | ICD-10-CM | POA: Diagnosis present

## 2019-07-11 DIAGNOSIS — R4182 Altered mental status, unspecified: Secondary | ICD-10-CM | POA: Diagnosis not present

## 2019-07-11 DIAGNOSIS — Z8673 Personal history of transient ischemic attack (TIA), and cerebral infarction without residual deficits: Secondary | ICD-10-CM | POA: Diagnosis not present

## 2019-07-11 DIAGNOSIS — I110 Hypertensive heart disease with heart failure: Secondary | ICD-10-CM | POA: Diagnosis present

## 2019-07-11 DIAGNOSIS — F329 Major depressive disorder, single episode, unspecified: Secondary | ICD-10-CM | POA: Diagnosis present

## 2019-07-11 DIAGNOSIS — R0902 Hypoxemia: Secondary | ICD-10-CM | POA: Diagnosis not present

## 2019-07-11 DIAGNOSIS — Z79899 Other long term (current) drug therapy: Secondary | ICD-10-CM | POA: Diagnosis not present

## 2019-07-11 DIAGNOSIS — Z743 Need for continuous supervision: Secondary | ICD-10-CM | POA: Diagnosis not present

## 2019-07-11 DIAGNOSIS — E039 Hypothyroidism, unspecified: Secondary | ICD-10-CM | POA: Diagnosis present

## 2019-07-11 LAB — CBC WITH DIFFERENTIAL/PLATELET
Abs Immature Granulocytes: 0.06 10*3/uL (ref 0.00–0.07)
Basophils Absolute: 0 10*3/uL (ref 0.0–0.1)
Basophils Relative: 0 %
Eosinophils Absolute: 0 10*3/uL (ref 0.0–0.5)
Eosinophils Relative: 0 %
HCT: 39.3 % (ref 36.0–46.0)
Hemoglobin: 12.7 g/dL (ref 12.0–15.0)
Immature Granulocytes: 1 %
Lymphocytes Relative: 3 %
Lymphs Abs: 0.3 10*3/uL — ABNORMAL LOW (ref 0.7–4.0)
MCH: 31.6 pg (ref 26.0–34.0)
MCHC: 32.3 g/dL (ref 30.0–36.0)
MCV: 97.8 fL (ref 80.0–100.0)
Monocytes Absolute: 0.4 10*3/uL (ref 0.1–1.0)
Monocytes Relative: 4 %
Neutro Abs: 8.8 10*3/uL — ABNORMAL HIGH (ref 1.7–7.7)
Neutrophils Relative %: 92 %
Platelets: 69 10*3/uL — ABNORMAL LOW (ref 150–400)
RBC: 4.02 MIL/uL (ref 3.87–5.11)
RDW: 12.9 % (ref 11.5–15.5)
WBC: 9.6 10*3/uL (ref 4.0–10.5)
nRBC: 0 % (ref 0.0–0.2)

## 2019-07-11 LAB — COMPREHENSIVE METABOLIC PANEL
ALT: 31 U/L (ref 0–44)
AST: 39 U/L (ref 15–41)
Albumin: 3.5 g/dL (ref 3.5–5.0)
Alkaline Phosphatase: 139 U/L — ABNORMAL HIGH (ref 38–126)
Anion gap: 11 (ref 5–15)
BUN: 22 mg/dL (ref 8–23)
CO2: 22 mmol/L (ref 22–32)
Calcium: 9 mg/dL (ref 8.9–10.3)
Chloride: 105 mmol/L (ref 98–111)
Creatinine, Ser: 1.13 mg/dL — ABNORMAL HIGH (ref 0.44–1.00)
GFR calc Af Amer: 51 mL/min — ABNORMAL LOW (ref >60)
GFR calc non Af Amer: 44 mL/min — ABNORMAL LOW (ref >60)
Glucose, Bld: 232 mg/dL — ABNORMAL HIGH (ref 70–99)
Potassium: 4 mmol/L (ref 3.5–5.1)
Sodium: 138 mmol/L (ref 135–145)
Total Bilirubin: 2.2 mg/dL — ABNORMAL HIGH (ref 0.3–1.2)
Total Protein: 6.1 g/dL — ABNORMAL LOW (ref 6.5–8.1)

## 2019-07-11 LAB — TROPONIN I (HIGH SENSITIVITY)
Troponin I (High Sensitivity): 23 ng/L — ABNORMAL HIGH (ref <18)
Troponin I (High Sensitivity): 30 ng/L — ABNORMAL HIGH (ref <18)

## 2019-07-11 LAB — URINALYSIS, ROUTINE W REFLEX MICROSCOPIC
Bilirubin Urine: NEGATIVE
Glucose, UA: NEGATIVE mg/dL
Hgb urine dipstick: NEGATIVE
Ketones, ur: NEGATIVE mg/dL
Nitrite: NEGATIVE
Protein, ur: 30 mg/dL — AB
Specific Gravity, Urine: 1.027 (ref 1.005–1.030)
pH: 5 (ref 5.0–8.0)

## 2019-07-11 LAB — PROCALCITONIN: Procalcitonin: 0.16 ng/mL

## 2019-07-11 LAB — PROTIME-INR
INR: 1.6 — ABNORMAL HIGH (ref 0.8–1.2)
Prothrombin Time: 19 seconds — ABNORMAL HIGH (ref 11.4–15.2)

## 2019-07-11 LAB — LACTIC ACID, PLASMA
Lactic Acid, Venous: 3.5 mmol/L (ref 0.5–1.9)
Lactic Acid, Venous: 3.2 mmol/L (ref 0.5–1.9)

## 2019-07-11 LAB — BRAIN NATRIURETIC PEPTIDE: B Natriuretic Peptide: 85 pg/mL (ref 0.0–100.0)

## 2019-07-11 LAB — CBG MONITORING, ED: Glucose-Capillary: 224 mg/dL — ABNORMAL HIGH (ref 70–99)

## 2019-07-11 LAB — APTT: aPTT: 36 seconds (ref 24–36)

## 2019-07-11 MED ORDER — VANCOMYCIN HCL IN DEXTROSE 1-5 GM/200ML-% IV SOLN: 1000.0000 mg | Freq: Once | INTRAVENOUS | Status: DC

## 2019-07-11 MED ORDER — VANCOMYCIN HCL 1.25 G IV SOLR
1250.0000 mg | INTRAVENOUS | Status: DC
  Filled 2019-07-11: qty 1250

## 2019-07-11 MED ORDER — ACETAMINOPHEN 325 MG PO TABS: 650.0000 mg | ORAL_TABLET | Freq: Four times a day (QID) | ORAL | Status: DC | PRN

## 2019-07-11 MED ORDER — SODIUM CHLORIDE 0.9 % IV BOLUS
500.0000 mL | Freq: Once | INTRAVENOUS | Status: AC
  Administered 2019-07-11: 500 mL via INTRAVENOUS

## 2019-07-11 MED ORDER — SODIUM CHLORIDE 0.9 % IV SOLN
INTRAVENOUS | Status: DC
  Administered 2019-07-11: 21:00:00 via INTRAVENOUS

## 2019-07-11 MED ORDER — SODIUM CHLORIDE 0.9 % IV BOLUS: 500.0000 mL | Freq: Once | INTRAVENOUS | Status: DC

## 2019-07-11 MED ORDER — ONDANSETRON HCL 4 MG/2ML IJ SOLN: 4.0000 mg | Freq: Four times a day (QID) | INTRAMUSCULAR | Status: DC | PRN

## 2019-07-11 MED ORDER — ONDANSETRON HCL 4 MG/2ML IJ SOLN
4.0000 mg | Freq: Once | INTRAMUSCULAR | Status: AC
  Administered 2019-07-11: 4 mg via INTRAVENOUS
  Filled 2019-07-11: qty 2

## 2019-07-11 MED ORDER — METRONIDAZOLE IN NACL 5-0.79 MG/ML-% IV SOLN
500.0000 mg | Freq: Once | INTRAVENOUS | Status: AC
  Administered 2019-07-11: 500 mg via INTRAVENOUS
  Filled 2019-07-11: qty 100

## 2019-07-11 MED ORDER — SODIUM CHLORIDE 0.9 % IV SOLN
2.0000 g | Freq: Once | INTRAVENOUS | Status: AC
  Administered 2019-07-11: 2 g via INTRAVENOUS
  Filled 2019-07-11: qty 2

## 2019-07-11 MED ORDER — LEVOTHYROXINE SODIUM 25 MCG PO TABS
25.0000 ug | ORAL_TABLET | Freq: Every day | ORAL | Status: DC
  Administered 2019-07-12: 25 ug via ORAL
  Filled 2019-07-11: qty 1

## 2019-07-11 MED ORDER — VANCOMYCIN HCL 10 G IV SOLR
1500.0000 mg | Freq: Once | INTRAVENOUS | Status: AC
  Administered 2019-07-11: 1500 mg via INTRAVENOUS
  Filled 2019-07-11: qty 1500

## 2019-07-11 MED ORDER — LORATADINE 10 MG PO TABS
10.0000 mg | ORAL_TABLET | Freq: Every day | ORAL | Status: DC
  Administered 2019-07-12: 10 mg via ORAL
  Filled 2019-07-11: qty 1

## 2019-07-11 MED ORDER — ONDANSETRON HCL 4 MG PO TABS: 4.0000 mg | ORAL_TABLET | Freq: Four times a day (QID) | ORAL | Status: DC | PRN

## 2019-07-11 MED ORDER — ACETAMINOPHEN 500 MG PO TABS: 500.0000 mg | ORAL_TABLET | Freq: Three times a day (TID) | ORAL | Status: DC | PRN

## 2019-07-11 MED ORDER — SERTRALINE HCL 50 MG PO TABS
25.0000 mg | ORAL_TABLET | Freq: Every morning | ORAL | Status: DC
  Administered 2019-07-12: 25 mg via ORAL
  Filled 2019-07-11: qty 1

## 2019-07-11 MED ORDER — IOHEXOL 350 MG/ML SOLN
100.0000 mL | Freq: Once | INTRAVENOUS | Status: AC | PRN
  Administered 2019-07-11: 100 mL via INTRAVENOUS

## 2019-07-11 MED ORDER — SODIUM CHLORIDE 0.9 % IV BOLUS
500.0000 mL | Freq: Once | INTRAVENOUS | Status: AC
  Administered 2019-07-11: 16:00:00 500 mL via INTRAVENOUS

## 2019-07-11 MED ORDER — DONEPEZIL HCL 5 MG PO TABS
5.0000 mg | ORAL_TABLET | Freq: Every day | ORAL | Status: DC
  Filled 2019-07-11 (×3): qty 1

## 2019-07-11 MED ORDER — SODIUM CHLORIDE 0.9 % IV SOLN
2.0000 g | Freq: Two times a day (BID) | INTRAVENOUS | Status: DC
  Administered 2019-07-12: 2 g via INTRAVENOUS
  Filled 2019-07-11: qty 2

## 2019-07-11 MED ORDER — ENOXAPARIN SODIUM 40 MG/0.4ML ~~LOC~~ SOLN: 40.0000 mg | SUBCUTANEOUS | Status: DC

## 2019-07-11 MED ORDER — ASPIRIN EC 81 MG PO TBEC
81.0000 mg | DELAYED_RELEASE_TABLET | Freq: Every morning | ORAL | Status: DC
  Administered 2019-07-12: 81 mg via ORAL
  Filled 2019-07-11: qty 1

## 2019-07-11 MED ORDER — ACETAMINOPHEN 650 MG RE SUPP: 650.0000 mg | Freq: Four times a day (QID) | RECTAL | Status: DC | PRN

## 2019-07-11 NOTE — ED Notes (Signed)
Date and time results received: 07/11/19 2:44 PM  (use smartphrase ".now" to insert current time)  Test: Lactic Critical Value: 3.5  Name of Provider Notified: Joana Reamer PA  Orders Received? Or Actions Taken?: Orders Received - See Orders for details

## 2019-07-11 NOTE — ED Notes (Signed)
Pt's O2 sat dropping into the 80s. Refuses to wear oxygen. Pulls off face and yells "no". Will continue to monitor.

## 2019-07-11 NOTE — Progress Notes (Signed)
Notified bedside nurse of need to administer fluid bolus, needs 2517 cc.

## 2019-07-11 NOTE — H&P (Addendum)
TRH H&P    Patient Demographics:    Jennifer Kerr, is a 85 y.o. female  MRN: [id number]  DOB - [birth year]  Admit Date - 07/11/2019  Referring MD/NP/PA: Dr. Hyacinth Meeker  Outpatient Primary MD for the patient is Benita Stabile, MD  Patient coming from: Skilled nursing facility  Chief complaint-shortness of breath   HPI:    Jennifer Kerr  is a 85 y.o. female, with history of CVA, type diabetes mellitus, hypertension, dementia was sent from the skilled facility for chief complaints of weakness and hypoxia.  Patient is a poor historian so history obtained from the ED notes.  EMS was called out at the facility, as per nursing facility patient's O2 sats was 70% range.  However when EMS arrived O2 sats were in the 90s without supplemental oxygen.  Patient denied chest pain or shortness of breath. As per patient's daughter [name] she was told by facility that patient has been more weak over the past 3 days.  Requires significant assistance with ambulation.  She had decreased oral intake.  She had an episode of emesis this morning which prompted them to check vital signs and she was found to be hypoxic.  Patient did not have any fever.  She was recently treated for UTI with Bactrim.  Patient is DNR as per the ED provider notes.   Review of systems:    In addition to the HPI above,  No Fever-chills, No Headache, No changes with Vision or hearing, No problems swallowing food or Liquids, No Chest pain, Cough or Shortness of Breath, No Abdominal pain, No Nausea or Vomiting, bowel movements are regular, No Blood in stool or Urine, No dysuria, No new skin rashes or bruises, No new joints pains-aches,  No new weakness, tingling, numbness in any extremity, No recent weight gain or loss, No polyuria, polydypsia or polyphagia, No significant Mental Stressors.  All other systems reviewed and are negative.    Past History of the  following :    Past Medical History:  Diagnosis Date   Cerebrovascular disease    Dementia (HCC)    Depression    DM type 2 (diabetes mellitus, type 2) (HCC) 07/02/2011   Elevated LFTs    Frequent falls    HTN (hypertension) 07/02/2011   Hypertension    Thrombocytopenia (HCC)    UTI (lower urinary tract infection)    UTI (urinary tract infection)       Past Surgical History:  Procedure Laterality Date   ABDOMINAL HYSTERECTOMY     CHOLECYSTECTOMY        Social History:      Social History   Tobacco Use   Smoking status: Never Smoker   Smokeless tobacco: Never Used  Substance Use Topics   Alcohol use: No       Family History :     Family History  Problem Relation Age of Onset   Cancer Mother    Heart disease Father       Home Medications:   Prior to Admission medications   Medication Sig  Start Date End Date Taking? Authorizing Provider  acetaminophen (TYLENOL) 500 MG tablet Take 500-1,000 mg by mouth every 8 (eight) hours as needed for mild pain or moderate pain.    Yes [provider]  aspirin EC 81 MG tablet Take 81 mg by mouth every morning.    Yes [provider]  cetirizine (ZYRTEC) 10 MG tablet Take 10 mg by mouth at bedtime.   Yes [provider]  donepezil (ARICEPT) 5 MG tablet Take 5 mg by mouth at bedtime.   Yes [provider]  levothyroxine (SYNTHROID, LEVOTHROID) 25 MCG tablet Take 25 mcg by mouth daily before breakfast.   Yes [provider]  lisinopril (ZESTRIL) 10 MG tablet Take 10 mg by mouth every morning.    Yes [provider]  Multiple Vitamin (MULTIVITAMIN WITH MINERALS) TABS tablet Take 1 tablet by mouth 2 (two) times daily.   Yes [provider]  sertraline (ZOLOFT) 25 MG tablet Take 25 mg by mouth every morning.   Yes [provider]     Allergies:    No Known Allergies   Physical Exam:   Vitals  Blood pressure (!) 98/57, pulse 76,  temperature 98 F (36.7 C), temperature source Oral, resp. rate 11, height 5\' 8"  (1.727 m), weight 83.9 kg, SpO2 100 %.  1.  General: Appears lethargic  2. Psychiatric: Alert, oriented x2, lacks judgment and insight  3. Neurologic: Cranial nerves II through XII grossly intact, moving all extremities  4. HEENMT:  Atraumatic normocephalic, extraocular muscle intact  5. Respiratory : Clear to auscultation bilaterally  6. Cardiovascular : S1-S2, regular, no murmur auscultated  7. Gastrointestinal:  Abdominal soft, nontender, no organomegaly      Data Review:    CBC Recent Labs  Lab 07/11/19 1347  WBC 9.6  HGB 12.7  HCT 39.3  PLT 69*  MCV 97.8  MCH 31.6  MCHC 32.3  RDW 12.9  LYMPHSABS 0.3*  MONOABS 0.4  EOSABS 0.0  BASOSABS 0.0   ------------------------------------------------------------------------------------------------------------------  Results for orders placed or performed during the hospital encounter of 07/11/19 (from the past 48 hour(s))  Urinalysis, Routine w reflex microscopic     Status: Abnormal   Collection Time: 07/11/19  1:03 PM  Result Value Ref Range   Color, Urine AMBER (A) YELLOW    Comment: BIOCHEMICALS MAY BE AFFECTED BY COLOR   APPearance CLOUDY (A) CLEAR   Specific Gravity, Urine 1.027 1.005 - 1.030   pH 5.0 5.0 - 8.0   Glucose, UA NEGATIVE NEGATIVE mg/dL   Hgb urine dipstick NEGATIVE NEGATIVE   Bilirubin Urine NEGATIVE NEGATIVE   Ketones, ur NEGATIVE NEGATIVE mg/dL   Protein, ur 30 (A) NEGATIVE mg/dL   Nitrite NEGATIVE NEGATIVE   Leukocytes,Ua SMALL (A) NEGATIVE   RBC / HPF 0-5 0 - 5 RBC/hpf   WBC, UA 6-10 0 - 5 WBC/hpf   Bacteria, UA RARE (A) NONE SEEN   Squamous Epithelial / LPF 6-10 0 - 5   Mucus PRESENT    Hyaline Casts, UA PRESENT     Comment: Performed at [facility], [address], [place], [place] [address]  POC CBG, ED     Status: Abnormal   Collection Time: 07/11/19  1:35 PM  Result Value Ref Range    Glucose-Capillary 224 (H) 70 - 99 mg/dL  Lactic acid, plasma     Status: Abnormal   Collection Time: 07/11/19  1:47 PM  Result Value Ref Range   Lactic Acid, Venous 3.5 (  HH) 0.5 - 1.9 mmol/L    Comment: CRITICAL RESULT CALLED TO, READ BACK BY AND VERIFIED WITH: CRUISE,M AT 1443 ON 10.19.20 BY ISLEY,B Performed at [facility], [address], [place], [place] [address]   Comprehensive metabolic panel     Status: Abnormal   Collection Time: 07/11/19  1:47 PM  Result Value Ref Range   Sodium 138 135 - 145 mmol/L   Potassium 4.0 3.5 - 5.1 mmol/L   Chloride 105 98 - 111 mmol/L   CO2 22 22 - 32 mmol/L   Glucose, Bld 232 (H) 70 - 99 mg/dL   BUN 22 8 - 23 mg/dL   Creatinine, Ser 8.46 (H) 0.44 - 1.00 mg/dL   Calcium 9.0 8.9 - 65.9 mg/dL   Total Protein 6.1 (L) 6.5 - 8.1 g/dL   Albumin 3.5 3.5 - 5.0 g/dL   AST 39 15 - 41 U/L   ALT 31 0 - 44 U/L   Alkaline Phosphatase 139 (H) 38 - 126 U/L   Total Bilirubin 2.2 (H) 0.3 - 1.2 mg/dL   GFR calc non Af Amer 44 (L) >60 mL/min   GFR calc Af Amer 51 (L) >60 mL/min   Anion gap 11 5 - 15    Comment: Performed at [facility], [address], [place], [place] [address]  CBC WITH DIFFERENTIAL     Status: Abnormal   Collection Time: 07/11/19  1:47 PM  Result Value Ref Range   WBC 9.6 4.0 - 10.5 K/uL   RBC 4.02 3.87 - 5.11 MIL/uL   Hemoglobin 12.7 12.0 - 15.0 g/dL   HCT 17.7 93.9 - 03.0 %   MCV 97.8 80.0 - 100.0 fL   MCH 31.6 26.0 - 34.0 pg   MCHC 32.3 30.0 - 36.0 g/dL   RDW 09.2 33.0 - 07.6 %   Platelets 69 (L) 150 - 400 K/uL    Comment: PLATELET COUNT CONFIRMED BY SMEAR SPECIMEN CHECKED FOR CLOTS Immature Platelet Fraction may be clinically indicated, consider ordering this additional test [id number]    nRBC 0.0 0.0 - 0.2 %   Neutrophils Relative % 92 %   Neutro Abs 8.8 (H) 1.7 - 7.7 K/uL   Lymphocytes Relative 3 %   Lymphs Abs 0.3 (L) 0.7 - 4.0 K/uL   Monocytes Relative 4 %   Monocytes Absolute 0.4 0.1 - 1.0 K/uL   Eosinophils Relative  0 %   Eosinophils Absolute 0.0 0.0 - 0.5 K/uL   Basophils Relative 0 %   Basophils Absolute 0.0 0.0 - 0.1 K/uL   Immature Granulocytes 1 %   Abs Immature Granulocytes 0.06 0.00 - 0.07 K/uL    Comment: Performed at [facility], [address], [place], [place] [address]  Troponin I (High Sensitivity)     Status: Abnormal   Collection Time: 07/11/19  1:47 PM  Result Value Ref Range   Troponin I (High Sensitivity) 23 (H) <18 ng/L    Comment: (NOTE) Elevated high sensitivity troponin I (hsTnI) values and significant  changes across serial measurements may suggest ACS but many other  chronic and acute conditions are known to elevate hsTnI results.  Refer to the "Links" section for chest pain algorithms and additional  guidance. Performed at [facility], [address], [place], [place] [address]   Lactic acid, plasma     Status: Abnormal   Collection Time: 07/11/19  3:28 PM  Result Value Ref Range   Lactic Acid, Venous 3.2 (HH) 0.5 - 1.9 mmol/L  Comment: CRITICAL VALUE NOTED.  VALUE IS CONSISTENT WITH PREVIOUSLY REPORTED AND CALLED VALUE. Performed at [facility], [address] [place], [place] [address]   Troponin I (High Sensitivity)     Status: Abnormal   Collection Time: 07/11/19  3:28 PM  Result Value Ref Range   Troponin I (High Sensitivity) 30 (H) <18 ng/L    Comment: (NOTE) Elevated high sensitivity troponin I (hsTnI) values and significant  changes across serial measurements may suggest ACS but many other  chronic and acute conditions are known to elevate hsTnI results.  Refer to the "Links" section for chest pain algorithms and additional  guidance. Performed at [facility], [address] [place], [place] [address]   Blood Culture (routine x 2)     Status: None (Preliminary result)   Collection Time: 07/11/19  4:22 PM   Specimen: BLOOD LEFT WRIST  Result Value Ref Range   Specimen Description BLOOD LEFT WRIST    Special Requests      BOTTLES DRAWN AEROBIC AND  ANAEROBIC Blood Culture adequate volume Performed at [facility], [address] [place], [place] [address]    Culture PENDING    Report Status PENDING   Blood Culture (routine x 2)     Status: None (Preliminary result)   Collection Time: 07/11/19  4:22 PM   Specimen: Right Antecubital; Blood  Result Value Ref Range   Specimen Description RIGHT ANTECUBITAL    Special Requests      BOTTLES DRAWN AEROBIC AND ANAEROBIC Blood Culture adequate volume Performed at [facility], [address] [place], [place] [address]    Culture PENDING    Report Status PENDING   APTT     Status: None   Collection Time: 07/11/19  4:23 PM  Result Value Ref Range   aPTT 36 24 - 36 seconds    Comment: Performed at [facility], [address] [place], [place] [address]  Protime-INR     Status: Abnormal   Collection Time: 07/11/19  4:23 PM  Result Value Ref Range   Prothrombin Time 19.0 (H) 11.4 - 15.2 seconds   INR 1.6 (H) 0.8 - 1.2    Comment: (NOTE) INR goal varies based on device and disease states. Performed at [facility], [address] [place], [place] [address]     Chemistries  Recent Labs  Lab 07/11/19 1347  NA 138  K 4.0  CL 105  CO2 22  GLUCOSE 232*  BUN 22  CREATININE 1.13*  CALCIUM 9.0  AST 39  ALT 31  ALKPHOS 139*  BILITOT 2.2*   ------------------------------------------------------------------------------------------------------------------  ------------------------------------------------------------------------------------------------------------------ GFR: Estimated Creatinine Clearance: 41.3 mL/min (A) (by C-G formula based on SCr of 1.13 mg/dL (H)). Liver Function Tests: Recent Labs  Lab 07/11/19 1347  AST 39  ALT 31  ALKPHOS 139*  BILITOT 2.2*  PROT 6.1*  ALBUMIN 3.5   No results for input(s): LIPASE, AMYLASE in the last 168 hours. No results for input(s): AMMONIA in the last 168 hours. Coagulation Profile: Recent Labs  Lab 07/11/19 1623  INR 1.6*    CBG: Recent Labs  Lab 07/11/19 1335  GLUCAP 224*    --------------------------------------------------------------------------------------------------------------- Urine analysis:    Component Value Date/Time   COLORURINE AMBER (A) 07/11/2019 1303   APPEARANCEUR CLOUDY (A) 07/11/2019 1303   LABSPEC 1.027 07/11/2019 1303   PHURINE 5.0 07/11/2019 1303   GLUCOSEU NEGATIVE 07/11/2019 1303   HGBUR NEGATIVE 07/11/2019 1303   BILIRUBINUR NEGATIVE 07/11/2019 1303   KETONESUR NEGATIVE 07/11/2019 1303  PROTEINUR 30 (A) 07/11/2019 1303   UROBILINOGEN 1.0 02/05/2015 1338   NITRITE NEGATIVE 07/11/2019 1303   LEUKOCYTESUR SMALL (A) 07/11/2019 1303      Imaging Results:    Ct Head Wo Contrast  Result Date: 07/11/2019 CLINICAL DATA:  Altered mental status. Unexplained hypoxia. EXAM: CT HEAD WITHOUT CONTRAST TECHNIQUE: Contiguous axial images were obtained from the base of the skull through the vertex without intravenous contrast. COMPARISON:  10/02/2015. FINDINGS: Brain: Stable moderately enlarged ventricles and subarachnoid spaces. Stable mild patchy white matter low density in both cerebral hemispheres. No intracranial hemorrhage, mass lesion or CT evidence of acute infarction. Vascular: No hyperdense vessel or unexpected calcification. Skull: Normal. Negative for fracture or focal lesion. Sinuses/Orbits: No acute finding. Other: Deviation of the midportion of the nasal septum to the right and mild left concha bullosa. IMPRESSION: 1. No acute abnormality. 2. Stable moderate diffuse cerebral and cerebellar atrophy and mild chronic small vessel white matter ischemic changes in both cerebral hemispheres. Electronically Signed   By: Beckie Salts M.D.   On: 07/11/2019 18:25   Dg Chest Port 1 View  Result Date: 07/11/2019 CLINICAL DATA:  Weakness. Hypoxia. EXAM: PORTABLE CHEST 1 VIEW COMPARISON:  02/05/2015 FINDINGS: The heart size and pulmonary vascularity are normal the lungs are clear. No  effusions. No appreciable bone abnormality. IMPRESSION: No active disease. Electronically Signed   By: Francene Boyers M.D.   On: 07/11/2019 13:18    My personal review of EKG: Rhythm NSR, left bundle branch block   Assessment & Plan:    Active Problems:   Acute respiratory failure with hypoxia (HCC)   1. Acute hypoxic respiratory failure-unclear etiology, likely aspiration pneumonia as per history.  Patient had episode of vomiting and after that she was found to be hypoxic.  We will keep her n.p.o. for now.  Lactate is 3.2.  Chest x-ray shows no pneumonia.  I will obtain CTA chest to rule out pulmonary embolism.  Patient does have history of diastolic heart failure.  We will also check procalcitonin, BNP.  2. Vomiting, questionable dysphagia-we will obtain swallow evaluation in a.m.  Keep her n.p.o.  3. Sepsis-patient was hypotensive and hypoxic on presentation.  Had one episode of tachypnea, breathing back to baseline.  Lactic acid is 3.2, procalcitonin has been ordered as above.  Continue with vancomycin, cefepime per pharmacy consultation for possible aspiration pneumonia.  4. Dementia-continue Aricept.  5. Hypothyroidism-continue Synthroid.  6. Hypertension-blood pressure is soft, will hold Zestril at this time.  7. History of CVA-continue aspirin    DVT Prophylaxis-   Lovenox   AM Labs Ordered, also please review Full Orders  Family Communication: Admission, patients condition and plan of care including tests being ordered have been discussed with the patient  who indicate understanding and agree with the plan and Code Status.  Code Status:  Full code  Admission status: Inpatient: Based on patients clinical presentation and evaluation of above clinical data, I have made determination that patient meets Inpatient criteria at this time.  Time spent in minutes : 60 min   Meredeth Ide M.D on 07/11/2019 at 8:05 PM

## 2019-07-11 NOTE — Progress Notes (Signed)
Pharmacy Antibiotic Note  Jennifer Kerr is a 85 y.o. female admitted on 07/11/2019 with infection- unknown source.  Pharmacy has been consulted for Vancomycin and Cefepime dosing.  Plan: Vancomycin 1500 mg IV x 1 dose. Vancomycin 1250 mg IV every 24 hours.  Goal trough 15-20 mcg/mL.  Cefepime 2000 mg IV every 12 hours. Monitor labs, c/s, and vanco level as indicated.  Height: 5\' 8"  (172.7 cm) Weight: 185 lb (83.9 kg) IBW/kg (Calculated) : 63.9  Temp (24hrs), Avg:98 F (36.7 C), Min:98 F (36.7 C), Max:98 F (36.7 C)  Recent Labs  Lab 07/11/19 1347 07/11/19 1528  WBC 9.6  --   CREATININE 1.13*  --   LATICACIDVEN 3.5* 3.2*    Estimated Creatinine Clearance: 41.3 mL/min (A) (by C-G formula based on SCr of 1.13 mg/dL (H)).    No Known Allergies  Antimicrobials this admission: Vanco 10/19 >>  Cefepime 10/19 >>  Flagyl 10/19 >>  Dose adjustments this admission: Vanco/Cefepime  Microbiology results: 10/19 BCx: pending 10/19 UCx: pending    Thank you for allowing pharmacy to be a part of this patient's care.  Ramond Craver 07/11/2019 4:59 PM

## 2019-07-11 NOTE — ED Triage Notes (Signed)
Pt sent from [facility] for evaluation of o2 sat in 70s. EMS reports sats in 90s. Pt in no distress and has no complaints but wanted her checked out.

## 2019-07-11 NOTE — ED Provider Notes (Signed)
[facility] EMERGENCY DEPARTMENT Provider Note   CSN: [id number] Arrival date & time: 07/11/19  1210     History   Chief Complaint Chief Complaint  Patient presents with   Weakness   Level 5 caveat due to dementia HPI Jennifer Kerr is a 85 y.o. female with history of CVA, type 2 diabetes mellitus, hypertension, and dementia presents sent from facility for evaluation of weakness and possible hypoxia.  EMS was called out as the facility had checked her vital signs and noted she had O2 saturations in the 70% range.  On their arrival they report that her O2 sats were in the 90s without supplemental oxygen.  The patient tells me "I do not feel well "but cannot elaborate.  She denies chest pain, shortness of breath, does indicate some abdominal pain and gestures to the suprapubic region but denies any urinary symptoms.  She is oriented to person and place, can tell me that the year is "20 something" and knows that Jennifer Kerr is the president of the [place].   I did speak to the patient's daughter Jennifer Kerr who was at the bedside during portions of her assessment.  Jennifer Kerr states that she was told by the facility that the patient has been more weak over the last 3 days, has required significant assistance with ambulation, and has had decreased oral intake.  She had an episode of emesis this morning which prompted them to check her vital signs which was when she was noted to be hypoxic.  Daughter notes that she has not seen the patient in person since the start of the COVID-19 pandemic safer short visit on the porch of the facility in July.  Daughter notes that the patient appears very weak to her and has lost a lot of weight.  She also feels the patient has been slurring her speech for the last 3 weeks or so.  She notes she was recently treated for a UTI with Bactrim earlier this month.  She is DNR.  Daughter is power of attorney.    The history is provided by the patient, the EMS personnel and  medical records. The history is limited by the condition of the patient.    Past Medical History:  Diagnosis Date   Cerebrovascular disease    Dementia ([place])    Depression    DM type 2 (diabetes mellitus, type 2) ([place]) 07/02/2011   Elevated LFTs    Frequent falls    HTN (hypertension) 07/02/2011   Hypertension    Thrombocytopenia ([place])    UTI (lower urinary tract infection)    UTI (urinary tract infection)     Patient Active Problem List   Diagnosis Date Noted   Fracture of rib of right side 03/55/9741   Diastolic dysfunction 63/84/5364   Fall against object 07/02/2011   UTI (lower urinary tract infection) 07/02/2011   HTN (hypertension) 07/02/2011   DM type 2 (diabetes mellitus, type 2) ([place]) 07/02/2011   Dementia ([place]) 07/02/2011   Back pain, acute 07/02/2011   Thrombocytopenia ([place]) 07/02/2011   Elevated LFTs 07/02/2011   HYPOTHYROIDISM 08/27/2006   ANXIETY 08/27/2006   DEPRESSION 08/27/2006   IBS 08/27/2006   DIARRHEA 08/27/2006   HYPERGLYCEMIA 08/27/2006    Past Surgical History:  Procedure Laterality Date   ABDOMINAL HYSTERECTOMY     CHOLECYSTECTOMY       OB History    Gravida  0   Para  0   Term  0   Preterm  0  AB  0   Living        SAB  0   TAB  0   Ectopic  0   Multiple      Live Births               Home Medications    Prior to Admission medications   Medication Sig Start Date End Date Taking? Authorizing Provider  acetaminophen (TYLENOL) 500 MG tablet Take 500-1,000 mg by mouth every 8 (eight) hours as needed for mild pain or moderate pain.    Yes [provider]  aspirin EC 81 MG tablet Take 81 mg by mouth every morning.    Yes [provider]  cetirizine (ZYRTEC) 10 MG tablet Take 10 mg by mouth at bedtime.   Yes [provider]  donepezil (ARICEPT) 5 MG tablet Take 5 mg by mouth at bedtime.   Yes [provider]  levothyroxine (SYNTHROID, LEVOTHROID) 25  MCG tablet Take 25 mcg by mouth daily before breakfast.   Yes [provider]  lisinopril (ZESTRIL) 10 MG tablet Take 10 mg by mouth every morning.    Yes [provider]  Multiple Vitamin (MULTIVITAMIN WITH MINERALS) TABS tablet Take 1 tablet by mouth 2 (two) times daily.   Yes [provider]  sertraline (ZOLOFT) 25 MG tablet Take 25 mg by mouth every morning.   Yes [provider]    Family History Family History  Problem Relation Age of Onset   Cancer Mother    Heart disease Father     Social History Social History   Tobacco Use   Smoking status: Never Smoker   Smokeless tobacco: Never Used  Substance Use Topics   Alcohol use: No   Drug use: No     Allergies   Patient has no known allergies.   Review of Systems Review of Systems  Unable to perform ROS: Dementia     Physical Exam Updated Vital Signs BP (!) 98/57    Pulse 76    Temp 98 F (36.7 C) (Oral)    Resp 11    Ht 5\' 8"  (1.727 m)    Wt 83.9 kg    SpO2 100%    BMI 28.13 kg/m   Physical Exam Vitals signs and nursing note reviewed.  Constitutional:      General: She is not in acute distress.    Appearance: She is well-developed.  HENT:     Head: Normocephalic and atraumatic.     Mouth/Throat:     Mouth: Mucous membranes are dry.  Eyes:     General:        Right eye: No discharge.        Left eye: No discharge.     Conjunctiva/sclera: Conjunctivae normal.     Comments: Pupils miotic bilaterally  Neck:     Vascular: No JVD.     Trachea: No tracheal deviation.  Cardiovascular:     Rate and Rhythm: Normal rate and regular rhythm.     Pulses: Normal pulses.     Heart sounds: Normal heart sounds.  Pulmonary:     Effort: Pulmonary effort is normal.     Breath sounds: Normal breath sounds.     Comments: SpO2 saturations 92% on room air Abdominal:     General: Abdomen is flat. Bowel sounds are normal. There is no distension.     Palpations: Abdomen is soft.      Tenderness: There is abdominal tenderness in the suprapubic  area. There is no guarding or rebound.  Skin:    General: Skin is warm and dry.     Findings: No erythema.  Neurological:     Mental Status: She is alert.     Comments: Oriented to person, city, and decade.  Speech fluent without evidence of aphasia or dysarthria.  Follows commands without difficulty. No facial droop.  Cranial nerves II through XII tested and intact.  Moves extremities spontaneously without difficulty.  Sensation intact to light touch of bilateral upper and lower extremities.   Psychiatric:        Behavior: Behavior normal.      ED Treatments / Results  Labs (all labs ordered are listed, but only abnormal results are displayed) Labs Reviewed  LACTIC ACID, PLASMA - Abnormal; Notable for the following components:      Result Value   Lactic Acid, Venous 3.5 (*)    All other components within normal limits  LACTIC ACID, PLASMA - Abnormal; Notable for the following components:   Lactic Acid, Venous 3.2 (*)    All other components within normal limits  COMPREHENSIVE METABOLIC PANEL - Abnormal; Notable for the following components:   Glucose, Bld 232 (*)    Creatinine, Ser 1.13 (*)    Total Protein 6.1 (*)    Alkaline Phosphatase 139 (*)    Total Bilirubin 2.2 (*)    GFR calc non Af Amer 44 (*)    GFR calc Af Amer 51 (*)    All other components within normal limits  CBC WITH DIFFERENTIAL/PLATELET - Abnormal; Notable for the following components:   Platelets 69 (*)    Neutro Abs 8.8 (*)    Lymphs Abs 0.3 (*)    All other components within normal limits  URINALYSIS, ROUTINE W REFLEX MICROSCOPIC - Abnormal; Notable for the following components:   Color, Urine AMBER (*)    APPearance CLOUDY (*)    Protein, ur 30 (*)    Leukocytes,Ua SMALL (*)    Bacteria, UA RARE (*)    All other components within normal limits  PROTIME-INR - Abnormal; Notable for the following components:   Prothrombin Time 19.0 (*)      INR 1.6 (*)    All other components within normal limits  CBG MONITORING, ED - Abnormal; Notable for the following components:   Glucose-Capillary 224 (*)    All other components within normal limits  TROPONIN I (HIGH SENSITIVITY) - Abnormal; Notable for the following components:   Troponin I (High Sensitivity) 23 (*)    All other components within normal limits  TROPONIN I (HIGH SENSITIVITY) - Abnormal; Notable for the following components:   Troponin I (High Sensitivity) 30 (*)    All other components within normal limits  CULTURE, BLOOD (ROUTINE X 2)  CULTURE, BLOOD (ROUTINE X 2)  URINE CULTURE  SARS CORONAVIRUS 2 (TAT 6-24 HRS)  APTT    EKG EKG Interpretation  Date/Time:  Monday July 11 2019 12:33:01 EDT Ventricular Rate:  75 PR Interval:    QRS Duration: 138 QT Interval:  431 QTC Calculation: 482 R Axis:   -49 Text Interpretation:  Sinus rhythm Left bundle branch block since last tracing no significant change Confirmed by Eber HongMiller, Brian ([id number]) on 07/11/2019 1:29:36 PM   Radiology Ct Head Wo Contrast  Result Date: 07/11/2019 CLINICAL DATA:  Altered mental status. Unexplained hypoxia. EXAM: CT HEAD WITHOUT CONTRAST TECHNIQUE: Contiguous axial images were obtained from the base of the skull through the vertex without intravenous contrast. COMPARISON:  10/02/2015. FINDINGS: Brain: Stable moderately enlarged ventricles and subarachnoid spaces. Stable mild patchy white matter low density in both cerebral hemispheres. No intracranial hemorrhage, mass lesion or CT evidence of acute infarction. Vascular: No hyperdense vessel or unexpected calcification. Skull: Normal. Negative for fracture or focal lesion. Sinuses/Orbits: No acute finding. Other: Deviation of the midportion of the nasal septum to the right and mild left concha bullosa. IMPRESSION: 1. No acute abnormality. 2. Stable moderate diffuse cerebral and cerebellar atrophy and mild chronic small vessel white matter  ischemic changes in both cerebral hemispheres. Electronically Signed   By: Beckie Salts M.D.   On: 07/11/2019 18:25   Dg Chest Port 1 View  Result Date: 07/11/2019 CLINICAL DATA:  Weakness. Hypoxia. EXAM: PORTABLE CHEST 1 VIEW COMPARISON:  02/05/2015 FINDINGS: The heart size and pulmonary vascularity are normal the lungs are clear. No effusions. No appreciable bone abnormality. IMPRESSION: No active disease. Electronically Signed   By: Francene Boyers M.D.   On: 07/11/2019 13:18    Procedures .Critical Care Performed by: Jeanie Sewer, PA-C Authorized by: Jeanie Sewer, PA-C   Critical care provider statement:    Critical care time (minutes):  45   Critical care was necessary to treat or prevent imminent or life-threatening deterioration of the following conditions:  Sepsis   Critical care was time spent personally by me on the following activities:  Discussions with consultants, evaluation of patient's response to treatment, examination of patient, ordering and performing treatments and interventions, ordering and review of laboratory studies, ordering and review of radiographic studies, pulse oximetry, re-evaluation of patient's condition, obtaining history from patient or surrogate and review of old charts   I assumed direction of critical care for this patient from another provider in my specialty: no     (including critical care time)  Medications Ordered in ED Medications  metroNIDAZOLE (FLAGYL) IVPB 500 mg (has no administration in time range)  vancomycin (VANCOCIN) 1,500 mg in sodium chloride 0.9 % 500 mL IVPB (1,500 mg Intravenous New Bag/Given 07/11/19 1802)  sodium chloride 0.9 % bolus 500 mL (0 mLs Intravenous Hold 07/11/19 1823)  ceFEPIme (MAXIPIME) 2 g in sodium chloride 0.9 % 100 mL IVPB (has no administration in time range)  vancomycin (VANCOCIN) 1,250 mg in sodium chloride 0.9 % 250 mL IVPB (0 mg Intravenous Hold 07/11/19 1748)  sodium chloride 0.9 % bolus 500 mL (0 mLs  Intravenous Stopped 07/11/19 1611)  ondansetron (ZOFRAN) injection 4 mg (4 mg Intravenous Given 07/11/19 1610)  sodium chloride 0.9 % bolus 500 mL (0 mLs Intravenous Stopped 07/11/19 1841)  ceFEPIme (MAXIPIME) 2 g in sodium chloride 0.9 % 100 mL IVPB (0 g Intravenous Stopped 07/11/19 1802)     Initial Impression / Assessment and Plan / ED Course  I have reviewed the triage vital signs and the nursing notes.  Pertinent labs & imaging results that were available during my care of the patient were reviewed by me and considered in my medical decision making (see chart for details).        NANCIE BOCANEGRA was evaluated in Emergency Department on 07/11/2019 for the symptoms described in the history of present illness. She was evaluated in the context of the global COVID-19 pandemic, which necessitated consideration that the patient might be at risk for infection with the SARS-CoV-2 virus that causes COVID-19. Institutional protocols and algorithms that pertain to the evaluation of patients at risk for COVID-19 are in a state of rapid change based on information released by  regulatory bodies including the CDC and federal and state organizations. These policies and algorithms were followed during the patient's care in the ED.  Patient presenting for evaluation sent from facility for generalized weakness, decreased oral intake, hypoxia, and emesis.  On initial presentation to the ED she was afebrile, was able to be weaned off of supplemental oxygen with SPO2 saturations around 92% on room air but with soft blood pressures.  She was oriented to person, city, and knew that Garnet Koyanagi is the president of the [place].  No focal neurologic deficits on assessment, moving all extremities without difficulty but does have tremulousness of the bilateral upper extremities which the patient's daughter notes is new as of the last few months.  Head CT without acute intracranial abnormalities.     Lab work  reviewed by me significant for elevated lactate.  This coupled with hypotension prompted calling code sepsis, broad-spectrum antibiotics initiated.  UA does not suggest UTI and chest x-ray shows no acute cardiopulmonary abnormalities.  Patient was given 500 cc boluses at a time to avoid volume overload; MAPs increased on reevaluation  EKG shows no acute ischemic abnormalities.  Her initial troponin is mildly elevated at 23, second troponin increased to 30.  Suspect this is likely in the setting of demand due to sepsis.  While in the ED patient oxygen saturations decreased down to 75%, improved on 3 L supplemental oxygen via nasal cannula.  Will require admission for evaluation of sepsis with hypoxia.  Concern for possible aspiration pneumonia.  Spoke with Dr. Sharl Ma with [facility] hospitalist service who agrees to assume care of patient and bring her into the hospital for further evaluation and management.  Covid test is pending.  Final Clinical Impressions(s) / ED Diagnoses   Final diagnoses:  Sepsis with acute hypoxic respiratory failure without septic shock, due to unspecified organism [facility])    ED Discharge Orders    None       Bennye Alm 07/11/19 1938    Maia Plan, MD 07/12/19 1235

## 2019-07-11 NOTE — ED Notes (Signed)
Pt finally allowed myself and PA to put her oxygen on. Is now on 3L [place] with sats improving. Last bolus on hold due to some slight crackles in bases. Discussed this with [place] PA.

## 2019-07-12 ENCOUNTER — Other Ambulatory Visit: Payer: Self-pay

## 2019-07-12 DIAGNOSIS — R531 Weakness: Secondary | ICD-10-CM | POA: Diagnosis not present

## 2019-07-12 DIAGNOSIS — A419 Sepsis, unspecified organism: Secondary | ICD-10-CM | POA: Diagnosis not present

## 2019-07-12 DIAGNOSIS — J9601 Acute respiratory failure with hypoxia: Secondary | ICD-10-CM | POA: Diagnosis not present

## 2019-07-12 LAB — COMPREHENSIVE METABOLIC PANEL
ALT: 22 U/L (ref 0–44)
AST: 25 U/L (ref 15–41)
Albumin: 2.8 g/dL — ABNORMAL LOW (ref 3.5–5.0)
Alkaline Phosphatase: 95 U/L (ref 38–126)
Anion gap: 6 (ref 5–15)
BUN: 23 mg/dL (ref 8–23)
CO2: 22 mmol/L (ref 22–32)
Calcium: 8.1 mg/dL — ABNORMAL LOW (ref 8.9–10.3)
Chloride: 113 mmol/L — ABNORMAL HIGH (ref 98–111)
Creatinine, Ser: 0.81 mg/dL (ref 0.44–1.00)
GFR calc Af Amer: >60 mL/min (ref >60)
GFR calc non Af Amer: >60 mL/min (ref >60)
Glucose, Bld: 150 mg/dL — ABNORMAL HIGH (ref 70–99)
Potassium: 4.1 mmol/L (ref 3.5–5.1)
Sodium: 141 mmol/L (ref 135–145)
Total Bilirubin: 1.2 mg/dL (ref 0.3–1.2)
Total Protein: 4.9 g/dL — ABNORMAL LOW (ref 6.5–8.1)

## 2019-07-12 LAB — CBC
HCT: 30.8 % — ABNORMAL LOW (ref 36.0–46.0)
Hemoglobin: 9.9 g/dL — ABNORMAL LOW (ref 12.0–15.0)
MCH: 31.7 pg (ref 26.0–34.0)
MCHC: 32.1 g/dL (ref 30.0–36.0)
MCV: 98.7 fL (ref 80.0–100.0)
Platelets: 40 10*3/uL — ABNORMAL LOW (ref 150–400)
RBC: 3.12 MIL/uL — ABNORMAL LOW (ref 3.87–5.11)
RDW: 13.1 % (ref 11.5–15.5)
WBC: 5.2 10*3/uL (ref 4.0–10.5)
nRBC: 0 % (ref 0.0–0.2)

## 2019-07-12 LAB — MRSA PCR SCREENING: MRSA by PCR: POSITIVE — AB

## 2019-07-12 LAB — SARS CORONAVIRUS 2 (TAT 6-24 HRS): SARS Coronavirus 2: NEGATIVE

## 2019-07-12 MED ORDER — ORAL CARE MOUTH RINSE
15.0000 mL | Freq: Two times a day (BID) | OROMUCOSAL | Status: DC
  Administered 2019-07-12: 15 mL via OROMUCOSAL

## 2019-07-12 MED ORDER — CHLORHEXIDINE GLUCONATE CLOTH 2 % EX PADS
6.0000 | MEDICATED_PAD | Freq: Every day | CUTANEOUS | Status: DC
  Administered 2019-07-12: 6 via TOPICAL

## 2019-07-12 NOTE — Progress Notes (Signed)
Patient is to be discharged back to [facility] and in stable condition. Patient report given to Crystal, accepting med tech at [facility]. Patient family made aware of discharge and agreeable. Patient to be transported back to [facility] by EMS upon their arrival.  Celestia Khat, RN

## 2019-07-12 NOTE — ED Notes (Signed)
ED TO INPATIENT HANDOFF REPORT  ED Nurse Name and Phone #: [phone]  S Name/Age/Gender Jennifer Kerr 85 y.o. female Room/Bed: APA09/APA09  Code Status   Code Status: Full Code  Home/SNF/Other Nursing Home Patient oriented to: self Is this baseline? No   Triage Complete: Triage complete  Chief Complaint Shortness of Breath  Triage Note Pt sent [facility] for evaluation of o2 sat in 70s. EMS reports sats in 90s. Pt in no distress and has no complaints but wanted her checked out.    Allergies No Known Allergies  Level of Care/Admitting Diagnosis ED Disposition    ED Disposition Condition Comment   Admit  Hospital Area: [facility] [[id number]]  Level of Care: Stepdown [14]  Covid Evaluation: Asymptomatic Screening Protocol (No Symptoms)  Diagnosis: Acute respiratory failure with hypoxia [facility](HCC) [[id number]]) [[id number]]  Admitting Physician: Lovie CholLAMA, GAGAN S [[id number]]  Attending Physician: Meredeth IdeLAMA, GAGAN S [[id number]]  Estimated length of stay: 3 - 4 days  Certification:: I certify this patient will need inpatient services for at least 2 midnights  PT Class (Do Not Modify): Inpatient [101]  PT Acc Code (Do Not Modify): Private [1]       B Medical/Surgery History Past Medical History:  Diagnosis Date  . Cerebrovascular disease   . Dementia (HCC)   . Depression   . DM type 2 (diabetes mellitus, type 2) (HCC) 07/02/2011  . Elevated LFTs   . Frequent falls   . HTN (hypertension) 07/02/2011  . Hypertension   . Thrombocytopenia (HCC)   . UTI (lower urinary tract infection)   . UTI (urinary tract infection)    Past Surgical History:  Procedure Laterality Date  . ABDOMINAL HYSTERECTOMY    . CHOLECYSTECTOMY       A IV Location/Drains/Wounds Patient Lines/Drains/Airways Status   Active Line/Drains/Airways    Name:   Placement date:   Placement time:   Site:   Days:   Peripheral IV 07/11/19 Right Hand   07/11/19    1358    Hand   1   Peripheral IV 07/11/19 Right  Antecubital   07/11/19    1750    Antecubital   1   Peripheral IV 07/11/19 Left Antecubital   07/11/19    2128    Antecubital   1          Intake/Output Last 24 hours  Intake/Output Summary (Last 24 hours) at 07/12/2019 [id number] Last data filed at 07/12/2019 0536 Gross per 24 hour  Intake 2526.19 ml  Output 13 ml  Net 2513.19 ml    Labs/Imaging Results for orders placed or performed during the hospital encounter of 07/11/19 (from the past 48 hour(s))  Urinalysis, Routine w reflex microscopic     Status: Abnormal   Collection Time: 07/11/19  1:03 PM  Result Value Ref Range   Color, Urine AMBER (A) YELLOW    Comment: BIOCHEMICALS MAY BE AFFECTED BY COLOR   APPearance CLOUDY (A) CLEAR   Specific Gravity, Urine 1.027 1.005 - 1.030   pH 5.0 5.0 - 8.0   Glucose, UA NEGATIVE NEGATIVE mg/dL   Hgb urine dipstick NEGATIVE NEGATIVE   Bilirubin Urine NEGATIVE NEGATIVE   Ketones, ur NEGATIVE NEGATIVE mg/dL   Protein, ur 30 (A) NEGATIVE mg/dL   Nitrite NEGATIVE NEGATIVE   Leukocytes,Ua SMALL (A) NEGATIVE   RBC / HPF 0-5 0 - 5 RBC/hpf   WBC, UA 6-10 0 - 5 WBC/hpf   Bacteria, UA RARE (A) NONE SEEN   Squamous  Epithelial / LPF 6-10 0 - 5   Mucus PRESENT    Hyaline Casts, UA PRESENT     Comment: Performed at [facility], [address] [place], [place] [address]  POC CBG, ED     Status: Abnormal   Collection Time: 07/11/19  1:35 PM  Result Value Ref Range   Glucose-Capillary 224 (H) 70 - 99 mg/dL  Lactic acid, plasma     Status: Abnormal   Collection Time: 07/11/19  1:47 PM  Result Value Ref Range   Lactic Acid, Venous 3.5 (HH) 0.5 - 1.9 mmol/L    Comment: CRITICAL RESULT CALLED TO, READ BACK BY AND VERIFIED WITH: CRUISE,M AT 1443 ON 10.19.20 BY ISLEY,B Performed at [facility], [address] [place], [place] [address]   Comprehensive metabolic panel     Status: Abnormal   Collection Time: 07/11/19  1:47 PM  Result Value Ref Range   Sodium 138 135 - 145 mmol/L   Potassium 4.0 3.5  - 5.1 mmol/L   Chloride 105 98 - 111 mmol/L   CO2 22 22 - 32 mmol/L   Glucose, Bld 232 (H) 70 - 99 mg/dL   BUN 22 8 - 23 mg/dL   Creatinine, Ser 0.98 (H) 0.44 - 1.00 mg/dL   Calcium 9.0 8.9 - 11.9 mg/dL   Total Protein 6.1 (L) 6.5 - 8.1 g/dL   Albumin 3.5 3.5 - 5.0 g/dL   AST 39 15 - 41 U/L   ALT 31 0 - 44 U/L   Alkaline Phosphatase 139 (H) 38 - 126 U/L   Total Bilirubin 2.2 (H) 0.3 - 1.2 mg/dL   GFR calc non Af Amer 44 (L) >60 mL/min   GFR calc Af Amer 51 (L) >60 mL/min   Anion gap 11 5 - 15    Comment: Performed at [facility], [address] [place], [place] [address]  CBC WITH DIFFERENTIAL     Status: Abnormal   Collection Time: 07/11/19  1:47 PM  Result Value Ref Range   WBC 9.6 4.0 - 10.5 K/uL   RBC 4.02 3.87 - 5.11 MIL/uL   Hemoglobin 12.7 12.0 - 15.0 g/dL   HCT 95.6 21.3 - 08.6 %   MCV 97.8 80.0 - 100.0 fL   MCH 31.6 26.0 - 34.0 pg   MCHC 32.3 30.0 - 36.0 g/dL   RDW 57.8 46.9 - 62.9 %   Platelets 69 (L) 150 - 400 K/uL    Comment: PLATELET COUNT CONFIRMED BY SMEAR SPECIMEN CHECKED FOR CLOTS Immature Platelet Fraction may be clinically indicated, consider ordering this additional test [id number]    nRBC 0.0 0.0 - 0.2 %   Neutrophils Relative % 92 %   Neutro Abs 8.8 (H) 1.7 - 7.7 K/uL   Lymphocytes Relative 3 %   Lymphs Abs 0.3 (L) 0.7 - 4.0 K/uL   Monocytes Relative 4 %   Monocytes Absolute 0.4 0.1 - 1.0 K/uL   Eosinophils Relative 0 %   Eosinophils Absolute 0.0 0.0 - 0.5 K/uL   Basophils Relative 0 %   Basophils Absolute 0.0 0.0 - 0.1 K/uL   Immature Granulocytes 1 %   Abs Immature Granulocytes 0.06 0.00 - 0.07 K/uL    Comment: Performed at [facility], [address] [place], [place] [address]  Troponin I (High Sensitivity)     Status: Abnormal   Collection Time: 07/11/19  1:47 PM  Result Value Ref Range   Troponin I (High Sensitivity) 23 (H) <18 ng/L  Comment: (NOTE) Elevated high sensitivity troponin I (hsTnI) values and significant  changes across  serial measurements may suggest ACS but many other  chronic and acute conditions are known to elevate hsTnI results.  Refer to the "Links" section for chest pain algorithms and additional  guidance. Performed at [facility], [address] [place], [place] [address]   Lactic acid, plasma     Status: Abnormal   Collection Time: 07/11/19  3:28 PM  Result Value Ref Range   Lactic Acid, Venous 3.2 (HH) 0.5 - 1.9 mmol/L    Comment: CRITICAL VALUE NOTED.  VALUE IS CONSISTENT WITH PREVIOUSLY REPORTED AND CALLED VALUE. Performed at [facility], [address], [place], [place] [address]   Troponin I (High Sensitivity)     Status: Abnormal   Collection Time: 07/11/19  3:28 PM  Result Value Ref Range   Troponin I (High Sensitivity) 30 (H) <18 ng/L    Comment: (NOTE) Elevated high sensitivity troponin I (hsTnI) values and significant  changes across serial measurements may suggest ACS but many other  chronic and acute conditions are known to elevate hsTnI results.  Refer to the "Links" section for chest pain algorithms and additional  guidance. Performed at [facility], [address] [place], [place] [address]   SARS CORONAVIRUS 2 (TAT 6-24 HRS) Nasopharyngeal Nasopharyngeal Swab     Status: None   Collection Time: 07/11/19  4:07 PM   Specimen: Nasopharyngeal Swab  Result Value Ref Range   SARS Coronavirus 2 NEGATIVE NEGATIVE    Comment: (NOTE) SARS-CoV-2 target nucleic acids are NOT DETECTED. The SARS-CoV-2 RNA is generally detectable in upper and lower respiratory specimens during the acute phase of infection. Negative results do not preclude SARS-CoV-2 infection, do not rule out co-infections with other pathogens, and should not be used as the sole basis for treatment or other patient management decisions. Negative results must be combined with clinical observations, patient history, and epidemiological information. The expected result is Negative. Fact Sheet for  Patients: HairSlick.no Fact Sheet for Healthcare Providers: quierodirigir.com This test is not yet approved or cleared by the [place] FDA and  has been authorized for detection and/or diagnosis of SARS-CoV-2 by FDA under an Emergency Use Authorization (EUA). This EUA will remain  in effect (meaning this test can be used) for the duration of the COVID-19 declaration under Section 56 4(b)(1) of the Act, 21 U.S.C. section 360bbb-3(b)(1), unless the authorization is terminated or revoked sooner. Performed at [facility], [address], [place], [place] [address]   Blood Culture (routine x 2)     Status: None (Preliminary result)   Collection Time: 07/11/19  4:22 PM   Specimen: BLOOD LEFT WRIST  Result Value Ref Range   Specimen Description BLOOD LEFT WRIST    Special Requests      BOTTLES DRAWN AEROBIC AND ANAEROBIC Blood Culture adequate volume Performed at [facility], [address] [place], [place] [address]    Culture PENDING    Report Status PENDING   Blood Culture (routine x 2)     Status: None (Preliminary result)   Collection Time: 07/11/19  4:22 PM   Specimen: Right Antecubital; Blood  Result Value Ref Range   Specimen Description RIGHT ANTECUBITAL    Special Requests      BOTTLES DRAWN AEROBIC AND ANAEROBIC Blood Culture adequate volume Performed at [facility], [address] [place], [place] [address]    Culture PENDING    Report Status PENDING  APTT     Status: None   Collection Time: 07/11/19  4:23 PM  Result Value Ref Range   aPTT 36 24 - 36 seconds    Comment: Performed at [facility], [address] [place], [place] [address]  Protime-INR     Status: Abnormal   Collection Time: 07/11/19  4:23 PM  Result Value Ref Range   Prothrombin Time 19.0 (H) 11.4 - 15.2 seconds   INR 1.6 (H) 0.8 - 1.2    Comment: (NOTE) INR goal varies based on device and disease states. Performed at [facility], [address] [place], [place] [address]   Brain natriuretic peptide     Status: None   Collection Time: 07/11/19  4:23 PM  Result Value Ref Range   B Natriuretic Peptide 85.0 0.0 - 100.0 pg/mL    Comment: Performed at [facility], [address] [place], [place] [address]  Procalcitonin - Baseline     Status: None   Collection Time: 07/11/19  4:23 PM  Result Value Ref Range   Procalcitonin 0.16 ng/mL    Comment:        Interpretation: PCT (Procalcitonin) <= 0.5 ng/mL: Systemic infection (sepsis) is not likely. Local bacterial infection is possible. (NOTE)       Sepsis PCT Algorithm           Lower Respiratory Tract                                      Infection PCT Algorithm    ----------------------------     ----------------------------         PCT < 0.25 ng/mL                PCT < 0.10 ng/mL         Strongly encourage             Strongly discourage   discontinuation of antibiotics    initiation of antibiotics    ----------------------------     -----------------------------       PCT 0.25 - 0.50 ng/mL            PCT 0.10 - 0.25 ng/mL               OR       >80% decrease in PCT            Discourage initiation of                                            antibiotics      Encourage discontinuation           of antibiotics    ----------------------------     -----------------------------         PCT >= 0.50 ng/mL              PCT 0.26 - 0.50 ng/mL               AND        <80% decrease in PCT             Encourage initiation of  antibiotics       Encourage continuation           of antibiotics    ----------------------------     -----------------------------        PCT >= 0.50 ng/mL                  PCT > 0.50 ng/mL               AND         increase in PCT                  Strongly encourage                                      initiation of antibiotics    Strongly encourage escalation           of antibiotics                                      -----------------------------                                           PCT <= 0.25 ng/mL                                                 OR                                        > 80% decrease in PCT                                     Discontinue / Do not initiate                                             antibiotics Performed at [facility], [address] [place], [place] [address]   CBC     Status: Abnormal   Collection Time: 07/12/19  5:20 AM  Result Value Ref Range   WBC 5.2 4.0 - 10.5 K/uL   RBC 3.12 (L) 3.87 - 5.11 MIL/uL   Hemoglobin 9.9 (L) 12.0 - 15.0 g/dL   HCT 82.9 (L) 56.2 - 13.0 %   MCV 98.7 80.0 - 100.0 fL   MCH 31.7 26.0 - 34.0 pg   MCHC 32.1 30.0 - 36.0 g/dL   RDW 86.5 78.4 - 69.6 %   Platelets 40 (L) 150 - 400 K/uL    Comment: PLATELET COUNT CONFIRMED BY SMEAR SPECIMEN CHECKED FOR CLOTS Immature Platelet Fraction may be clinically indicated, consider ordering this additional test [id number]    nRBC 0.0 0.0 - 0.2 %    Comment: Performed at [facility], [address] [place], [place] [address]  Comprehensive metabolic panel     Status: Abnormal   Collection  Time: 07/12/19  5:20 AM  Result Value Ref Range   Sodium 141 135 - 145 mmol/L   Potassium 4.1 3.5 - 5.1 mmol/L   Chloride 113 (H) 98 - 111 mmol/L   CO2 22 22 - 32 mmol/L   Glucose, Bld 150 (H) 70 - 99 mg/dL   BUN 23 8 - 23 mg/dL   Creatinine, Ser 1.61 0.44 - 1.00 mg/dL   Calcium 8.1 (L) 8.9 - 10.3 mg/dL   Total Protein 4.9 (L) 6.5 - 8.1 g/dL   Albumin 2.8 (L) 3.5 - 5.0 g/dL   AST 25 15 - 41 U/L   ALT 22 0 - 44 U/L   Alkaline Phosphatase 95 38 - 126 U/L   Total Bilirubin 1.2 0.3 - 1.2 mg/dL   GFR calc non Af Amer >60 >60 mL/min   GFR calc Af Amer >60 >60 mL/min   Anion gap 6 5 - 15    Comment: Performed at [facility], [address], [place], [place] [address]   Ct Head Wo Contrast  Result Date: 07/11/2019 CLINICAL DATA:  Altered mental status. Unexplained  hypoxia. EXAM: CT HEAD WITHOUT CONTRAST TECHNIQUE: Contiguous axial images were obtained from the base of the skull through the vertex without intravenous contrast. COMPARISON:  10/02/2015. FINDINGS: Brain: Stable moderately enlarged ventricles and subarachnoid spaces. Stable mild patchy white matter low density in both cerebral hemispheres. No intracranial hemorrhage, mass lesion or CT evidence of acute infarction. Vascular: No hyperdense vessel or unexpected calcification. Skull: Normal. Negative for fracture or focal lesion. Sinuses/Orbits: No acute finding. Other: Deviation of the midportion of the nasal septum to the right and mild left concha bullosa. IMPRESSION: 1. No acute abnormality. 2. Stable moderate diffuse cerebral and cerebellar atrophy and mild chronic small vessel white matter ischemic changes in both cerebral hemispheres. Electronically Signed   By: Beckie Salts M.D.   On: 07/11/2019 18:25   Ct Angio Chest Pe W Or Wo Contrast  Result Date: 07/11/2019 CLINICAL DATA:  Shortness of breath. Weakness. EXAM: CT ANGIOGRAPHY CHEST WITH CONTRAST TECHNIQUE: Multidetector CT imaging of the chest was performed using the standard protocol during bolus administration of intravenous contrast. Multiplanar CT image reconstructions and MIPs were obtained to evaluate the vascular anatomy. CONTRAST:  OMNIPAQUE IOHEXOL 350 MG/ML SOLN COMPARISON:  Radiograph earlier this day. Chest CTA 02/05/2015 FINDINGS: Cardiovascular: Evaluation limited by breathing motion artifact. Allowing for this, there are no filling defects within the pulmonary arteries to suggest pulmonary embolus. Atherosclerosis of the thoracic aorta without dissection. No evidence of acute aortic syndrome. Branch vessels are tortuous. Left vertebral artery arises directly from the thoracic aorta, normal variant arch anatomy. Mild cardiomegaly. No pericardial effusion. Mitral annulus calcifications. There are coronary artery calcifications.  Mediastinum/Nodes: Small right infrahilar nodes, nonspecific and not enlarged by size criteria. No enlarged mediastinal lymph nodes. No suspicious thyroid nodule. Decompressed esophagus. Lungs/Pleura: Central bronchial thickening, most prominent in the lower lobes. Symmetric dependent lung base opacities consistent with atelectasis. No evidence of pneumonia or pulmonary edema. No significant pleural fluid. No pulmonary mass. Upper Abdomen: No acute finding. Postcholecystectomy. Musculoskeletal: Exaggerated thoracic kyphosis. Chronic mild loss of height of T5 vertebra. Degenerative change throughout the spine. There are no acute or suspicious osseous abnormalities. Review of the MIP images confirms the above findings. IMPRESSION: 1. No pulmonary embolus or aortic dissection. 2. Central bronchial thickening, most prominent in the lower lobes, can be seen with bronchitis or reactive airways disease. 3. Mild dependent atelectasis.  No evidence of  pneumonia. 4. Coronary artery calcifications. Aortic Atherosclerosis (ICD10-I70.0). Electronically Signed   By: Keith Rake M.D.   On: 07/11/2019 22:17   Dg Chest Port 1 View  Result Date: 07/11/2019 CLINICAL DATA:  Weakness. Hypoxia. EXAM: PORTABLE CHEST 1 VIEW COMPARISON:  02/05/2015 FINDINGS: The heart size and pulmonary vascularity are normal the lungs are clear. No effusions. No appreciable bone abnormality. IMPRESSION: No active disease. Electronically Signed   By: Lorriane Shire M.D.   On: 07/11/2019 13:18    Pending Labs Unresulted Labs (From admission, onward)    Start     Ordered   07/18/19 0500  Creatinine, serum  (enoxaparin (LOVENOX)    CrCl >/= 30 ml/min)  Weekly,   R    Comments: while on enoxaparin therapy    07/11/19 2107   07/13/19 0500  Creatinine, serum  Every Mon-Wed-Fri (0500),   R     07/11/19 1658   07/11/19 1303  Urine culture  ONCE - STAT,   STAT     07/11/19 1302          Vitals/Pain Today's Vitals   07/12/19 0430  07/12/19 0530 07/12/19 0600 07/12/19 0630  BP: (!) 96/47 106/70 117/64 (!) 116/50  Pulse: 70 73 74 74  Resp: 17 18 18 19   Temp:      TempSrc:      SpO2: 99% 100% 100% 100%  Weight:      Height:        Isolation Precautions No active isolations  Medications Medications  sodium chloride 0.9 % bolus 500 mL (0 mLs Intravenous Hold 07/11/19 1823)  ceFEPIme (MAXIPIME) 2 g in sodium chloride 0.9 % 100 mL IVPB (0 g Intravenous Stopped 07/12/19 0544)  vancomycin (VANCOCIN) 1,250 mg in sodium chloride 0.9 % 250 mL IVPB (0 mg Intravenous Hold 07/11/19 1748)  acetaminophen (TYLENOL) tablet 500-1,000 mg (has no administration in time range)  aspirin EC tablet 81 mg (has no administration in time range)  donepezil (ARICEPT) tablet 5 mg (5 mg Oral Refused 07/11/19 2250)  sertraline (ZOLOFT) tablet 25 mg (has no administration in time range)  levothyroxine (SYNTHROID) tablet 25 mcg (has no administration in time range)  loratadine (CLARITIN) tablet 10 mg (10 mg Oral Not Given 07/11/19 2116)  enoxaparin (LOVENOX) injection 40 mg (40 mg Subcutaneous Not Given 07/11/19 2116)  0.9 %  sodium chloride infusion ( Intravenous Rate/Dose Verify 07/12/19 0536)  acetaminophen (TYLENOL) tablet 650 mg (has no administration in time range)    Or  acetaminophen (TYLENOL) suppository 650 mg (has no administration in time range)  ondansetron (ZOFRAN) tablet 4 mg (has no administration in time range)    Or  ondansetron (ZOFRAN) injection 4 mg (has no administration in time range)  sodium chloride 0.9 % bolus 500 mL (0 mLs Intravenous Stopped 07/11/19 1611)  ondansetron (ZOFRAN) injection 4 mg (4 mg Intravenous Given 07/11/19 1610)  sodium chloride 0.9 % bolus 500 mL (0 mLs Intravenous Stopped 07/11/19 1841)  ceFEPIme (MAXIPIME) 2 g in sodium chloride 0.9 % 100 mL IVPB (0 g Intravenous Stopped 07/11/19 1802)  metroNIDAZOLE (FLAGYL) IVPB 500 mg (0 mg Intravenous Stopped 07/11/19 2110)  vancomycin (VANCOCIN) 1,500  mg in sodium chloride 0.9 % 500 mL IVPB (0 mg Intravenous Stopped 07/11/19 2007)  iohexol (OMNIPAQUE) 350 MG/ML injection 100 mL (100 mLs Intravenous Contrast Given 07/11/19 2145)    Mobility walks with device High fall risk   Focused Assessments Pulmonary Assessment Handoff:  Lung sounds:   O2 Device: Nasal Cannula  O2 Flow Rate (L/min): 3 L/min      R Recommendations: See Admitting Provider Note  Report given to:   Additional Notes: Pt is on oxygen, confused and screams when you touch her.

## 2019-07-12 NOTE — Evaluation (Signed)
Clinical/Bedside Swallow Evaluation Patient Details  Name: Jennifer Kerr MRN: [id number] Date of Birth: 08-Apr-1932  Today's Date: 07/12/2019 Time: SLP Start Time (ACUTE ONLY): 1010 SLP Stop Time (ACUTE ONLY): 1031 SLP Time Calculation (min) (ACUTE ONLY): 21 min  Past Medical History:  Past Medical History:  Diagnosis Date  . Cerebrovascular disease   . Dementia (HCC)   . Depression   . DM type 2 (diabetes mellitus, type 2) (HCC) 07/02/2011  . Elevated LFTs   . Frequent falls   . HTN (hypertension) 07/02/2011  . Hypertension   . Thrombocytopenia (HCC)   . UTI (lower urinary tract infection)   . UTI (urinary tract infection)    Past Surgical History:  Past Surgical History:  Procedure Laterality Date  . ABDOMINAL HYSTERECTOMY    . CHOLECYSTECTOMY     HPI:  85 y.o. female, with history of CVA, type diabetes mellitus, hypertension, dementia was sent from the skilled facility for chief complaints of weakness and hypoxia.  Patient is a poor historian so history obtained from the ED notes.  EMS was called out at the facility, as per nursing facility patient's O2 sats was 70% range.  However when EMS arrived O2 sats were in the 90s without supplemental oxygen; admitted to ICU for acute respiratory failure with hypoxia; Covid negative; CT chest indicated Mild dependent atelectasis.  No evidence of pneumonia.BSE generated d/t poor po intake.   Assessment / Plan / Recommendation Clinical Impression   Pt without overt s/s of aspiration with liquids via cup/straw and puree; refused solids despite encouragement; generalized oral weakness noted, but functional for adequate propulsion/timely swallow to transition various boluses into pharynx safely; repetitive straw sips without difficulty noted during PO intake; pt at mild risk for aspiration d/t cognitive deficits/hx of dementia/CVA; recommend continue current diet of Dysphagia 3/thin liquids via cup/straw with general swallowing precautions in  place; will f/u while in acute setting for diet tolerance/aspiration/swallowing precaution education with pt/family.  Thank you for this consult. SLP Visit Diagnosis: Dysphagia, unspecified (R13.10)    Aspiration Risk  Mild aspiration risk    Diet Recommendation   Dysphagia 3/thin liquids  Medication Administration: Whole meds with puree    Other  Recommendations Oral Care Recommendations: Oral care BID   Follow up Recommendations Skilled Nursing facility      Frequency and Duration min 1 x/week  1 week       Prognosis Prognosis for Safe Diet Advancement: Good Barriers to Reach Goals: Cognitive deficits      Swallow Study   General Date of Onset: 07/11/19 HPI: 85 y.o. female, with history of CVA, type diabetes mellitus, hypertension, dementia was sent from the skilled facility for chief complaints of weakness and hypoxia.  Patient is a poor historian so history obtained from the ED notes.  EMS was called out at the facility, as per nursing facility patient's O2 sats was 70% range.  However when EMS arrived O2 sats were in the 90s without supplemental oxygen.   Type of Study: Bedside Swallow Evaluation Diet Prior to this Study: Dysphagia 3 (soft);Thin liquids Temperature Spikes Noted: No Respiratory Status: Nasal cannula History of Recent Intubation: No Behavior/Cognition: Alert;Cooperative;Confused;Requires cueing Oral Cavity Assessment: Within Functional Limits Oral Care Completed by SLP: Recent completion by staff Oral Cavity - Dentition: Edentulous Self-Feeding Abilities: Needs assist Patient Positioning: Upright in bed;Other (comment)(upright as high as tolerated d/t pain) Baseline Vocal Quality: Low vocal intensity Volitional Cough: Cognitively unable to elicit Volitional Swallow: Unable to elicit  Oral/Motor/Sensory Function Overall Oral Motor/Sensory Function: Generalized oral weakness Facial ROM: Within Functional Limits Facial Symmetry: Within Functional Limits    Ice Chips Ice chips: Not tested   Thin Liquid Thin Liquid: Within functional limits Presentation: Cup;Straw    Nectar Thick Nectar Thick Liquid: Not tested   Honey Thick Honey Thick Liquid: Not tested   Puree Puree: Within functional limits Presentation: Spoon   Solid     Solid: Not tested      Elvina Sidle, M.S., CCC-SLP 07/12/2019,10:58 AM

## 2019-07-12 NOTE — TOC Transition Note (Signed)
Transition of Care [facility]) - CM/SW Discharge Note   Patient Details  Name: Jennifer Kerr MRN: [id number] Date of Birth: 1931/12/14  [facility] CM/SW Contact:  Boneta Lucks, RN Phone Number: 07/12/2019, 2:53 PM   Clinical Narrative:   Patient discharging back to [facility]F. Patient needing oxygen. Called Nanine Means they requested [employer].  Faxed orders, they will deliver tank to [facility] at 3:30 or 4:00. Facility and [name] - daughter updated. RN to called EMS for transport.  PT is also recommending PT.  ALF is requesting [facility].  CM called Judson Roch she took the referral and will set up [facility].   Final next level of care: Assisted Living Barriers to Discharge: Barriers Resolved   Patient Goals and CMS Choice Patient states their goals for this hospitalization and ongoing recovery are:: to go back to ALF.      Discharge Placement            Patient to be transferred to facility by: EMS Name of family member notified: Daughter - Leilani Able Patient and family notified of of transfer: 06/17/19  Discharge Plan and Services    HH Arranged: PT [facility] Agency: [place] Date [place]: 07/12/19 Time [place]: 1422 Representative spoke with at [place]: [place]    Readmission Risk Interventions No flowsheet data found.

## 2019-07-12 NOTE — Discharge Summary (Signed)
Physician Discharge Summary  Jennifer Kerr JKD:[id number] DOB: 03-Oct-1931 DOA: 07/11/2019  PCP: Celene Squibb, MD  Admit date: 07/11/2019  Discharge date: 07/12/2019  Admitted From:Home  Disposition:  Home  Recommendations for Outpatient Follow-up:  1. Follow up with PCP in 1-2 weeks 2. Continue on home oxygen as prescribed 3. Continue on other home medications as prior 4. Recommend incentive spirometry at bedside  Home Health: Yes with PT  Equipment/Devices: Home oxygen 2 L  Discharge Condition: Stable  CODE STATUS: DNR  Diet recommendation: Dysphagia 3 diet  Brief/Interim Summary: Per HPI:  Jennifer Kerr  is a 85 y.o. female, with history of CVA, type diabetes mellitus, hypertension, dementia was sent from the skilled facility for chief complaints of weakness and hypoxia.  Patient is a poor historian so history obtained from the ED notes.  EMS was called out at the facility, as per nursing facility patient's O2 sats was 70% range.  However when EMS arrived O2 sats were in the 90s without supplemental oxygen.  Patient denied chest pain or shortness of breath. As per patient's daughter [name] she was told by facility that patient has been more weak over the past 3 days.  Requires significant assistance with ambulation.  She had decreased oral intake.  She had an episode of emesis this morning which prompted them to check vital signs and she was found to be hypoxic.  Patient did not have any fever.  She was recently treated for UTI with Bactrim.  Patient is DNR as per the ED provider notes.  Patient was admitted with acute hypoxemic respiratory failure, but of unclear etiology.  CT of the chest did not demonstrate any findings of pneumonia or PE.  She still noted to have some mild hypoxemia and this was discussed with her daughter at bedside feels that she may have been hypoxic for a long period of time and this may have led to weakness.  She is noted to have some mild dependent atelectasis  as well as some findings of reactive airways disease, but no cough.  She will be discharged with 2 L nasal cannula oxygen and will require home health physical therapy.  Antibiotics have been discontinued as there is no finding of infection currently.  Discharge Diagnoses:  Active Problems:   Acute respiratory failure with hypoxia (HCC)  Principal discharge diagnosis: Acute hypoxemic respiratory failure likely secondary to dependent atelectasis.  Discharge Instructions  Discharge Instructions    Diet - low sodium heart healthy   Complete by: As directed    Increase activity slowly   Complete by: As directed      Allergies as of 07/12/2019   No Known Allergies     Medication List    TAKE these medications   acetaminophen 500 MG tablet Commonly known as: TYLENOL Take 500-1,000 mg by mouth every 8 (eight) hours as needed for mild pain or moderate pain.   aspirin EC 81 MG tablet Take 81 mg by mouth every morning.   cetirizine 10 MG tablet Commonly known as: ZYRTEC Take 10 mg by mouth at bedtime.   donepezil 5 MG tablet Commonly known as: ARICEPT Take 5 mg by mouth at bedtime.   levothyroxine 25 MCG tablet Commonly known as: SYNTHROID Take 25 mcg by mouth daily before breakfast.   lisinopril 10 MG tablet Commonly known as: ZESTRIL Take 10 mg by mouth every morning.   multivitamin with minerals Tabs tablet Take 1 tablet by mouth 2 (two) times daily.   sertraline  25 MG tablet Commonly known as: ZOLOFT Take 25 mg by mouth every morning.            Durable Medical Equipment  (From admission, onward)         Start     Ordered   07/12/19 1334  DME Oxygen  Once    Question Answer Comment  Length of Need Lifetime   Mode or (Route) Nasal cannula   Liters per Minute 2   Frequency Continuous (stationary and portable oxygen unit needed)   Oxygen conserving device No   Oxygen delivery system Gas      07/12/19 1333         Follow-up Information    Benita Stabile, MD Follow up in 1 week(s).   Specialty: Internal Medicine Contact information: [address] [place] [address] [phone]          No Known Allergies  Consultations:  None   Procedures/Studies: Ct Head Wo Contrast  Result Date: 07/11/2019 CLINICAL DATA:  Altered mental status. Unexplained hypoxia. EXAM: CT HEAD WITHOUT CONTRAST TECHNIQUE: Contiguous axial images were obtained from the base of the skull through the vertex without intravenous contrast. COMPARISON:  10/02/2015. FINDINGS: Brain: Stable moderately enlarged ventricles and subarachnoid spaces. Stable mild patchy white matter low density in both cerebral hemispheres. No intracranial hemorrhage, mass lesion or CT evidence of acute infarction. Vascular: No hyperdense vessel or unexpected calcification. Skull: Normal. Negative for fracture or focal lesion. Sinuses/Orbits: No acute finding. Other: Deviation of the midportion of the nasal septum to the right and mild left concha bullosa. IMPRESSION: 1. No acute abnormality. 2. Stable moderate diffuse cerebral and cerebellar atrophy and mild chronic small vessel white matter ischemic changes in both cerebral hemispheres. Electronically Signed   By: Beckie Salts M.D.   On: 07/11/2019 18:25   Ct Angio Chest Pe W Or Wo Contrast  Result Date: 07/11/2019 CLINICAL DATA:  Shortness of breath. Weakness. EXAM: CT ANGIOGRAPHY CHEST WITH CONTRAST TECHNIQUE: Multidetector CT imaging of the chest was performed using the standard protocol during bolus administration of intravenous contrast. Multiplanar CT image reconstructions and MIPs were obtained to evaluate the vascular anatomy. CONTRAST:  OMNIPAQUE IOHEXOL 350 MG/ML SOLN COMPARISON:  Radiograph earlier this day. Chest CTA 02/05/2015 FINDINGS: Cardiovascular: Evaluation limited by breathing motion artifact. Allowing for this, there are no filling defects within the pulmonary arteries to suggest pulmonary embolus.  Atherosclerosis of the thoracic aorta without dissection. No evidence of acute aortic syndrome. Branch vessels are tortuous. Left vertebral artery arises directly from the thoracic aorta, normal variant arch anatomy. Mild cardiomegaly. No pericardial effusion. Mitral annulus calcifications. There are coronary artery calcifications. Mediastinum/Nodes: Small right infrahilar nodes, nonspecific and not enlarged by size criteria. No enlarged mediastinal lymph nodes. No suspicious thyroid nodule. Decompressed esophagus. Lungs/Pleura: Central bronchial thickening, most prominent in the lower lobes. Symmetric dependent lung base opacities consistent with atelectasis. No evidence of pneumonia or pulmonary edema. No significant pleural fluid. No pulmonary mass. Upper Abdomen: No acute finding. Postcholecystectomy. Musculoskeletal: Exaggerated thoracic kyphosis. Chronic mild loss of height of T5 vertebra. Degenerative change throughout the spine. There are no acute or suspicious osseous abnormalities. Review of the MIP images confirms the above findings. IMPRESSION: 1. No pulmonary embolus or aortic dissection. 2. Central bronchial thickening, most prominent in the lower lobes, can be seen with bronchitis or reactive airways disease. 3. Mild dependent atelectasis.  No evidence of pneumonia. 4. Coronary artery calcifications. Aortic Atherosclerosis (ICD10-I70.0). Electronically Signed  By: Narda Rutherford M.D.   On: 07/11/2019 22:17   Dg Chest Port 1 View  Result Date: 07/11/2019 CLINICAL DATA:  Weakness. Hypoxia. EXAM: PORTABLE CHEST 1 VIEW COMPARISON:  02/05/2015 FINDINGS: The heart size and pulmonary vascularity are normal the lungs are clear. No effusions. No appreciable bone abnormality. IMPRESSION: No active disease. Electronically Signed   By: Francene Boyers M.D.   On: 07/11/2019 13:18     Discharge Exam: Vitals:   07/12/19 1135 07/12/19 1200  BP:  106/75  Pulse: 72 70  Resp: 17 18  Temp: 98.1 F (36.7  C)   SpO2: 100% 100%   Vitals:   07/12/19 0737 07/12/19 0746 07/12/19 1135 07/12/19 1200  BP: 124/74 (!) 114/94  106/75  Pulse: 82 81 72 70  Resp: (!) 27 19 17 18   Temp:  97.9 F (36.6 C) 98.1 F (36.7 C)   TempSrc:  Oral Oral   SpO2: 99% 100% 100% 100%  Weight:      Height:        General: Pt is alert, awake, not in acute distress Cardiovascular: RRR, S1/S2 +, no rubs, no gallops Respiratory: CTA bilaterally, no wheezing, no rhonchi, on 2 L nasal cannula oxygen Abdominal: Soft, NT, ND, bowel sounds + Extremities: no edema, no cyanosis    The results of significant diagnostics from this hospitalization (including imaging, microbiology, ancillary and laboratory) are listed below for reference.     Microbiology: Recent Results (from the past 240 hour(s))  SARS CORONAVIRUS 2 (TAT 6-24 HRS) Nasopharyngeal Nasopharyngeal Swab     Status: None   Collection Time: 07/11/19  4:07 PM   Specimen: Nasopharyngeal Swab  Result Value Ref Range Status   SARS Coronavirus 2 NEGATIVE NEGATIVE Final    Comment: (NOTE) SARS-CoV-2 target nucleic acids are NOT DETECTED. The SARS-CoV-2 RNA is generally detectable in upper and lower respiratory specimens during the acute phase of infection. Negative results do not preclude SARS-CoV-2 infection, do not rule out co-infections with other pathogens, and should not be used as the sole basis for treatment or other patient management decisions. Negative results must be combined with clinical observations, patient history, and epidemiological information. The expected result is Negative. Fact Sheet for Patients: 07/13/19 Fact Sheet for Healthcare Providers: HairSlick.no This test is not yet approved or cleared by the [place] FDA and  has been authorized for detection and/or diagnosis of SARS-CoV-2 by FDA under an Emergency Use Authorization (EUA). This EUA will remain  in effect  (meaning this test can be used) for the duration of the COVID-19 declaration under Section 56 4(b)(1) of the Act, 21 U.S.C. section 360bbb-3(b)(1), unless the authorization is terminated or revoked sooner. Performed at [facility], [address] [place], [place]   Blood Culture (routine x 2)     Status: None (Preliminary result)   Collection Time: 07/11/19  4:22 PM   Specimen: BLOOD LEFT WRIST  Result Value Ref Range Status   Specimen Description BLOOD LEFT WRIST  Final   Special Requests   Final    BOTTLES DRAWN AEROBIC AND ANAEROBIC Blood Culture adequate volume   Culture   Final    NO GROWTH < 24 HOURS Performed at [facility], [address], [place], [place]    Report Status PENDING  Incomplete  Blood Culture (routine x 2)     Status: None (Preliminary result)   Collection Time: 07/11/19  4:22 PM   Specimen: Right Antecubital; Blood  Result  Value Ref Range Status   Specimen Description RIGHT ANTECUBITAL  Final   Special Requests   Final    BOTTLES DRAWN AEROBIC AND ANAEROBIC Blood Culture adequate volume   Culture   Final    NO GROWTH < 24 HOURS Performed at [facility], [address] [place], [place] [id number]    Report Status PENDING  Incomplete     Labs: BNP (last 3 results) Recent Labs    07/11/19 1623  BNP 85.0   Basic Metabolic Panel: Recent Labs  Lab 07/11/19 1347 07/12/19 0520  NA 138 141  K 4.0 4.1  CL 105 113*  CO2 22 22  GLUCOSE 232* 150*  BUN 22 23  CREATININE 1.13* 0.81  CALCIUM 9.0 8.1*   Liver Function Tests: Recent Labs  Lab 07/11/19 1347 07/12/19 0520  AST 39 25  ALT 31 22  ALKPHOS 139* 95  BILITOT 2.2* 1.2  PROT 6.1* 4.9*  ALBUMIN 3.5 2.8*   No results for input(s): LIPASE, AMYLASE in the last 168 hours. No results for input(s): AMMONIA in the last 168 hours. CBC: Recent Labs  Lab 07/11/19 1347 07/12/19 0520  WBC 9.6 5.2  NEUTROABS 8.8*  --   HGB 12.7 9.9*  HCT 39.3 30.8*  MCV 97.8 98.7   PLT 69* 40*   Cardiac Enzymes: No results for input(s): CKTOTAL, CKMB, CKMBINDEX, TROPONINI in the last 168 hours. BNP: Invalid input(s): POCBNP CBG: Recent Labs  Lab 07/11/19 1335  GLUCAP 224*   D-Dimer No results for input(s): DDIMER in the last 72 hours. Hgb A1c No results for input(s): HGBA1C in the last 72 hours. Lipid Profile No results for input(s): CHOL, HDL, LDLCALC, TRIG, CHOLHDL, LDLDIRECT in the last 72 hours. Thyroid function studies No results for input(s): TSH, T4TOTAL, T3FREE, THYROIDAB in the last 72 hours.  Invalid input(s): FREET3 Anemia work up No results for input(s): VITAMINB12, FOLATE, FERRITIN, TIBC, IRON, RETICCTPCT in the last 72 hours. Urinalysis    Component Value Date/Time   COLORURINE AMBER (A) 07/11/2019 1303   APPEARANCEUR CLOUDY (A) 07/11/2019 1303   LABSPEC 1.027 07/11/2019 1303   PHURINE 5.0 07/11/2019 1303   GLUCOSEU NEGATIVE 07/11/2019 1303   HGBUR NEGATIVE 07/11/2019 1303   BILIRUBINUR NEGATIVE 07/11/2019 1303   KETONESUR NEGATIVE 07/11/2019 1303   PROTEINUR 30 (A) 07/11/2019 1303   UROBILINOGEN 1.0 02/05/2015 1338   NITRITE NEGATIVE 07/11/2019 1303   LEUKOCYTESUR SMALL (A) 07/11/2019 1303   Sepsis Labs Invalid input(s): PROCALCITONIN,  WBC,  LACTICIDVEN Microbiology Recent Results (from the past 240 hour(s))  SARS CORONAVIRUS 2 (TAT 6-24 HRS) Nasopharyngeal Nasopharyngeal Swab     Status: None   Collection Time: 07/11/19  4:07 PM   Specimen: Nasopharyngeal Swab  Result Value Ref Range Status   SARS Coronavirus 2 NEGATIVE NEGATIVE Final    Comment: (NOTE) SARS-CoV-2 target nucleic acids are NOT DETECTED. The SARS-CoV-2 RNA is generally detectable in upper and lower respiratory specimens during the acute phase of infection. Negative results do not preclude SARS-CoV-2 infection, do not rule out co-infections with other pathogens, and should not be used as the sole basis for treatment or other patient management  decisions. Negative results must be combined with clinical observations, patient history, and epidemiological information. The expected result is Negative. Fact Sheet for Patients: HairSlick.nohttps://www.fda.gov/media/138098/download Fact Sheet for Healthcare Providers: quierodirigir.comhttps://www.fda.gov/media/138095/download This test is not yet approved or cleared by the [place] FDA and  has been authorized for detection and/or diagnosis of SARS-CoV-2 by FDA under an Emergency  Use Authorization (EUA). This EUA will remain  in effect (meaning this test can be used) for the duration of the COVID-19 declaration under Section 56 4(b)(1) of the Act, 21 U.S.C. section 360bbb-3(b)(1), unless the authorization is terminated or revoked sooner. Performed at [facility], [address] [place], [place] [address]   Blood Culture (routine x 2)     Status: None (Preliminary result)   Collection Time: 07/11/19  4:22 PM   Specimen: BLOOD LEFT WRIST  Result Value Ref Range Status   Specimen Description BLOOD LEFT WRIST  Final   Special Requests   Final    BOTTLES DRAWN AEROBIC AND ANAEROBIC Blood Culture adequate volume   Culture   Final    NO GROWTH < 24 HOURS Performed at [facility], [address] [place], [place] [address]    Report Status PENDING  Incomplete  Blood Culture (routine x 2)     Status: None (Preliminary result)   Collection Time: 07/11/19  4:22 PM   Specimen: Right Antecubital; Blood  Result Value Ref Range Status   Specimen Description RIGHT ANTECUBITAL  Final   Special Requests   Final    BOTTLES DRAWN AEROBIC AND ANAEROBIC Blood Culture adequate volume   Culture   Final    NO GROWTH < 24 HOURS Performed at [facility], [address] [place], [place] [address]    Report Status PENDING  Incomplete     Time coordinating discharge: 35 minutes  SIGNED:   Erick Blinks, DO [facility]  If 7PM-7AM, please contact night-coverage [email or web address]

## 2019-07-12 NOTE — Plan of Care (Signed)
  Problem: Acute Rehab PT Goals(only PT should resolve) Goal: Pt Will Go Supine/Side To Sit Outcome: Progressing Flowsheets (Taken 07/12/2019 1435) Pt will go Supine/Side to Sit: with minimal assist Goal: Patient Will Transfer Sit To/From Stand Outcome: Progressing Flowsheets (Taken 07/12/2019 1435) Patient will transfer sit to/from stand: with minimal assist Goal: Pt Will Transfer Bed To Chair/Chair To Bed Outcome: Progressing Flowsheets (Taken 07/12/2019 1435) Pt will Transfer Bed to Chair/Chair to Bed: with min assist Goal: Pt Will Ambulate Outcome: Progressing Flowsheets (Taken 07/12/2019 1435) Pt will Ambulate:  25 feet  with minimal assist  with rolling walker  with moderate assist   2:35 PM, 07/12/19 Lonell Grandchild, MPT Physical Therapist with [facility] [phone] office [phone] mobile phone

## 2019-07-12 NOTE — Evaluation (Signed)
Physical Therapy Evaluation Patient Details Name: Jennifer Kerr MRN: [id number] DOB: 06-27-1932 Today's Date: 07/12/2019   History of Present Illness  Jennifer Kerr  is a 85 y.o. female, with history of CVA, type diabetes mellitus, hypertension, dementia was sent from the skilled facility for chief complaints of weakness and hypoxia.  Patient is a poor historian so history obtained from the ED notes.  EMS was called out at the facility, as per nursing facility patient's O2 sats was 70% range.  However when EMS arrived O2 sats were in the 90s without supplemental oxygen.  Patient denied chest pain or shortness of breath.As per patient's daughter [name] she was told by facility that patient has been more weak over the past 3 days.  Requires significant assistance with ambulation.  She had decreased oral intake.  She had an episode of emesis this morning which prompted them to check vital signs and she was found to be hypoxic.  Patient did not have any fever.  She was recently treated for UTI with Bactrim.  Patient is DNR as per the ED provider notes.    Clinical Impression  Patient very apprehensive and requires encouragement to participate with therapy.  Patient tends to resist movement requiring constant verbal/tacile cueing to sit up at bedside, transferred to chair using RW, but very unsteady on feet with near loss of balance, required stand pivot with knees blocked to get back into bed and SpO2 dropped below 80% with exertion while on room air.  Patient's SpO2 increased above 90% after put back on 4 LPM O2 - nursing staff aware and in room during visit.  Patient will benefit from continued physical therapy in hospital and recommended venue below to increase strength, balance, endurance for safe ADLs and gait.    Follow Up Recommendations SNF;Supervision for mobility/OOB;Supervision/Assistance - 24 hour    Equipment Recommendations  None recommended by PT    Recommendations for Other Services        Precautions / Restrictions Precautions Precautions: Fall Restrictions Weight Bearing Restrictions: No      Mobility  Bed Mobility Overal bed mobility: Needs Assistance Bed Mobility: Supine to Sit;Sit to Supine     Supine to sit: Max assist;Mod assist Sit to supine: Mod assist;Max assist   General bed mobility comments: labored movement, tendency to resist movement due to agitation  Transfers Overall transfer level: Needs assistance Equipment used: Rolling walker (2 wheeled);1 person hand held assist Transfers: Sit to/from [employer] Sit to Stand: Mod assist;Max assist Stand pivot transfers: Mod assist;Max assist       General transfer comment: required knees blocked due to BLE weakness and/or resisting movement  Ambulation/Gait Ambulation/Gait assistance: Max assist Gait Distance (Feet): 2 Feet Assistive device: Rolling walker (2 wheeled) Gait Pattern/deviations: Decreased step length - right;Decreased step length - left;Decreased stride length Gait velocity: slow   General Gait Details: limited to a 2-3 slow labored side steps with buckling of knees due to weakness  Stairs            Wheelchair Mobility    Modified Rankin (Stroke Patients Only)       Balance Overall balance assessment: Needs assistance Sitting-balance support: Bilateral upper extremity supported Sitting balance-Leahy Scale: Fair Sitting balance - Comments: seated at EOB   Standing balance support: During functional activity;Bilateral upper extremity supported Standing balance-Leahy Scale: Poor Standing balance comment: using RW  Pertinent Vitals/Pain Pain Assessment: Faces Faces Pain Scale: Hurts a little bit Pain Location: RUE at site of IV insertion Pain Descriptors / Indicators: Sore Pain Intervention(s): Limited activity within patient's tolerance;Monitored during session    Home Living Family/patient expects to be  discharged to:: Assisted living Living Arrangements: Other (Comment)(ALF staff)             Home Equipment: Gilford Rile - 2 wheels      Prior Function Level of Independence: Needs assistance   Gait / Transfers Assistance Needed: household ambulator using RW with ALF staff assisting  ADL's / Homemaking Assistance Needed: assisted by ALF staff        Hand Dominance        Extremity/Trunk Assessment   Upper Extremity Assessment Upper Extremity Assessment: Generalized weakness    Lower Extremity Assessment Lower Extremity Assessment: Generalized weakness    Cervical / Trunk Assessment Cervical / Trunk Assessment: Kyphotic  Communication   Communication: No difficulties  Cognition Arousal/Alertness: Awake/alert Behavior During Therapy: Agitated;Restless Overall Cognitive Status: History of cognitive impairments - at baseline                                        General Comments      Exercises     Assessment/Plan    PT Assessment Patient needs continued PT services  PT Problem List Decreased strength;Decreased activity tolerance;Decreased balance;Decreased mobility;Decreased cognition       PT Treatment Interventions Gait training;Functional mobility training;Therapeutic activities;Therapeutic exercise;Patient/family education    PT Goals (Current goals can be found in the Care Plan section)  Acute Rehab PT Goals Patient Stated Goal: not stated PT Goal Formulation: With patient Time For Goal Achievement: 07/26/19 Potential to Achieve Goals: Fair    Frequency Min 2X/week   Barriers to discharge        Co-evaluation               AM-PAC PT "6 Clicks" Mobility  Outcome Measure Help needed turning from your back to your side while in a flat bed without using bedrails?: A Lot Help needed moving from lying on your back to sitting on the side of a flat bed without using bedrails?: A Lot Help needed moving to and from a bed to a chair  (including a wheelchair)?: A Lot Help needed standing up from a chair using your arms (e.g., wheelchair or bedside chair)?: A Lot Help needed to walk in hospital room?: Total Help needed climbing 3-5 steps with a railing? : Total 6 Click Score: 10    End of Session   Activity Tolerance: Patient tolerated treatment well;Patient limited by fatigue Patient left: in bed;with call bell/phone within reach;with bed alarm set Nurse Communication: Mobility status PT Visit Diagnosis: Unsteadiness on feet (R26.81);Other abnormalities of gait and mobility (R26.89);Muscle weakness (generalized) (M62.81)    Time: 4097-3532 PT Time Calculation (min) (ACUTE ONLY): 24 min   Charges:   PT Evaluation $PT Eval Moderate Complexity: 1 Mod PT Treatments $Therapeutic Activity: 23-37 mins        2:34 PM, 07/12/19 Lonell Grandchild, MPT Physical Therapist with [facility] [phone] office [phone] mobile phone

## 2019-07-12 NOTE — Progress Notes (Signed)
SATURATION QUALIFICATIONS: (This note is used to comply with regulatory documentation for home oxygen)  Patient Saturations on Room Air at Rest = 86%  Patient Saturations on Room Air while Sitting = 82%  Patient Saturations on 2 Liters of oxygen while Sitting = 94%  Please briefly explain why patient needs home oxygen: Despite the patient being unable to ambulate at this time, patient unable to maintain oxygen saturation while on room air in a lying or sitting position.  Celestia Khat, RN

## 2019-07-12 NOTE — NC FL2 (Addendum)
[place] MEDICAID FL2 LEVEL OF CARE SCREENING TOOL     IDENTIFICATION  Patient Name: Jennifer Kerr Birthdate: Mar 18, 1932 Sex: female Admission Date (Current Location): 07/11/2019  [facility] and [place] Number:  [employer] and Address:  [place] [address] [place]      Provider Number: [id number]  Attending Physician Name and Address:  Rodena Goldmann, DO  Relative Name and Phone Number:  Leilani Able - daughter [phone]    Current Level of Care: Hospital Recommended Level of Care: [place] Prior Approval Number:    Date Approved/Denied:   PASRR Number:    Discharge Plan: Domiciliary (Rest home)    Current Diagnoses: Patient Active Problem List   Diagnosis Date Noted  . Acute respiratory failure with hypoxia ([place]) 07/11/2019  . Fracture of rib of right side 07/05/2011  . Diastolic dysfunction 62/83/1517  . Fall against object 07/02/2011  . UTI (lower urinary tract infection) 07/02/2011  . HTN (hypertension) 07/02/2011  . DM type 2 (diabetes mellitus, type 2) ([place]) 07/02/2011  . Dementia ([place]) 07/02/2011  . Back pain, acute 07/02/2011  . Thrombocytopenia ([place]) 07/02/2011  . Elevated LFTs 07/02/2011  . HYPOTHYROIDISM 08/27/2006  . ANXIETY 08/27/2006  . DEPRESSION 08/27/2006  . IBS 08/27/2006  . DIARRHEA 08/27/2006  . HYPERGLYCEMIA 08/27/2006    Orientation RESPIRATION BLADDER Height & Weight     Self  O2 Incontinent Weight: 83.9 kg Height:  5\' 8"  (172.7 cm)  BEHAVIORAL SYMPTOMS/MOOD NEUROLOGICAL BOWEL NUTRITION STATUS      Incontinent Diet(See Discharge Summary)  AMBULATORY STATUS COMMUNICATION OF NEEDS Skin   Total Care Verbally Normal                       Personal Care Assistance Level of Assistance  Bathing, Feeding, Dressing, Total care Bathing Assistance: Maximum assistance Feeding assistance: Limited assistance Dressing Assistance: Maximum assistance Total Care Assistance:  Maximum assistance   Functional Limitations Info  Sight, Hearing, Speech Sight Info: Adequate Hearing Info: Adequate Speech Info: Adequate    SPECIAL CARE FACTORS FREQUENCY                       Contractures Contractures Info: Not present    Additional Factors Info  Code Status, Allergies Code Status Info: DNR Allergies Info: NKDA           Current Medications (07/12/2019):  This is the current hospital active medication list Current Facility-Administered Medications  Medication Dose Route Frequency Provider Last Rate Last Dose  . acetaminophen (TYLENOL) tablet 500-1,000 mg  500-1,000 mg Oral Q8H PRN Oswald Hillock, MD      . aspirin EC tablet 81 mg  81 mg Oral q morning - 10a Oswald Hillock, MD   81 mg at 07/12/19 0907  . Chlorhexidine Gluconate Cloth 2 % PADS 6 each  6 each Topical Daily Heath Lark D, DO   6 each at 07/12/19 0907  . donepezil (ARICEPT) tablet 5 mg  5 mg Oral QHS Darrick Meigs, Marge Duncans, MD      . levothyroxine (SYNTHROID) tablet 25 mcg  25 mcg Oral QAC breakfast Oswald Hillock, MD   25 mcg at 07/12/19 0907  . loratadine (CLARITIN) tablet 10 mg  10 mg Oral Daily Oswald Hillock, MD   10 mg at 07/12/19 0907  . MEDLINE mouth rinse  15 mL Mouth Rinse BID Manuella Ghazi, Pratik D, DO   15 mL  at 07/12/19 0907  . ondansetron (ZOFRAN) tablet 4 mg  4 mg Oral Q6H PRN Meredeth Ide, MD       Or  . ondansetron [facility]) injection 4 mg  4 mg Intravenous Q6H PRN Meredeth Ide, MD      . sertraline (ZOLOFT) tablet 25 mg  25 mg Oral q morning - 10a Meredeth Ide, MD   25 mg at 07/12/19 0907  . sodium chloride 0.9 % bolus 500 mL  500 mL Intravenous Once Meredeth Ide, MD   Stopped at 07/11/19 1823     Discharge Medications:  Medication List        TAKE these medications       acetaminophen 500 MG tablet Commonly known as: TYLENOL Take 500-1,000 mg by mouth every 8 (eight) hours as needed for mild pain or moderate pain.   aspirin EC 81 MG tablet Take 81 mg by mouth every  morning.   cetirizine 10 MG tablet Commonly known as: ZYRTEC Take 10 mg by mouth at bedtime.   donepezil 5 MG tablet Commonly known as: ARICEPT Take 5 mg by mouth at bedtime.   levothyroxine 25 MCG tablet Commonly known as: SYNTHROID Take 25 mcg by mouth daily before breakfast.   lisinopril 10 MG tablet Commonly known as: ZESTRIL Take 10 mg by mouth every morning.   multivitamin with minerals Tabs tablet Take 1 tablet by mouth 2 (two) times daily.   sertraline 25 MG tablet Commonly known as: ZOLOFT Take 25 mg by mouth every morning.                    Durable Medical Equipment  (From admission, onward)                        Start     Ordered    07/12/19 1334  DME Oxygen  Once    Question Answer Comment  Length of Need Lifetime   Mode or (Route) Nasal cannula   Liters per Minute 2   Frequency Continuous (stationary and portable oxygen unit needed)   Oxygen conserving device No   Oxygen delivery system Gas      07/12/19 1333             Relevant Imaging Results:  Relevant Lab Results:   Additional Information SS # [id number]  Leitha Bleak, RN

## 2019-07-14 DIAGNOSIS — Z87440 Personal history of urinary (tract) infections: Secondary | ICD-10-CM | POA: Diagnosis not present

## 2019-07-14 DIAGNOSIS — Z9981 Dependence on supplemental oxygen: Secondary | ICD-10-CM | POA: Diagnosis not present

## 2019-07-14 DIAGNOSIS — F331 Major depressive disorder, recurrent, moderate: Secondary | ICD-10-CM | POA: Diagnosis not present

## 2019-07-14 DIAGNOSIS — J9601 Acute respiratory failure with hypoxia: Secondary | ICD-10-CM | POA: Diagnosis not present

## 2019-07-14 DIAGNOSIS — E039 Hypothyroidism, unspecified: Secondary | ICD-10-CM | POA: Diagnosis not present

## 2019-07-14 DIAGNOSIS — R630 Anorexia: Secondary | ICD-10-CM | POA: Diagnosis not present

## 2019-07-14 DIAGNOSIS — E119 Type 2 diabetes mellitus without complications: Secondary | ICD-10-CM | POA: Diagnosis not present

## 2019-07-14 DIAGNOSIS — I10 Essential (primary) hypertension: Secondary | ICD-10-CM | POA: Diagnosis not present

## 2019-07-14 DIAGNOSIS — F329 Major depressive disorder, single episode, unspecified: Secondary | ICD-10-CM | POA: Diagnosis not present

## 2019-07-14 DIAGNOSIS — Z7982 Long term (current) use of aspirin: Secondary | ICD-10-CM | POA: Diagnosis not present

## 2019-07-14 DIAGNOSIS — Z9181 History of falling: Secondary | ICD-10-CM | POA: Diagnosis not present

## 2019-07-14 DIAGNOSIS — K589 Irritable bowel syndrome without diarrhea: Secondary | ICD-10-CM | POA: Diagnosis not present

## 2019-07-14 DIAGNOSIS — G894 Chronic pain syndrome: Secondary | ICD-10-CM | POA: Diagnosis not present

## 2019-07-14 DIAGNOSIS — R0902 Hypoxemia: Secondary | ICD-10-CM | POA: Diagnosis not present

## 2019-07-14 DIAGNOSIS — I119 Hypertensive heart disease without heart failure: Secondary | ICD-10-CM | POA: Diagnosis not present

## 2019-07-14 DIAGNOSIS — F0280 Dementia in other diseases classified elsewhere without behavioral disturbance: Secondary | ICD-10-CM | POA: Diagnosis not present

## 2019-07-14 DIAGNOSIS — M6281 Muscle weakness (generalized): Secondary | ICD-10-CM | POA: Diagnosis not present

## 2019-07-14 DIAGNOSIS — F0150 Vascular dementia without behavioral disturbance: Secondary | ICD-10-CM | POA: Diagnosis not present

## 2019-07-14 LAB — URINE CULTURE: Culture: >=100000 — AB

## 2019-07-15 DIAGNOSIS — R52 Pain, unspecified: Secondary | ICD-10-CM | POA: Diagnosis not present

## 2019-07-15 DIAGNOSIS — S32592A Other specified fracture of left pubis, initial encounter for closed fracture: Secondary | ICD-10-CM | POA: Diagnosis not present

## 2019-07-15 DIAGNOSIS — S32512A Fracture of superior rim of left pubis, initial encounter for closed fracture: Secondary | ICD-10-CM | POA: Diagnosis not present

## 2019-07-15 DIAGNOSIS — N3001 Acute cystitis with hematuria: Secondary | ICD-10-CM | POA: Diagnosis not present

## 2019-07-15 DIAGNOSIS — R4182 Altered mental status, unspecified: Secondary | ICD-10-CM | POA: Diagnosis not present

## 2019-07-15 DIAGNOSIS — S32591A Other specified fracture of right pubis, initial encounter for closed fracture: Secondary | ICD-10-CM | POA: Diagnosis not present

## 2019-07-15 DIAGNOSIS — S32425A Nondisplaced fracture of posterior wall of left acetabulum, initial encounter for closed fracture: Secondary | ICD-10-CM | POA: Diagnosis not present

## 2019-07-15 DIAGNOSIS — R410 Disorientation, unspecified: Secondary | ICD-10-CM | POA: Diagnosis not present

## 2019-07-15 DIAGNOSIS — S32511A Fracture of superior rim of right pubis, initial encounter for closed fracture: Secondary | ICD-10-CM | POA: Diagnosis not present

## 2019-07-15 DIAGNOSIS — E869 Volume depletion, unspecified: Secondary | ICD-10-CM | POA: Diagnosis not present

## 2019-07-15 DIAGNOSIS — R404 Transient alteration of awareness: Secondary | ICD-10-CM | POA: Diagnosis not present

## 2019-07-15 DIAGNOSIS — N390 Urinary tract infection, site not specified: Secondary | ICD-10-CM | POA: Diagnosis not present

## 2019-07-15 DIAGNOSIS — R079 Chest pain, unspecified: Secondary | ICD-10-CM | POA: Diagnosis not present

## 2019-07-15 DIAGNOSIS — K5730 Diverticulosis of large intestine without perforation or abscess without bleeding: Secondary | ICD-10-CM | POA: Diagnosis not present

## 2019-07-15 DIAGNOSIS — I700 Atherosclerosis of aorta: Secondary | ICD-10-CM | POA: Diagnosis not present

## 2019-07-16 DIAGNOSIS — E039 Hypothyroidism, unspecified: Secondary | ICD-10-CM | POA: Diagnosis present

## 2019-07-16 DIAGNOSIS — S32511A Fracture of superior rim of right pubis, initial encounter for closed fracture: Secondary | ICD-10-CM | POA: Diagnosis present

## 2019-07-16 DIAGNOSIS — Z743 Need for continuous supervision: Secondary | ICD-10-CM | POA: Diagnosis not present

## 2019-07-16 DIAGNOSIS — R4182 Altered mental status, unspecified: Secondary | ICD-10-CM | POA: Diagnosis not present

## 2019-07-16 DIAGNOSIS — E869 Volume depletion, unspecified: Secondary | ICD-10-CM | POA: Diagnosis not present

## 2019-07-16 DIAGNOSIS — K589 Irritable bowel syndrome without diarrhea: Secondary | ICD-10-CM | POA: Diagnosis not present

## 2019-07-16 DIAGNOSIS — S32591A Other specified fracture of right pubis, initial encounter for closed fracture: Secondary | ICD-10-CM | POA: Diagnosis not present

## 2019-07-16 DIAGNOSIS — S72002A Fracture of unspecified part of neck of left femur, initial encounter for closed fracture: Secondary | ICD-10-CM | POA: Diagnosis not present

## 2019-07-16 DIAGNOSIS — Z8673 Personal history of transient ischemic attack (TIA), and cerebral infarction without residual deficits: Secondary | ICD-10-CM | POA: Diagnosis not present

## 2019-07-16 DIAGNOSIS — F339 Major depressive disorder, recurrent, unspecified: Secondary | ICD-10-CM | POA: Diagnosis not present

## 2019-07-16 DIAGNOSIS — S32512A Fracture of superior rim of left pubis, initial encounter for closed fracture: Secondary | ICD-10-CM | POA: Diagnosis not present

## 2019-07-16 DIAGNOSIS — R188 Other ascites: Secondary | ICD-10-CM | POA: Diagnosis not present

## 2019-07-16 DIAGNOSIS — E43 Unspecified severe protein-calorie malnutrition: Secondary | ICD-10-CM | POA: Diagnosis not present

## 2019-07-16 DIAGNOSIS — I679 Cerebrovascular disease, unspecified: Secondary | ICD-10-CM | POA: Diagnosis not present

## 2019-07-16 DIAGNOSIS — F329 Major depressive disorder, single episode, unspecified: Secondary | ICD-10-CM | POA: Diagnosis present

## 2019-07-16 DIAGNOSIS — G301 Alzheimer's disease with late onset: Secondary | ICD-10-CM | POA: Diagnosis not present

## 2019-07-16 DIAGNOSIS — M6281 Muscle weakness (generalized): Secondary | ICD-10-CM | POA: Diagnosis not present

## 2019-07-16 DIAGNOSIS — S32810B Multiple fractures of pelvis with stable disruption of pelvic ring, initial encounter for open fracture: Secondary | ICD-10-CM | POA: Diagnosis not present

## 2019-07-16 DIAGNOSIS — N3001 Acute cystitis with hematuria: Secondary | ICD-10-CM | POA: Diagnosis not present

## 2019-07-16 DIAGNOSIS — I10 Essential (primary) hypertension: Secondary | ICD-10-CM | POA: Diagnosis not present

## 2019-07-16 DIAGNOSIS — R2689 Other abnormalities of gait and mobility: Secondary | ICD-10-CM | POA: Diagnosis not present

## 2019-07-16 DIAGNOSIS — S32810D Multiple fractures of pelvis with stable disruption of pelvic ring, subsequent encounter for fracture with routine healing: Secondary | ICD-10-CM | POA: Diagnosis not present

## 2019-07-16 DIAGNOSIS — S32810A Multiple fractures of pelvis with stable disruption of pelvic ring, initial encounter for closed fracture: Secondary | ICD-10-CM | POA: Diagnosis not present

## 2019-07-16 DIAGNOSIS — S32402A Unspecified fracture of left acetabulum, initial encounter for closed fracture: Secondary | ICD-10-CM | POA: Diagnosis not present

## 2019-07-16 DIAGNOSIS — S3282XA Multiple fractures of pelvis without disruption of pelvic ring, initial encounter for closed fracture: Secondary | ICD-10-CM | POA: Diagnosis not present

## 2019-07-16 DIAGNOSIS — E034 Atrophy of thyroid (acquired): Secondary | ICD-10-CM | POA: Diagnosis not present

## 2019-07-16 DIAGNOSIS — E46 Unspecified protein-calorie malnutrition: Secondary | ICD-10-CM | POA: Diagnosis not present

## 2019-07-16 DIAGNOSIS — S32592A Other specified fracture of left pubis, initial encounter for closed fracture: Secondary | ICD-10-CM | POA: Diagnosis not present

## 2019-07-16 DIAGNOSIS — E119 Type 2 diabetes mellitus without complications: Secondary | ICD-10-CM | POA: Diagnosis present

## 2019-07-16 DIAGNOSIS — Z20828 Contact with and (suspected) exposure to other viral communicable diseases: Secondary | ICD-10-CM | POA: Diagnosis not present

## 2019-07-16 DIAGNOSIS — J984 Other disorders of lung: Secondary | ICD-10-CM | POA: Diagnosis not present

## 2019-07-16 DIAGNOSIS — R1311 Dysphagia, oral phase: Secondary | ICD-10-CM | POA: Diagnosis not present

## 2019-07-16 DIAGNOSIS — F411 Generalized anxiety disorder: Secondary | ICD-10-CM | POA: Diagnosis present

## 2019-07-16 DIAGNOSIS — S32425A Nondisplaced fracture of posterior wall of left acetabulum, initial encounter for closed fracture: Secondary | ICD-10-CM | POA: Diagnosis not present

## 2019-07-16 DIAGNOSIS — J90 Pleural effusion, not elsewhere classified: Secondary | ICD-10-CM | POA: Diagnosis not present

## 2019-07-16 DIAGNOSIS — Y998 Other external cause status: Secondary | ICD-10-CM | POA: Diagnosis not present

## 2019-07-16 DIAGNOSIS — S3210XA Unspecified fracture of sacrum, initial encounter for closed fracture: Secondary | ICD-10-CM | POA: Diagnosis not present

## 2019-07-16 DIAGNOSIS — G309 Alzheimer's disease, unspecified: Secondary | ICD-10-CM | POA: Diagnosis present

## 2019-07-16 DIAGNOSIS — I700 Atherosclerosis of aorta: Secondary | ICD-10-CM | POA: Diagnosis not present

## 2019-07-16 DIAGNOSIS — F0280 Dementia in other diseases classified elsewhere without behavioral disturbance: Secondary | ICD-10-CM | POA: Diagnosis not present

## 2019-07-16 DIAGNOSIS — S32402G Unspecified fracture of left acetabulum, subsequent encounter for fracture with delayed healing: Secondary | ICD-10-CM | POA: Diagnosis not present

## 2019-07-16 DIAGNOSIS — S32415A Nondisplaced fracture of anterior wall of left acetabulum, initial encounter for closed fracture: Secondary | ICD-10-CM | POA: Diagnosis not present

## 2019-07-16 DIAGNOSIS — Z79899 Other long term (current) drug therapy: Secondary | ICD-10-CM | POA: Diagnosis not present

## 2019-07-16 DIAGNOSIS — S32119A Unspecified Zone I fracture of sacrum, initial encounter for closed fracture: Secondary | ICD-10-CM | POA: Diagnosis not present

## 2019-07-16 DIAGNOSIS — Z9181 History of falling: Secondary | ICD-10-CM | POA: Diagnosis not present

## 2019-07-16 DIAGNOSIS — J9811 Atelectasis: Secondary | ICD-10-CM | POA: Diagnosis not present

## 2019-07-16 DIAGNOSIS — S329XXA Fracture of unspecified parts of lumbosacral spine and pelvis, initial encounter for closed fracture: Secondary | ICD-10-CM | POA: Diagnosis not present

## 2019-07-16 DIAGNOSIS — T80319A ABO incompatibility with hemolytic transfusion reaction, unspecified, initial encounter: Secondary | ICD-10-CM | POA: Diagnosis not present

## 2019-07-16 DIAGNOSIS — D696 Thrombocytopenia, unspecified: Secondary | ICD-10-CM | POA: Diagnosis not present

## 2019-07-16 DIAGNOSIS — D62 Acute posthemorrhagic anemia: Secondary | ICD-10-CM | POA: Diagnosis not present

## 2019-07-16 DIAGNOSIS — R279 Unspecified lack of coordination: Secondary | ICD-10-CM | POA: Diagnosis not present

## 2019-07-16 DIAGNOSIS — S32502A Unspecified fracture of left pubis, initial encounter for closed fracture: Secondary | ICD-10-CM | POA: Diagnosis not present

## 2019-07-16 DIAGNOSIS — R0902 Hypoxemia: Secondary | ICD-10-CM | POA: Diagnosis not present

## 2019-07-16 DIAGNOSIS — X58XXXA Exposure to other specified factors, initial encounter: Secondary | ICD-10-CM | POA: Diagnosis not present

## 2019-07-16 DIAGNOSIS — G9341 Metabolic encephalopathy: Secondary | ICD-10-CM | POA: Diagnosis not present

## 2019-07-16 DIAGNOSIS — M549 Dorsalgia, unspecified: Secondary | ICD-10-CM | POA: Diagnosis not present

## 2019-07-16 DIAGNOSIS — Z79890 Hormone replacement therapy (postmenopausal): Secondary | ICD-10-CM | POA: Diagnosis not present

## 2019-07-16 DIAGNOSIS — E1169 Type 2 diabetes mellitus with other specified complication: Secondary | ICD-10-CM | POA: Diagnosis not present

## 2019-07-16 DIAGNOSIS — Z7982 Long term (current) use of aspirin: Secondary | ICD-10-CM | POA: Diagnosis not present

## 2019-07-16 DIAGNOSIS — Z043 Encounter for examination and observation following other accident: Secondary | ICD-10-CM | POA: Diagnosis not present

## 2019-07-16 DIAGNOSIS — Z6823 Body mass index (BMI) 23.0-23.9, adult: Secondary | ICD-10-CM | POA: Diagnosis not present

## 2019-07-16 LAB — CULTURE, BLOOD (ROUTINE X 2)
Culture: NO GROWTH
Culture: NO GROWTH
Special Requests: ADEQUATE
Special Requests: ADEQUATE

## 2019-07-21 DIAGNOSIS — R1311 Dysphagia, oral phase: Secondary | ICD-10-CM | POA: Diagnosis not present

## 2019-07-21 DIAGNOSIS — F339 Major depressive disorder, recurrent, unspecified: Secondary | ICD-10-CM | POA: Diagnosis not present

## 2019-07-21 DIAGNOSIS — R634 Abnormal weight loss: Secondary | ICD-10-CM | POA: Diagnosis not present

## 2019-07-21 DIAGNOSIS — R279 Unspecified lack of coordination: Secondary | ICD-10-CM | POA: Diagnosis not present

## 2019-07-21 DIAGNOSIS — S32810D Multiple fractures of pelvis with stable disruption of pelvic ring, subsequent encounter for fracture with routine healing: Secondary | ICD-10-CM | POA: Diagnosis not present

## 2019-07-21 DIAGNOSIS — R0902 Hypoxemia: Secondary | ICD-10-CM | POA: Diagnosis not present

## 2019-07-21 DIAGNOSIS — E1169 Type 2 diabetes mellitus with other specified complication: Secondary | ICD-10-CM | POA: Diagnosis not present

## 2019-07-21 DIAGNOSIS — F0280 Dementia in other diseases classified elsewhere without behavioral disturbance: Secondary | ICD-10-CM | POA: Diagnosis not present

## 2019-07-21 DIAGNOSIS — J069 Acute upper respiratory infection, unspecified: Secondary | ICD-10-CM | POA: Diagnosis not present

## 2019-07-21 DIAGNOSIS — S32810S Multiple fractures of pelvis with stable disruption of pelvic ring, sequela: Secondary | ICD-10-CM | POA: Diagnosis not present

## 2019-07-21 DIAGNOSIS — Z9181 History of falling: Secondary | ICD-10-CM | POA: Diagnosis not present

## 2019-07-21 DIAGNOSIS — I679 Cerebrovascular disease, unspecified: Secondary | ICD-10-CM | POA: Diagnosis not present

## 2019-07-21 DIAGNOSIS — E034 Atrophy of thyroid (acquired): Secondary | ICD-10-CM | POA: Diagnosis not present

## 2019-07-21 DIAGNOSIS — S32402G Unspecified fracture of left acetabulum, subsequent encounter for fracture with delayed healing: Secondary | ICD-10-CM | POA: Diagnosis not present

## 2019-07-21 DIAGNOSIS — G309 Alzheimer's disease, unspecified: Secondary | ICD-10-CM | POA: Diagnosis not present

## 2019-07-21 DIAGNOSIS — R7989 Other specified abnormal findings of blood chemistry: Secondary | ICD-10-CM | POA: Diagnosis not present

## 2019-07-21 DIAGNOSIS — R4182 Altered mental status, unspecified: Secondary | ICD-10-CM | POA: Diagnosis not present

## 2019-07-21 DIAGNOSIS — I10 Essential (primary) hypertension: Secondary | ICD-10-CM | POA: Diagnosis not present

## 2019-07-21 DIAGNOSIS — M6281 Muscle weakness (generalized): Secondary | ICD-10-CM | POA: Diagnosis not present

## 2019-07-21 DIAGNOSIS — D696 Thrombocytopenia, unspecified: Secondary | ICD-10-CM | POA: Diagnosis not present

## 2019-07-21 DIAGNOSIS — Z743 Need for continuous supervision: Secondary | ICD-10-CM | POA: Diagnosis not present

## 2019-07-21 DIAGNOSIS — K589 Irritable bowel syndrome without diarrhea: Secondary | ICD-10-CM | POA: Diagnosis not present

## 2019-07-21 DIAGNOSIS — R630 Anorexia: Secondary | ICD-10-CM | POA: Diagnosis not present

## 2019-07-21 DIAGNOSIS — Z7982 Long term (current) use of aspirin: Secondary | ICD-10-CM | POA: Diagnosis not present

## 2019-07-21 DIAGNOSIS — R234 Changes in skin texture: Secondary | ICD-10-CM | POA: Diagnosis not present

## 2019-07-22 ENCOUNTER — Non-Acute Institutional Stay (SKILLED_NURSING_FACILITY): Payer: Medicare Other | Admitting: Adult Health

## 2019-07-22 DIAGNOSIS — E034 Atrophy of thyroid (acquired): Secondary | ICD-10-CM

## 2019-07-22 DIAGNOSIS — F339 Major depressive disorder, recurrent, unspecified: Secondary | ICD-10-CM | POA: Diagnosis not present

## 2019-07-22 DIAGNOSIS — E1169 Type 2 diabetes mellitus with other specified complication: Secondary | ICD-10-CM | POA: Diagnosis not present

## 2019-07-22 DIAGNOSIS — I10 Essential (primary) hypertension: Secondary | ICD-10-CM

## 2019-07-22 DIAGNOSIS — S32810S Multiple fractures of pelvis with stable disruption of pelvic ring, sequela: Secondary | ICD-10-CM

## 2019-07-22 DIAGNOSIS — D696 Thrombocytopenia, unspecified: Secondary | ICD-10-CM | POA: Diagnosis not present

## 2019-07-22 DIAGNOSIS — S3282XA Multiple fractures of pelvis without disruption of pelvic ring, initial encounter for closed fracture: Secondary | ICD-10-CM | POA: Insufficient documentation

## 2019-07-22 NOTE — Progress Notes (Addendum)
Location:  [facility] Room Number: 301/B Place of Service:  SNF (31) Provider:  Kenard Gower, DNP, FNP-BC  Patient Care Team: Benita Stabile, MD as PCP - General (Internal Medicine)  Extended Emergency Contact Information Primary Emergency Contact: [name],[name] Address: [address]          [place], [place] [address] [place] of [place] Home Phone: [phone] Mobile Phone: [phone] Relation: Daughter  Code Status:  DNR  Goals of care: Advanced Directive information Advanced Directives 07/22/2019  Does Patient Have a Medical Advance Directive? Yes  Type of Advance Directive Out of facility DNR (pink MOST or yellow form)  Copy of Healthcare Power of Attorney in Chart? -  Would patient like information on creating a medical advance directive? -  Pre-existing out of facility DNR order (yellow form or pink MOST form) -     Chief Complaint  Patient presents with  . Hospitalization Follow-up    Hospitalization Follow Up    HPI:  Pt is a 85 y.o. female who has been admitted to [facility] on 07/21/19 post hospitalization 10/23 to 07/21/19.  She has PMH of Alzheimer's disease, CVA, depression, malnutrition, hypertension, diabetes mellitus and hypothyroidism.  She was a resident of SNF and complained of right hip pain. Staff denies any trauma or falls. She was transferred to [facility] where she was noted to have bilateral pelvic fracture and left acetabular fracture. She was then transferred to [facility] for further management. Imaging showed a pelvic ring injury and left acetabular fracture and possible T4-T5 compression fractures. Serial abdominal exams and serial complete blood counts were done due to simple pelvic fluid seen on imaging which remained stable and without any signs of peritonitis.  Neurosurgery consulted for possible T1-T5 compression fractures.  No T/L precautions she remains in a hard collar prior NSU team  until she was able to be cleared clinically.-Collar cleared prior to discharge.  No spine precautions per NSG and does not require follow-up.  Orthopedic trauma consulted for her pelvic ring and left acetabular fractures.  Fractures were managed nonoperatively.  Weightbearing as tolerated.  She will continue aspirin 81 mg twice a day for 6 weeks for DVT prophylaxis.  She will follow-up with orthopedics as outpatient.  Low intensity therapy with OT and PT was determined to be beneficial for the patient.  Covid test negative x2 prior to discharge.  She is not required to follow-up with trauma.   Past Medical History:  Diagnosis Date  . Cerebrovascular disease   . Dementia (HCC)   . Depression   . DM type 2 (diabetes mellitus, type 2) (HCC) 07/02/2011  . Elevated LFTs   . Frequent falls   . HTN (hypertension) 07/02/2011  . Hypertension   . Thrombocytopenia (HCC)   . UTI (lower urinary tract infection)   . UTI (urinary tract infection)    Past Surgical History:  Procedure Laterality Date  . ABDOMINAL HYSTERECTOMY    . CHOLECYSTECTOMY      No Known Allergies  Outpatient Encounter Medications as of 07/22/2019  Medication Sig  . acetaminophen (TYLENOL) 500 MG tablet Take 1,000 mg by mouth every 8 (eight) hours as needed. For Pain  . acetaminophen (TYLENOL) 500 MG tablet Take 500 mg by mouth every 6 (six) hours as needed for mild pain or moderate pain.  Marland Kitchen aspirin EC 81 MG tablet Take 81 mg by mouth every morning.   . bisacodyl (DULCOLAX) 10 MG suppository If not relieved by MOM,  give 10 mg Bisacodyl suppositiory rectally X 1 dose in 24 hours as needed (Do not use constipation standing orders for residents with renal failure/CFR less than 30. Contact MD for orders) (Physician Order)  . cetirizine (ZYRTEC) 10 MG tablet Take 10 mg by mouth at bedtime.  . donepezil (ARICEPT) 5 MG tablet Take 5 mg by mouth at bedtime.  Marland Kitchen. levothyroxine (SYNTHROID, LEVOTHROID) 25 MCG tablet Take 25 mcg by mouth  daily before breakfast.  . lisinopril (ZESTRIL) 10 MG tablet Take 10 mg by mouth every morning.   . magnesium hydroxide (MILK OF MAGNESIA) 400 MG/5ML suspension If no BM in 3 days, give 30 cc Milk of Magnesium p.o. x 1 dose in 24 hours as needed (Do not use standing constipation orders for residents with renal failure CFR less than 30. Contact MD for orders) (Physician Order)  . Multiple Vitamin (MULTIVITAMIN WITH MINERALS) TABS tablet Take 1 tablet by mouth 2 (two) times daily.  . NON FORMULARY Med Pass 120 ml po bid for supplement  . NON FORMULARY Mech soft diet  . polyethylene glycol (MIRALAX / GLYCOLAX) 17 g packet Take 17 g by mouth daily.  . sertraline (ZOLOFT) 25 MG tablet Take 25 mg by mouth every morning.  . Sodium Phosphates (RA SALINE ENEMA RE) If not relieved by Biscodyl suppository, give disposable Saline Enema rectally X 1 dose/24 hrs as needed (Do not use constipation standing orders for residents with renal failure/CFR less than 30. Contact MD for orders)(Physician Or   No facility-administered encounter medications on file as of 07/22/2019.     Review of Systems  Unable to obtain due to alzheimer's dementia.    Immunization History  Administered Date(s) Administered  . Influenza, High Dose Seasonal PF 06/29/2019  . Pneumococcal Polysaccharide-23 07/03/2011  . Tdap 10/22/2012   Pertinent  Health Maintenance Due  Topic Date Due  . FOOT EXAM  01/05/1944  . OPHTHALMOLOGY EXAM  01/05/1944  . DEXA SCAN  01/05/1999  . HEMOGLOBIN A1C  12/31/2011  . PNA vac Low Risk Adult (2 of 2 - PCV13) 07/02/2012  . INFLUENZA VACCINE  Completed   No flowsheet data found.   Vitals:   07/22/19 0938  BP: (!) 102/50  Pulse: 60  Resp: 15  Temp: (!) 97.2 F (36.2 C)  TempSrc: Oral  SpO2: 94%  Weight: 147 lb 6.4 oz (66.9 kg)  Height: 5\' 3"  (1.6 m)   Body mass index is 26.11 kg/m.  Physical Exam  GENERAL APPEARANCE: Well nourished. In no acute distress. Normal body habitus  SKIN:  Right heel pressure ulcer stage, sloughing blister with small amount of serous drainage; Left heel pink. MOUTH and THROAT: Lips are without lesions. Oral mucosa is moist and without lesions.  RESPIRATORY: Breathing is even & unlabored, BS CTAB CARDIAC: RRR, no murmur,no extra heart sounds, no edema GI: Abdomen soft, normal BS, no masses, no tenderness EXTREMITIES:  Able to move BUE NEUROLOGICAL: There is no tremor. Speech is clear. Alert to self, disoriented to time and place. PSYCHIATRIC:  Affect and behavior are appropriate  Labs reviewed: Recent Labs    07/11/19 1347 07/12/19 0520  NA 138 141  K 4.0 4.1  CL 105 113*  CO2 22 22  GLUCOSE 232* 150*  BUN 22 23  CREATININE 1.13* 0.81  CALCIUM 9.0 8.1*   Recent Labs    07/11/19 1347 07/12/19 0520  AST 39 25  ALT 31 22  ALKPHOS 139* 95  BILITOT 2.2* 1.2  PROT 6.1* 4.9*  ALBUMIN 3.5 2.8*   Recent Labs    07/11/19 1347 07/12/19 0520  WBC 9.6 5.2  NEUTROABS 8.8*  --   HGB 12.7 9.9*  HCT 39.3 30.8*  MCV 97.8 98.7  PLT 69* 40*   Lab Results  Component Value Date   TSH 2.780 07/02/2011   Lab Results  Component Value Date   HGBA1C 7.5 (H) 07/02/2011   No results found for: CHOL, HDL, LDLCALC, LDLDIRECT, TRIG, CHOLHDL  Significant Diagnostic Results in last 30 days:  Ct Head Wo Contrast  Result Date: 07/11/2019 CLINICAL DATA:  Altered mental status. Unexplained hypoxia. EXAM: CT HEAD WITHOUT CONTRAST TECHNIQUE: Contiguous axial images were obtained from the base of the skull through the vertex without intravenous contrast. COMPARISON:  10/02/2015. FINDINGS: Brain: Stable moderately enlarged ventricles and subarachnoid spaces. Stable mild patchy white matter low density in both cerebral hemispheres. No intracranial hemorrhage, mass lesion or CT evidence of acute infarction. Vascular: No hyperdense vessel or unexpected calcification. Skull: Normal. Negative for fracture or focal lesion. Sinuses/Orbits: No acute  finding. Other: Deviation of the midportion of the nasal septum to the right and mild left concha bullosa. IMPRESSION: 1. No acute abnormality. 2. Stable moderate diffuse cerebral and cerebellar atrophy and mild chronic small vessel white matter ischemic changes in both cerebral hemispheres. Electronically Signed   By: Claudie Revering M.D.   On: 07/11/2019 18:25   Ct Angio Chest Pe W Or Wo Contrast  Result Date: 07/11/2019 CLINICAL DATA:  Shortness of breath. Weakness. EXAM: CT ANGIOGRAPHY CHEST WITH CONTRAST TECHNIQUE: Multidetector CT imaging of the chest was performed using the standard protocol during bolus administration of intravenous contrast. Multiplanar CT image reconstructions and MIPs were obtained to evaluate the vascular anatomy. CONTRAST:  115mL OMNIPAQUE IOHEXOL 350 MG/ML SOLN COMPARISON:  Radiograph earlier this day. Chest CTA 02/05/2015 FINDINGS: Cardiovascular: Evaluation limited by breathing motion artifact. Allowing for this, there are no filling defects within the pulmonary arteries to suggest pulmonary embolus. Atherosclerosis of the thoracic aorta without dissection. No evidence of acute aortic syndrome. Branch vessels are tortuous. Left vertebral artery arises directly from the thoracic aorta, normal variant arch anatomy. Mild cardiomegaly. No pericardial effusion. Mitral annulus calcifications. There are coronary artery calcifications. Mediastinum/Nodes: Small right infrahilar nodes, nonspecific and not enlarged by size criteria. No enlarged mediastinal lymph nodes. No suspicious thyroid nodule. Decompressed esophagus. Lungs/Pleura: Central bronchial thickening, most prominent in the lower lobes. Symmetric dependent lung base opacities consistent with atelectasis. No evidence of pneumonia or pulmonary edema. No significant pleural fluid. No pulmonary mass. Upper Abdomen: No acute finding. Postcholecystectomy. Musculoskeletal: Exaggerated thoracic kyphosis. Chronic mild loss of height of T5  vertebra. Degenerative change throughout the spine. There are no acute or suspicious osseous abnormalities. Review of the MIP images confirms the above findings. IMPRESSION: 1. No pulmonary embolus or aortic dissection. 2. Central bronchial thickening, most prominent in the lower lobes, can be seen with bronchitis or reactive airways disease. 3. Mild dependent atelectasis.  No evidence of pneumonia. 4. Coronary artery calcifications. Aortic Atherosclerosis (ICD10-I70.0). Electronically Signed   By: Keith Rake M.D.   On: 07/11/2019 22:17   Dg Chest Port 1 View  Result Date: 07/11/2019 CLINICAL DATA:  Weakness. Hypoxia. EXAM: PORTABLE CHEST 1 VIEW COMPARISON:  02/05/2015 FINDINGS: The heart size and pulmonary vascularity are normal the lungs are clear. No effusions. No appreciable bone abnormality. IMPRESSION: No active disease. Electronically Signed   By: Lorriane Shire M.D.   On: 07/11/2019 13:18    Assessment/Plan  1. Multiple closed fractures of pelvis with stable disruption of pelvic ring, sequela -  Imaging showed a pelvic ring injury and left acetabular fracture, orthopedic trauma consulted, fractures were managed nonoperatively. Weightbearing as tolerated. She will continue aspirin 81 mg twice a day for 6 weeks for DVT prophylaxis.  She will follow-up with orthopedics as outpatient -Continue acetaminophen 500 mg 1-2 tabs Q 8 hours PRN for pain  2. Type 2 diabetes mellitus with other specified complication, without long-term current use of insulin (HCC) -Not on any medications   3. Hypothyroidism due to acquired atrophy of thyroid -Continue Synthroid 25 mcg 1 tab daily  4. Essential hypertension -Stable continue lisinopril 10 mg 1 tab daily  5. Depression, recurrent (HCC) -Continue Zoloft 25 mg 1 tab daily  6. Thrombocytopenia (HCC) Lab Results  Component Value Date   PLT 40 (L) 07/12/2019  -Monitor for bruising/bleeding    Family/ staff Communication: Discussed plan of  care with resident and charge nurse.  Labs/tests ordered: CBC, TSH, A1c   Goals of care:   Short-term care   Kenard Gower, DNP, FNP-BC [facility] and Adult Medicine [phone] (Monday-Friday 8:00 a.m. - 5:00 p.m.) [phone] (after hours)

## 2019-07-26 ENCOUNTER — Encounter: Payer: Self-pay | Admitting: Internal Medicine

## 2019-07-26 ENCOUNTER — Non-Acute Institutional Stay (SKILLED_NURSING_FACILITY): Payer: Medicare Other | Admitting: Internal Medicine

## 2019-07-26 DIAGNOSIS — G309 Alzheimer's disease, unspecified: Secondary | ICD-10-CM

## 2019-07-26 DIAGNOSIS — S32810S Multiple fractures of pelvis with stable disruption of pelvic ring, sequela: Secondary | ICD-10-CM | POA: Diagnosis not present

## 2019-07-26 DIAGNOSIS — F0280 Dementia in other diseases classified elsewhere without behavioral disturbance: Secondary | ICD-10-CM | POA: Diagnosis not present

## 2019-07-26 DIAGNOSIS — S32402G Unspecified fracture of left acetabulum, subsequent encounter for fracture with delayed healing: Secondary | ICD-10-CM

## 2019-07-26 DIAGNOSIS — S32402A Unspecified fracture of left acetabulum, initial encounter for closed fracture: Secondary | ICD-10-CM | POA: Insufficient documentation

## 2019-07-26 DIAGNOSIS — R7989 Other specified abnormal findings of blood chemistry: Secondary | ICD-10-CM | POA: Diagnosis not present

## 2019-07-26 LAB — CBC AND DIFFERENTIAL
HCT: 27 — AB (ref 36–46)
Hemoglobin: 9.7 — AB (ref 12.0–16.0)
Platelets: 144 — AB (ref 150–399)
WBC: 3.4

## 2019-07-26 LAB — HEMOGLOBIN A1C: Hemoglobin A1C: 4.3

## 2019-07-26 LAB — CBC: RBC: 2.82 — AB (ref 3.87–5.11)

## 2019-07-26 LAB — TSH: TSH: 8.29 — AB (ref 0.41–5.90)

## 2019-07-26 NOTE — Patient Instructions (Addendum)
See assessment and plan under each diagnosis in the problem list and acutely for this visit 

## 2019-07-26 NOTE — Assessment & Plan Note (Signed)
PT/OT at SNF. WBAT.

## 2019-07-26 NOTE — Assessment & Plan Note (Signed)
WBAT DVT prophylaxis with aspirin 81 mg twice daily x6 weeks and 81 mg daily thereafter beginning 09/03/2019.

## 2019-07-26 NOTE — Assessment & Plan Note (Addendum)
Current LFTs WNL Tylenol dose decreased to 325 mg 2 pills every 6 hours as needed to prevent potential hepatotoxicity.

## 2019-07-26 NOTE — Progress Notes (Signed)
NURSING HOME LOCATION:  [facility] ROOM NUMBER:  301-B  CODE STATUS:  DNR  PCP:  Benita Stabile, MD  [address] [place] [address]   This is a comprehensive admission note to [facility] performed on this date less than 30 days from date of admission. Included are preadmission medical/surgical history; reconciled medication list; family history; social history and comprehensive review of systems.  Corrections and additions to the records were documented. Comprehensive physical exam was also performed. Additionally a clinical summary was entered for each active diagnosis pertinent to this admission in the Problem List to enhance continuity of care.  HPI: Patient was hospitalized 10/24-10/29/2020 presenting as a level 2 trauma activation after sustaining injuries in unwitnessed fall.  History was obtained from EMS and referring hospital documentation.  The patient was at an SNF when she complained of right hip pain; staff was unaware of any trauma or falls.  She was transported to [facility] where she was noted to have bilateral pelvic fractures and left acetabular fracture.  At this point she was transferred to [facility] for trauma management.  Upon arrival she was Glascow coma scale 11 and hemodynamically stable.  Patient has a diagnosis of Alzheimer's disease and was a unable to provide any history related to the fall or past medical history. Imaging revealed pelvic ring injury and left acetabular fracture was as well as possible T4-T5 compression fractures.  Trauma team managed aggressive pulmonary toilet and pain control.  Serial imaging revealed simple pelvic fluid which remained stable with no clinical evidence of peritonitis. NS consulted for the T4-T5 compression fractures; no spine precautions were recommended. Orthopedics managed the pelvic ring injuries and left acetabular fractures nonoperatively.  Weightbearing as tolerated was recommended. At discharge Lovenox  was discontinued.  Also Norco 5/325 was discontinued with 2 separate doses of Tylenol ordered.   NOTE:Significantly were she to receive both potential maximal doses; potentially hepatotoxicity could be a risk.She does have a history of elevated LFTs.   Aspirin 81 mg twice daily for 6 weeks was prescribed for DVT prophylaxis.  Subsequent to that she would be on 81 mg of aspirin daily. PT/OT were involved in her care and recommended low intensity therapy at SNF.  Past medical and surgical history: Obtained from the old chart includes thrombocytopenia, elevated LFTs, depression, CVA, depression, caloric/protein malnutrition, essential hypertension, diabetes, and hypothyroidism. Surgeries include abdominal hysterectomy and cholecystectomy.  Social history: Never smoked or drank alcohol.  Family history:reviewed   Review of systems:  Could not be completed due to dementia.  She was unable to give me the month or the year.  She did name the president however.  She kept repeating "I just don't feel good".  When I ask if this meant she was tired she replied yes.  The answer to all other queries for review of systems was negative.  She typically would shout the word "no" as she answered the question.  She specifically denied any pain in her hip or pelvic area.  Her nurse had stated the patient seemed to be in pain earlier.  Constitutional: No fever, significant weight change Eyes: No redness, discharge, pain, vision change ENT/mouth: No nasal congestion, purulent discharge, earache, change in hearing, sore throat  Cardiovascular: No chest pain, palpitations, paroxysmal nocturnal dyspnea, claudication, edema  Respiratory: No cough, sputum production, hemoptysis, DOE, significant snoring, apnea Gastrointestinal: No heartburn, dysphagia, abdominal pain, nausea /vomiting, rectal bleeding, melena, change in bowels Genitourinary: No dysuria, hematuria, pyuria, incontinence,  nocturia Musculoskeletal: No joint  stiffness, joint swelling, weakness, pain Dermatologic: No rash, pruritus, change in appearance of skin Neurologic: No dizziness, headache, syncope, seizures, numbness, tingling Psychiatric: No significant anxiety, depression, insomnia, anorexia Endocrine: No change in hair/skin/nails, excessive thirst, excessive hunger, excessive urination  Hematologic/lymphatic: No significant bruising, lymphadenopathy, abnormal bleeding  Physical exam:  Pertinent or positive findings: She appears her stated age.  She is hard of hearing.  She tended to keep her eyes closed and her face turned toward the wall.  Intermittently she tried to cover her face with the bed linens.  She has bilateral ptosis.  Occasionally she would seemingly glare at the examiner.  She is edentulous.  Breath sounds are decreased.  Heart sounds are distant; clinically the heart rhythm is regular.  She has 1/2+ edema at the sock line.  Pedal pulses are decreased.  She has fusiform changes of the knees with slight ballotability suggesting possible patellar effusion.  She would not oppose my hand to test strength.  She had difficulty following commands.  When I asked her to raise 2 fingers on the left hand she raised 4 fingers on the right.  She tended to keep her right upper extremity cradled around a stuffed animal.  She has bruising of the forearms.  General appearance:  no acute distress, increased work of breathing is present.   Lymphatic: No lymphadenopathy about the head, neck, axilla. Eyes: No conjunctival inflammation or lid edema is present. There is no scleral icterus. Ears:  External ear exam shows no significant lesions or deformities.   Nose:  External nasal examination shows no deformity or inflammation. Nasal mucosa are pink and moist without lesions, exudates Oral exam: Lips and gums are healthy appearing.There is no oropharyngeal erythema or exudate. Neck:  No thyromegaly, masses, tenderness noted.    Heart:  No gallop,  murmur, click, rub.  Lungs:  without wheezes, rhonchi, rales, rubs. Abdomen: Bowel sounds are normal.  Abdomen is soft and nontender with no organomegaly, hernias, masses. GU: Deferred  Extremities:  No cyanosis, clubbing Neurologic exam:  Balance, Rhomberg, finger to nose testing could not be completed due to clinical state Skin: Warm & dry w/o tenting. No significant rash.  See clinical summary under each active problem in the Problem List with associated updated therapeutic plan

## 2019-07-28 NOTE — Assessment & Plan Note (Signed)
No behavioral issues here Psych NP consult if needed

## 2019-08-01 ENCOUNTER — Encounter: Payer: Self-pay | Admitting: Adult Health

## 2019-08-01 ENCOUNTER — Non-Acute Institutional Stay (SKILLED_NURSING_FACILITY): Payer: Medicare Other | Admitting: Adult Health

## 2019-08-01 DIAGNOSIS — R234 Changes in skin texture: Secondary | ICD-10-CM | POA: Diagnosis not present

## 2019-08-01 DIAGNOSIS — E034 Atrophy of thyroid (acquired): Secondary | ICD-10-CM

## 2019-08-01 DIAGNOSIS — R630 Anorexia: Secondary | ICD-10-CM | POA: Diagnosis not present

## 2019-08-01 NOTE — Progress Notes (Signed)
Location:  [facility] Room Number: 301/B Place of Service:  SNF (31) Provider:  Durenda Age, DNP, FNP-BC  Patient Care Team: Celene Squibb, MD as PCP - General (Internal Medicine)  Extended Emergency Contact Information Primary Emergency Contact: [name],[name] Address: [address]          [place], [place] [address] Johnnette Litter of [place] Phone: [phone] Mobile Phone: [phone] Relation: Daughter  Code Status:  DNR  Goals of care: Advanced Directive information Advanced Directives 08/01/2019  Does Patient Have a Medical Advance Directive? Yes  Type of Advance Directive Out of facility DNR (pink MOST or yellow form)  Does patient want to make changes to medical advance directive? No - Patient declined  Copy of [place] in Chart? -  Would patient like information on creating a medical advance directive? -  Pre-existing out of facility DNR order (yellow form or pink MOST form) -     Chief Complaint  Patient presents with  . Acute Visit    Routine weekly follow up hospitalization visit     HPI:  Pt is a 85 y.o. female seen for a routine weekly visit post hospitalization. She was reported to have poor appetite and had a weight loss of 5.49% in 7 days . Daughter reported that she used to be on Megace which has helped her appetite. Charge nurse reported that she refused AM medications. Review of labs, done on 07/26/19, showed TSH 8.29, elevated.  She is currently taking levothyroxine daily.  She has been admitted to [facility] on 07/21/19 post hospitalization  To 07/21/19. She has PMH of Alzheimer's disease, CVA, depression, moderate prophylaxis, hypertension, diabetes mellitus and hypothyroidism.  She was a resident of SNF and complained of right hip pain.  Denies any trauma or falls.  She was transferred to [facility] where she was noted to have bilateral pelvic fracture and left acetabular  fracture.  She was then transferred to [facility] for further management.  Imaging showed a pelvic fracture ring injury and left acetabular fracturen and possible T4-T5 compression fractures. Serial abdominal exams and serial complete blood counts were done due to simple pelvic fluid seen on imaging which remained stable and without any signs of peritonitis.  Neurosurgery consulted for possible T1-T5 compression fractures. No T/L precautions. Neck collar was cleared prior to discharge. No spine precautions per NSU and does not require follow-up.  Orthopedic trauma consulted for her pelvic ring and acetabular fractures. Fractures were managed nonoperatively. WBAT. DVT prophylaxis, ASA 81 mg BID X 6 weeks   Past Medical History:  Diagnosis Date  . Cerebrovascular disease   . Dementia ([place])   . Depression   . DM type 2 (diabetes mellitus, type 2) ([place]) 07/02/2011  . Elevated LFTs   . Frequent falls   . HTN (hypertension) 07/02/2011  . Hypertension   . Thrombocytopenia ([place])   . UTI (lower urinary tract infection)    Past Surgical History:  Procedure Laterality Date  . ABDOMINAL HYSTERECTOMY    . CHOLECYSTECTOMY      No Known Allergies  Outpatient Encounter Medications as of 08/01/2019  Medication Sig  . acetaminophen (TYLENOL) 325 MG tablet Take 650 mg by mouth every 6 (six) hours as needed.  Marland Kitchen aspirin EC 81 MG tablet Take 81 mg by mouth 2 (two) times daily.   . bisacodyl (DULCOLAX) 10 MG suppository If not relieved by MOM, give 10 mg Bisacodyl suppositiory rectally X 1 dose in 24  hours as needed (Do not use constipation standing orders for residents with renal failure/CFR less than 30. Contact MD for orders) (Physician Order)  . donepezil (ARICEPT) 5 MG tablet Take 5 mg by mouth at bedtime.   Marland Kitchen levothyroxine (SYNTHROID, LEVOTHROID) 25 MCG tablet Take 25 mcg by mouth daily before breakfast.   . lisinopril (ZESTRIL) 10 MG tablet Take 10 mg by mouth every morning.   . loratadine (CLARITIN)  10 MG tablet Take 10 mg by mouth daily.  . magnesium hydroxide (MILK OF MAGNESIA) 400 MG/5ML suspension If no BM in 3 days, give 30 cc Milk of Magnesium p.o. x 1 dose in 24 hours as needed (Do not use standing constipation orders for residents with renal failure CFR less than 30. Contact MD for orders) (Physician Order)  . Multiple Vitamin (MULTIVITAMIN WITH MINERALS) TABS tablet Take 1 tablet by mouth 2 (two) times daily.  . NON FORMULARY Mech soft diet  . Nutritional Supplement LIQD Take 120 mLs by mouth 2 (two) times daily.  . sertraline (ZOLOFT) 25 MG tablet Take 25 mg by mouth every morning.  . Sodium Phosphates (RA SALINE ENEMA RE) If not relieved by Biscodyl suppository, give disposable Saline Enema rectally X 1 dose/24 hrs as needed (Do not use constipation standing orders for residents with renal failure/CFR less than 30. Contact MD for orders)(Physician Or  . traMADol (ULTRAM) 50 MG tablet Take by mouth every 6 (six) hours as needed for severe pain.  . [DISCONTINUED] acetaminophen (TYLENOL) 500 MG tablet Take 1,000 mg by mouth every 8 (eight) hours as needed. For Pain  . [DISCONTINUED] acetaminophen (TYLENOL) 500 MG tablet Take 500 mg by mouth every 8 (eight) hours as needed for mild pain or moderate pain.   . [DISCONTINUED] cetirizine (ZYRTEC) 10 MG tablet Take 10 mg by mouth at bedtime.  . [DISCONTINUED] polyethylene glycol (MIRALAX / GLYCOLAX) 17 g packet Take 17 g by mouth daily.   No facility-administered encounter medications on file as of 08/01/2019.     Review of Systems Unable to obtain due to alzheimer's dementia    Immunization History  Administered Date(s) Administered  . Influenza, High Dose Seasonal PF 06/29/2019  . Pneumococcal Polysaccharide-23 07/03/2011  . Tdap 10/22/2012   Pertinent  Health Maintenance Due  Topic Date Due  . FOOT EXAM  01/05/1942  . OPHTHALMOLOGY EXAM  01/05/1942  . DEXA SCAN  01/05/1999  . HEMOGLOBIN A1C  12/31/2011  . PNA vac Low Risk  Adult (2 of 2 - PCV13) 07/02/2012  . INFLUENZA VACCINE  Completed   No flowsheet data found.   Vitals:   08/01/19 1641  BP: (!) 152/60  Pulse: 74  Resp: 18  Temp: 98.8 F (37.1 C)  TempSrc: Oral  SpO2: 94%  Weight: 145 lb 12.8 oz (66.1 kg)  Height:  (1.6 m)   Body mass index is 25.83 kg/m.  Physical Exam  GENERAL APPEARANCE: Well nourished. In no acute distress. Normal body habitus SKIN:  Rashes/dry scabs on chest MOUTH and THROAT: Lips are without lesions. Oral mucosa is moist and without lesions. Tongue is normal in shape, size, and color and without lesions RESPIRATORY: Breathing is even & unlabored, BS CTAB CARDIAC: RRR, no murmur,no extra heart sounds, no edema GI: Abdomen soft, normal BS, no masses, no tenderness NEUROLOGICAL: There is no tremor. Speech is clear. Alert to self, disoriented to time and place. PSYCHIATRIC:  Affect and behavior are appropriate  Labs reviewed:  07/26/2019 WBC 0.4 hemoglobin 9.7 hematocrit 27.3  MCV 96.8 platelet 144 TSH 8.29 hemoglobin A1c 4.3 Recent Labs    07/11/19 1347 07/12/19 0520  NA 138 141  K 4.0 4.1  CL 105 113*  CO2 22 22  GLUCOSE 232* 150*  BUN 22 23  CREATININE 1.13* 0.81  CALCIUM 9.0 8.1*   Recent Labs    07/11/19 1347 07/12/19 0520  AST 39 25  ALT 31 22  ALKPHOS 139* 95  BILITOT 2.2* 1.2  PROT 6.1* 4.9*  ALBUMIN 3.5 2.8*   Recent Labs    07/11/19 1347 07/12/19 0520  WBC 9.6 5.2  NEUTROABS 8.8*  --   HGB 12.7 9.9*  HCT 39.3 30.8*  MCV 97.8 98.7  PLT 69* 40*   Lab Results  Component Value Date   TSH 2.780 07/02/2011   Lab Results  Component Value Date   HGBA1C 7.5 (H) 07/02/2011    Significant Diagnostic Results in last 30 days:  Ct Head Wo Contrast  Result Date: 07/11/2019 CLINICAL DATA:  Altered mental status. Unexplained hypoxia. EXAM: CT HEAD WITHOUT CONTRAST TECHNIQUE: Contiguous axial images were obtained from the base of the skull through the vertex without intravenous  contrast. COMPARISON:  10/02/2015. FINDINGS: Brain: Stable moderately enlarged ventricles and subarachnoid spaces. Stable mild patchy white matter low density in both cerebral hemispheres. No intracranial hemorrhage, mass lesion or CT evidence of acute infarction. Vascular: No hyperdense vessel or unexpected calcification. Skull: Normal. Negative for fracture or focal lesion. Sinuses/Orbits: No acute finding. Other: Deviation of the midportion of the nasal septum to the right and mild left concha bullosa. IMPRESSION: 1. No acute abnormality. 2. Stable moderate diffuse cerebral and cerebellar atrophy and mild chronic small vessel white matter ischemic changes in both cerebral hemispheres. Electronically Signed   By: Beckie Salts M.D.   On: 07/11/2019 18:25   Ct Angio Chest Pe W Or Wo Contrast  Result Date: 07/11/2019 CLINICAL DATA:  Shortness of breath. Weakness. EXAM: CT ANGIOGRAPHY CHEST WITH CONTRAST TECHNIQUE: Multidetector CT imaging of the chest was performed using the standard protocol during bolus administration of intravenous contrast. Multiplanar CT image reconstructions and MIPs were obtained to evaluate the vascular anatomy. CONTRAST:  OMNIPAQUE IOHEXOL 350 MG/ML SOLN COMPARISON:  Radiograph earlier this day. Chest CTA 02/05/2015 FINDINGS: Cardiovascular: Evaluation limited by breathing motion artifact. Allowing for this, there are no filling defects within the pulmonary arteries to suggest pulmonary embolus. Atherosclerosis of the thoracic aorta without dissection. No evidence of acute aortic syndrome. Branch vessels are tortuous. Left vertebral artery arises directly from the thoracic aorta, normal variant arch anatomy. Mild cardiomegaly. No pericardial effusion. Mitral annulus calcifications. There are coronary artery calcifications. Mediastinum/Nodes: Small right infrahilar nodes, nonspecific and not enlarged by size criteria. No enlarged mediastinal lymph nodes. No suspicious thyroid  nodule. Decompressed esophagus. Lungs/Pleura: Central bronchial thickening, most prominent in the lower lobes. Symmetric dependent lung base opacities consistent with atelectasis. No evidence of pneumonia or pulmonary edema. No significant pleural fluid. No pulmonary mass. Upper Abdomen: No acute finding. Postcholecystectomy. Musculoskeletal: Exaggerated thoracic kyphosis. Chronic mild loss of height of T5 vertebra. Degenerative change throughout the spine. There are no acute or suspicious osseous abnormalities. Review of the MIP images confirms the above findings. IMPRESSION: 1. No pulmonary embolus or aortic dissection. 2. Central bronchial thickening, most prominent in the lower lobes, can be seen with bronchitis or reactive airways disease. 3. Mild dependent atelectasis.  No evidence of pneumonia. 4. Coronary artery calcifications. Aortic Atherosclerosis (ICD10-I70.0). Electronically Signed   By: Narda Rutherford  M.D.   On: 07/11/2019 22:17   Dg Chest Port 1 View  Result Date: 07/11/2019 CLINICAL DATA:  Weakness. Hypoxia. EXAM: PORTABLE CHEST 1 VIEW COMPARISON:  02/05/2015 FINDINGS: The heart size and pulmonary vascularity are normal the lungs are clear. No effusions. No appreciable bone abnormality. IMPRESSION: No active disease. Electronically Signed   By: Francene BoyersJames  Maxwell M.D.   On: 07/11/2019 13:18    Assessment/Plan  1. Poor appetite -  Will start on Remeron 15 mg  Q HS for appetite stimulant, continue multivitamins with minerals 1 tab daily, med Pass 120 mL twice a day  2. Scabs on chest -  Will apply Triple antibiotic ointment BID X 7 days, monitor skin for infection  3. Hypothyroidism due to acquired atrophy of thyroid - tsh 8.29 (07/26/2019), elevated, will increase levothyroxine from 25mcg to 50 mcg daily   Family/ staff Communication:  Discussed plan of care with resident and charge nurse.  Labs/tests ordered:  tsh in 6 weeks  Goals of care:  Short-term care    Kenard GowerMonina  Medina-Vargas, DNP, FNP-BC [facility] [phone] (Monday-Friday 8:00 a.m. - 5:00 p.m.) [phone] (after hours)

## 2019-08-04 DIAGNOSIS — R234 Changes in skin texture: Secondary | ICD-10-CM | POA: Insufficient documentation

## 2019-08-04 DIAGNOSIS — R630 Anorexia: Secondary | ICD-10-CM | POA: Insufficient documentation

## 2019-08-15 ENCOUNTER — Encounter: Payer: Self-pay | Admitting: Adult Health

## 2019-08-15 ENCOUNTER — Non-Acute Institutional Stay (SKILLED_NURSING_FACILITY): Payer: Medicare Other | Admitting: Adult Health

## 2019-08-15 DIAGNOSIS — F339 Major depressive disorder, recurrent, unspecified: Secondary | ICD-10-CM | POA: Diagnosis not present

## 2019-08-15 DIAGNOSIS — R634 Abnormal weight loss: Secondary | ICD-10-CM

## 2019-08-15 NOTE — Progress Notes (Signed)
Location:  [facility] Room Number: 107/A Place of Service:  SNF (31) Provider:  Kenard Gower, DNP, FNP-BC  Patient Care Team: Benita Stabile, MD as PCP - General (Internal Medicine)  Extended Emergency Contact Information Primary Emergency Contact: [name],[name] Address: [address]          [place], [place] [address] Darden Amber of [place] Home Phone: [phone] Mobile Phone: [phone] Relation: Daughter  Code Status:  DNR  Goals of care: Advanced Directive information Advanced Directives 08/15/2019  Does Patient Have a Medical Advance Directive? Yes  Type of Advance Directive Out of facility DNR (pink MOST or yellow form)  Does patient want to make changes to medical advance directive? No - Patient declined  Copy of Healthcare Power of Attorney in Chart? -  Would patient like information on creating a medical advance directive? -  Pre-existing out of facility DNR order (yellow form or pink MOST form) -     Chief Complaint  Patient presents with  . Acute Visit    Depression    HPI:  Pt is a 85 y.o. female who was reported to have crying spells. She was wanting to "go home to her mom and dad". She is currently on Sertraline 25 mg daily for depression. She had weight loss of 3.77% in 9 days and 9.05% in 16 days. she has PMH of Alzheimer's disease, CVA, depression, hypertension, diabetes mellitus and hypothyroidism.  Past Medical History:  Diagnosis Date  . Cerebrovascular disease   . Dementia (HCC)   . Depression   . DM type 2 (diabetes mellitus, type 2) (HCC) 07/02/2011  . Elevated LFTs   . Frequent falls   . HTN (hypertension) 07/02/2011  . Hypertension   . Thrombocytopenia (HCC)   . UTI (lower urinary tract infection)    Past Surgical History:  Procedure Laterality Date  . ABDOMINAL HYSTERECTOMY    . CHOLECYSTECTOMY      No Known Allergies  Outpatient Encounter Medications as of 08/15/2019  Medication Sig  .  acetaminophen (TYLENOL) 325 MG tablet Take 650 mg by mouth every 6 (six) hours as needed.  Marland Kitchen aspirin EC 81 MG tablet Take 81 mg by mouth 2 (two) times daily.   . bisacodyl (DULCOLAX) 10 MG suppository If not relieved by MOM, give 10 mg Bisacodyl suppositiory rectally X 1 dose in 24 hours as needed (Do not use constipation standing orders for residents with renal failure/CFR less than 30. Contact MD for orders) (Physician Order)  . donepezil (ARICEPT) 5 MG tablet Take 5 mg by mouth at bedtime.   Marland Kitchen levothyroxine (SYNTHROID, LEVOTHROID) 25 MCG tablet Take 50 mcg by mouth daily before breakfast.   . lisinopril (ZESTRIL) 10 MG tablet Take 10 mg by mouth every morning.   . loratadine (CLARITIN) 10 MG tablet Take 10 mg by mouth daily.  . magnesium hydroxide (MILK OF MAGNESIA) 400 MG/5ML suspension If no BM in 3 days, give 30 cc Milk of Magnesium p.o. x 1 dose in 24 hours as needed (Do not use standing constipation orders for residents with renal failure CFR less than 30. Contact MD for orders) (Physician Order)  . mirtazapine (REMERON) 15 MG tablet Take 15 mg by mouth at bedtime.  . Multiple Vitamin (MULTIVITAMIN WITH MINERALS) TABS tablet Take 1 tablet by mouth 2 (two) times daily.  . NON FORMULARY Mech soft diet  . NON FORMULARY Magic cup twice daily to prevent weight loss  . Nutritional Supplement LIQD Med Pass 120 ml  po bid for supplement  . sertraline (ZOLOFT) 25 MG tablet Take 25 mg by mouth every morning.  . Sodium Phosphates (RA SALINE ENEMA RE) If not relieved by Biscodyl suppository, give disposable Saline Enema rectally X 1 dose/24 hrs as needed (Do not use constipation standing orders for residents with renal failure/CFR less than 30. Contact MD for orders)(Physician Or  . traMADol (ULTRAM) 50 MG tablet Take by mouth every 6 (six) hours as needed for severe pain.   No facility-administered encounter medications on file as of 08/15/2019.     Review of Systems unable to obtain due to  Alzheimer's dementia   Immunization History  Administered Date(s) Administered  . Influenza, High Dose Seasonal PF 06/29/2019  . Pneumococcal Polysaccharide-23 07/03/2011  . Tdap 10/22/2012   Pertinent  Health Maintenance Due  Topic Date Due  . FOOT EXAM  01/05/1942  . OPHTHALMOLOGY EXAM  01/05/1942  . DEXA SCAN  01/05/1999  . HEMOGLOBIN A1C  12/31/2011  . PNA vac Low Risk Adult (2 of 2 - PCV13) 07/02/2012  . INFLUENZA VACCINE  Completed   Fall Risk  04/20/2019  Falls in the past year? (No Data)  Comment Emmi Telephone Survey: data to providers prior to load  Number falls in past yr: (No Data)  Comment Emmi Telephone Survey Actual Response =      Vitals:   08/15/19 0956  BP: 111/68  Pulse: 72  Resp: 16  Temp: (!) 96.5 F (35.8 C)  TempSrc: Oral  SpO2: 97%  Weight: 132 lb 9.6 oz (60.1 kg)  Height: 5\' 3"  (1.6 m)   Body mass index is 23.49 kg/m.  Physical Exam  GENERAL APPEARANCE: Well nourished. In no acute distress. Normal body habitus SKIN:   Scabs on chest MOUTH and THROAT: Lips are without lesions. Oral mucosa is moist and without lesions.  RESPIRATORY: Breathing is even & unlabored, BS CTAB CARDIAC: RRR, no murmur,no extra heart sounds, no edema GI: Abdomen soft, normal BS, no masses, no tenderness EXTREMITIES:  Able to move BUE NEUROLOGICAL: There is no tremor. Speech is clear  PSYCHIATRIC:  Affect is sad.  Labs reviewed: Recent Labs    07/11/19 1347 07/12/19 0520  NA 138 141  K 4.0 4.1  CL 105 113*  CO2 22 22  GLUCOSE 232* 150*  BUN 22 23  CREATININE 1.13* 0.81  CALCIUM 9.0 8.1*   Recent Labs    07/11/19 1347 07/12/19 0520  AST 39 25  ALT 31 22  ALKPHOS 139* 95  BILITOT 2.2* 1.2  PROT 6.1* 4.9*  ALBUMIN 3.5 2.8*   Recent Labs    07/11/19 1347 07/12/19 0520  WBC 9.6 5.2  NEUTROABS 8.8*  --   HGB 12.7 9.9*  HCT 39.3 30.8*  MCV 97.8 98.7  PLT 69* 40*   Lab Results  Component Value Date   TSH 2.780 07/02/2011   Lab Results   Component Value Date   HGBA1C 7.5 (H) 07/02/2011     Assessment/Plan  1. Depression, recurrent ([place]) - will increase Sertraline from 25 mg to 50 mg daily  2. Weight loss - weight loss of 3.77% in 9 days and 9.05% in 16 days. - will add magic cup BID -Continue multivitamins 1 tab daily and mirtazapine 15 mg 1 tab at bedtime for poor appetite    Family/ staff Communication: Discussed plan of care with charge nurse.  Labs/tests ordered: None  Goals of care:  Short-term care   Durenda Age, DNP, FNP-BC [facility] [phone] (Monday-Friday 8:00 a.m. - 5:00 p.m.) [phone] (after hours)

## 2019-08-24 DIAGNOSIS — R634 Abnormal weight loss: Secondary | ICD-10-CM | POA: Insufficient documentation

## 2019-08-25 ENCOUNTER — Encounter: Payer: Self-pay | Admitting: Adult Health

## 2019-08-25 ENCOUNTER — Non-Acute Institutional Stay (SKILLED_NURSING_FACILITY): Payer: Medicare Other | Admitting: Adult Health

## 2019-08-25 DIAGNOSIS — G309 Alzheimer's disease, unspecified: Secondary | ICD-10-CM | POA: Diagnosis not present

## 2019-08-25 DIAGNOSIS — S32402G Unspecified fracture of left acetabulum, subsequent encounter for fracture with delayed healing: Secondary | ICD-10-CM

## 2019-08-25 DIAGNOSIS — R630 Anorexia: Secondary | ICD-10-CM

## 2019-08-25 DIAGNOSIS — F339 Major depressive disorder, recurrent, unspecified: Secondary | ICD-10-CM | POA: Diagnosis not present

## 2019-08-25 DIAGNOSIS — I10 Essential (primary) hypertension: Secondary | ICD-10-CM | POA: Diagnosis not present

## 2019-08-25 DIAGNOSIS — E034 Atrophy of thyroid (acquired): Secondary | ICD-10-CM | POA: Diagnosis not present

## 2019-08-25 DIAGNOSIS — F0280 Dementia in other diseases classified elsewhere without behavioral disturbance: Secondary | ICD-10-CM

## 2019-08-25 NOTE — Progress Notes (Signed)
Location:  [facility] Room Number: 107/A Place of Service:  SNF (31) Provider:  Kenard Gower, DNP, FNP-BC  Patient Care Team: Benita Stabile, MD as PCP - General (Internal Medicine)  Extended Emergency Contact Information Primary Emergency Contact: [name],[name] Address: [address]          [place], [place] [address] Jennifer Kerr of [place] Home Phone: [phone] Mobile Phone: [phone] Relation: Daughter  Code Status:  DNR  Goals of care: Advanced Directive information Advanced Directives 08/25/2019  Does Patient Have a Medical Advance Directive? Yes  Type of Advance Directive Out of facility DNR (pink MOST or yellow form)  Does patient want to make changes to medical advance directive? No - Patient declined  Copy of Healthcare Power of Attorney in Chart? -  Would patient like information on creating a medical advance directive? -  Pre-existing out of facility DNR order (yellow form or pink MOST form) -     Chief Complaint  Patient presents with  . Medical Management of Chronic Issues    Routine visit of medical management    HPI:  Pt is a 85 y.o. female seen today for medical management of chronic diseases. She has PMH of Alzheimer's disease, CVA, depression, hypertension, diabetes mellitus and hypothyroidism. Sertraline dosage was recently increased from 25 mg to 50 mg daily for depression. Latest weight  132.6 lbs, stable.  BPs 121/64, 113/55, 121/55, 119/75, 118/50. She takes Lisinopril 10 mg daily for hypertension. Her tsh was 8.29 (07/26/19) and Levothyroxine dosage was increased to 50 mcg daily.   Past Medical History:  Diagnosis Date  . Cerebrovascular disease   . Dementia (HCC)   . Depression   . DM type 2 (diabetes mellitus, type 2) (HCC) 07/02/2011  . Elevated LFTs   . Frequent falls   . HTN (hypertension) 07/02/2011  . Hypertension   . Thrombocytopenia (HCC)   . UTI (lower urinary tract infection)    Past Surgical  History:  Procedure Laterality Date  . ABDOMINAL HYSTERECTOMY    . CHOLECYSTECTOMY      No Known Allergies  Outpatient Encounter Medications as of 08/25/2019  Medication Sig  . acetaminophen (TYLENOL) 325 MG tablet Take 650 mg by mouth every 6 (six) hours as needed.  Marland Kitchen aspirin EC 81 MG tablet Take 81 mg by mouth 2 (two) times daily.   . bisacodyl (DULCOLAX) 10 MG suppository If not relieved by MOM, give 10 mg Bisacodyl suppositiory rectally X 1 dose in 24 hours as needed (Do not use constipation standing orders for residents with renal failure/CFR less than 30. Contact MD for orders) (Physician Order)  . donepezil (ARICEPT) 5 MG tablet Take 5 mg by mouth at bedtime.   Marland Kitchen levothyroxine (SYNTHROID, LEVOTHROID) 25 MCG tablet Take 50 mcg by mouth daily before breakfast.   . lisinopril (ZESTRIL) 10 MG tablet Take 10 mg by mouth every morning.   . loratadine (CLARITIN) 10 MG tablet Take 10 mg by mouth daily.  . magnesium hydroxide (MILK OF MAGNESIA) 400 MG/5ML suspension If no BM in 3 days, give 30 cc Milk of Magnesium p.o. x 1 dose in 24 hours as needed (Do not use standing constipation orders for residents with renal failure CFR less than 30. Contact MD for orders) (Physician Order)  . mirtazapine (REMERON) 15 MG tablet Take 15 mg by mouth at bedtime.  . Multiple Vitamin (MULTIVITAMIN WITH MINERALS) TABS tablet Take 1 tablet by mouth 2 (two) times daily.  . NON FORMULARY Mech  soft diet  . NON FORMULARY Magic cup twice daily to prevent weight loss  . Nutritional Supplement LIQD Med Pass 120 ml po bid for supplement  . sertraline (ZOLOFT) 25 MG tablet Take 50 mg by mouth every morning. For Depression  . Sodium Phosphates (RA SALINE ENEMA RE) If not relieved by Biscodyl suppository, give disposable Saline Enema rectally X 1 dose/24 hrs as needed (Do not use constipation standing orders for residents with renal failure/CFR less than 30. Contact MD for orders)(Physician Or  . traMADol (ULTRAM) 50 MG  tablet Take by mouth every 6 (six) hours as needed for severe pain.   No facility-administered encounter medications on file as of 08/25/2019.     Review of Systems unable to obtain due to Alzheimer's dementia   Immunization History  Administered Date(s) Administered  . Influenza, High Dose Seasonal PF 06/29/2019  . Pneumococcal Polysaccharide-23 07/03/2011  . Tdap 10/22/2012   Pertinent  Health Maintenance Due  Topic Date Due  . FOOT EXAM  01/05/1942  . OPHTHALMOLOGY EXAM  01/05/1942  . DEXA SCAN  01/05/1999  . HEMOGLOBIN A1C  12/31/2011  . PNA vac Low Risk Adult (2 of 2 - PCV13) 07/02/2012  . INFLUENZA VACCINE  Completed   Fall Risk  04/20/2019  Falls in the past year? (No Data)  Comment Emmi Telephone Survey: data to providers prior to load  Number falls in past yr: (No Data)  Comment Emmi Telephone Survey Actual Response =      Vitals:   08/25/19 1113  BP: 121/64  Pulse: 75  Resp: 18  Temp: 98 F (36.7 C)  TempSrc: Oral  SpO2: 97%  Weight: 132 lb 9.6 oz (60.1 kg)  Height: 5\' 3"  (1.6 m)   Body mass index is 23.49 kg/m.  Physical Exam  GENERAL APPEARANCE: Well nourished. In no acute distress. Normal body habitus SKIN:  Scabs on chest healing, improved MOUTH and THROAT: Lips are without lesions. Oral mucosa is moist and without lesions.  RESPIRATORY: Breathing is even & unlabored, BS CTAB CARDIAC: RRR, no murmur,no extra heart sounds, no edema GI: Abdomen soft, normal BS, no masses, no tenderness NEUROLOGICAL: There is no tremor. Speech is clear. Confused X 3. PSYCHIATRIC:  Affect and behavior are appropriate  Labs reviewed:  07/26/2019 WBC 3.4 hemoglobin 9.7 hematocrit 27.3 platelet 144 TSH 8.29 A1c 4.3 Recent Labs    07/11/19 1347 07/12/19 0520  NA 138 141  K 4.0 4.1  CL 105 113*  CO2 22 22  GLUCOSE 232* 150*  BUN 22 23  CREATININE 1.13* 0.81  CALCIUM 9.0 8.1*   Recent Labs    07/11/19 1347 07/12/19 0520  AST 39 25  ALT 31 22  ALKPHOS  139* 95  BILITOT 2.2* 1.2  PROT 6.1* 4.9*  ALBUMIN 3.5 2.8*   Recent Labs    07/11/19 1347 07/12/19 0520  WBC 9.6 5.2  NEUTROABS 8.8*  --   HGB 12.7 9.9*  HCT 39.3 30.8*  MCV 97.8 98.7  PLT 69* 40*   Lab Results  Component Value Date   TSH 2.780 07/02/2011   Lab Results  Component Value Date   HGBA1C 7.5 (H) 07/02/2011    Assessment/Plan  1. Closed nondisplaced fracture of left acetabulum with delayed healing, unspecified portion of acetabulum, subsequent encounter - and multiple closed fractures of pelvis, continue PT and OT, continue tramadol as needed and aspirin for DVT prophylaxis  2. Poor appetite Wt Readings from Last 3 Encounters:  08/25/19 132 lb 9.6  oz (60.1 kg)  08/15/19 132 lb 9.6 oz (60.1 kg)  08/01/19 145 lb 12.8 oz (66.1 kg)  - weight is stable, continue mirtazapine 15 mg 1 tab at bedtime  3. Essential hypertension -Well-controlled, continue lisinopril 10 mg 1 tab daily  4. Depression, recurrent ([place]) - mood is stable, continue sertraline 50 mg 1 tab daily  5. Hypothyroidism due to acquired atrophy of thyroid -Levothyroxine was recently increased to 50 mcg 1 tab daily  6. Alzheimer's dementia without behavioral disturbance, unspecified timing of dementia onset ([place]) -Continue donepezil 5 mg 1 tab at bedtime, supportive care and fall precautions   Family/ staff Communication:  Discussed plan of care with charge nurse.  Labs/tests ordered:  None  Goals of care:   Short-term care   Durenda Age, DNP, FNP-BC [facility] [phone] (Monday-Friday 8:00 a.m. - 5:00 p.m.) [phone] (after hours)

## 2019-09-02 DIAGNOSIS — S32810D Multiple fractures of pelvis with stable disruption of pelvic ring, subsequent encounter for fracture with routine healing: Secondary | ICD-10-CM | POA: Diagnosis not present

## 2019-09-02 DIAGNOSIS — Z4789 Encounter for other orthopedic aftercare: Secondary | ICD-10-CM | POA: Diagnosis not present

## 2019-09-14 ENCOUNTER — Encounter: Payer: Self-pay | Admitting: Adult Health

## 2019-09-14 ENCOUNTER — Non-Acute Institutional Stay (SKILLED_NURSING_FACILITY): Payer: Medicare Other | Admitting: Adult Health

## 2019-09-14 DIAGNOSIS — I10 Essential (primary) hypertension: Secondary | ICD-10-CM

## 2019-09-14 DIAGNOSIS — S32402D Unspecified fracture of left acetabulum, subsequent encounter for fracture with routine healing: Secondary | ICD-10-CM

## 2019-09-14 DIAGNOSIS — E034 Atrophy of thyroid (acquired): Secondary | ICD-10-CM | POA: Diagnosis not present

## 2019-09-14 DIAGNOSIS — L89612 Pressure ulcer of right heel, stage 2: Secondary | ICD-10-CM | POA: Diagnosis not present

## 2019-09-14 DIAGNOSIS — F339 Major depressive disorder, recurrent, unspecified: Secondary | ICD-10-CM

## 2019-09-14 DIAGNOSIS — F0280 Dementia in other diseases classified elsewhere without behavioral disturbance: Secondary | ICD-10-CM

## 2019-09-14 DIAGNOSIS — G309 Alzheimer's disease, unspecified: Secondary | ICD-10-CM

## 2019-09-14 NOTE — Progress Notes (Signed)
Location:  [facility] Room Number: 109/A Place of Service:  SNF (31) Provider:  Kenard Gower, DNP, FNP-BC  Patient Care Team: Benita Stabile, MD as PCP - General (Internal Medicine)  Extended Emergency Contact Information Primary Emergency Contact: [name],[name] Address: [address]          [place], [place] [address] Darden Amber of [place] Home Phone: [phone] Mobile Phone: [phone] Relation: Daughter  Code Status:  DNR  Goals of care: Advanced Directive information Advanced Directives 09/14/2019  Does Patient Have a Medical Advance Directive? Yes  Type of Advance Directive Out of facility DNR (pink MOST or yellow form)  Does patient want to make changes to medical advance directive? No - Patient declined  Copy of Healthcare Power of Attorney in Chart? -  Would patient like information on creating a medical advance directive? -  Pre-existing out of facility DNR order (yellow form or pink MOST form) Yellow form placed in chart (order not valid for inpatient use)     Chief Complaint  Patient presents with  . Medical Management of Chronic Issues    Routine visit of medical management    HPI:  Pt is a 85 y.o. female seen today for medical management of chronic diseases.  She has PMH of Alzheimer's disease, CVA, depression, hypertension, diabetes mellitus and hypothyroidism. She is currently having PT, OT and ST for short-term rehabilitation. Latest weight 132.6, gained 1.8 lbs this week. She has supplementations such as med pass, MVI and on Remeron to stimulate appetite. She continues to have right heel stage 2 pressure ulcer. Treatment is done daily by wound nurse.   Past Medical History:  Diagnosis Date  . Cerebrovascular disease   . Dementia (HCC)   . Depression   . DM type 2 (diabetes mellitus, type 2) (HCC) 07/02/2011  . Elevated LFTs   . Frequent falls   . HTN (hypertension) 07/02/2011  . Hypertension   . Thrombocytopenia (HCC)    . UTI (lower urinary tract infection)    Past Surgical History:  Procedure Laterality Date  . ABDOMINAL HYSTERECTOMY    . CHOLECYSTECTOMY      No Known Allergies  Outpatient Encounter Medications as of 09/14/2019  Medication Sig  . acetaminophen (TYLENOL) 325 MG tablet Take 650 mg by mouth every 6 (six) hours as needed.  Marland Kitchen aspirin EC 81 MG tablet Take 81 mg by mouth 2 (two) times daily.   . bisacodyl (DULCOLAX) 10 MG suppository If not relieved by MOM, give 10 mg Bisacodyl suppositiory rectally X 1 dose in 24 hours as needed (Do not use constipation standing orders for residents with renal failure/CFR less than 30. Contact MD for orders) (Physician Order)  . donepezil (ARICEPT) 5 MG tablet Take 5 mg by mouth at bedtime.   Marland Kitchen levothyroxine (SYNTHROID, LEVOTHROID) 25 MCG tablet Take 50 mcg by mouth daily before breakfast.   . lisinopril (ZESTRIL) 10 MG tablet Take 10 mg by mouth every morning.   . loratadine (CLARITIN) 10 MG tablet Take 10 mg by mouth daily.  . magnesium hydroxide (MILK OF MAGNESIA) 400 MG/5ML suspension If no BM in 3 days, give 30 cc Milk of Magnesium p.o. x 1 dose in 24 hours as needed (Do not use standing constipation orders for residents with renal failure CFR less than 30. Contact MD for orders) (Physician Order)  . mirtazapine (REMERON) 15 MG tablet Take 15 mg by mouth at bedtime.  . Multiple Vitamin (MULTIVITAMIN WITH MINERALS) TABS tablet Take 1  tablet by mouth 2 (two) times daily.  . NON FORMULARY Mech soft diet  . NON FORMULARY Magic cup twice daily to prevent weight loss  . Nutritional Supplement LIQD Med Pass 120 ml po bid for supplement  . sertraline (ZOLOFT) 25 MG tablet Take 50 mg by mouth every morning. For Depression  . Sodium Phosphates (RA SALINE ENEMA RE) If not relieved by Biscodyl suppository, give disposable Saline Enema rectally X 1 dose/24 hrs as needed (Do not use constipation standing orders for residents with renal failure/CFR less than 30.  Contact MD for orders)(Physician Or  . traMADol (ULTRAM) 50 MG tablet Take by mouth every 6 (six) hours as needed for severe pain.   No facility-administered encounter medications on file as of 09/14/2019.    Review of Systems  Unable to obtain due to dementia    Immunization History  Administered Date(s) Administered  . Influenza, High Dose Seasonal PF 06/29/2019  . Pneumococcal Polysaccharide-23 07/03/2011  . Tdap 10/22/2012   Pertinent  Health Maintenance Due  Topic Date Due  . FOOT EXAM  01/05/1942  . OPHTHALMOLOGY EXAM  01/05/1942  . DEXA SCAN  01/05/1999  . HEMOGLOBIN A1C  12/31/2011  . PNA vac Low Risk Adult (2 of 2 - PCV13) 07/02/2012  . INFLUENZA VACCINE  Completed   Fall Risk  04/20/2019  Falls in the past year? (No Data)  Comment Emmi Telephone Survey: data to providers prior to load  Number falls in past yr: (No Data)  Comment Emmi Telephone Survey Actual Response =      Vitals:   09/14/19 0902  BP: 113/65  Pulse: 74  Resp: 18  Temp: 98.6 F (37 C)  TempSrc: Oral  SpO2: 97%  Weight: 132 lb 9.6 oz (60.1 kg)  Height: 5\' 3"  (1.6 m)   Body mass index is 23.49 kg/m.  Physical Exam  GENERAL APPEARANCE: Well nourished. In no acute distress. Normal body habitus SKIN:  Right heel stage 2 pressure ulcer with dressing MOUTH and THROAT: Lips are without lesions. Oral mucosa is moist and without lesions.  RESPIRATORY: Breathing is even & unlabored, BS CTAB CARDIAC: RRR, no murmur,no extra heart sounds, no edema GI: Abdomen soft, normal BS, no masses, no tenderness EXTREMITIES:  Able to move X 4 extremities NEUROLOGICAL: There is no tremor. Speech is clear. Alert to self, disoriented to time and place. PSYCHIATRIC:  Affect and behavior are appropriate  Labs reviewed: Recent Labs    07/11/19 1347 07/12/19 0520  NA 138 141  K 4.0 4.1  CL 105 113*  CO2 22 22  GLUCOSE 232* 150*  BUN 22 23  CREATININE 1.13* 0.81  CALCIUM 9.0 8.1*   Recent Labs     07/11/19 1347 07/12/19 0520  AST 39 25  ALT 31 22  ALKPHOS 139* 95  BILITOT 2.2* 1.2  PROT 6.1* 4.9*  ALBUMIN 3.5 2.8*   Recent Labs    07/11/19 1347 07/12/19 0520  WBC 9.6 5.2  NEUTROABS 8.8*  --   HGB 12.7 9.9*  HCT 39.3 30.8*  MCV 97.8 98.7  PLT 69* 40*   Lab Results  Component Value Date   TSH 2.780 07/02/2011   Lab Results  Component Value Date   HGBA1C 7.5 (H) 07/02/2011    Assessment/Plan  1. Closed nondisplaced fracture of left acetabulum with routine healing, unspecified portion of acetabulum, subsequent encounter -Currently having PT and OT, for therapeutic strengthening exercises, -Continue as needed tramadol and acetaminophen for pain  2. Pressure ulcer  of right heel, stage 2 (HCC) -Continue wound treatment daily -Continue supplementation with med Pass and MVI  3. Hypothyroidism due to acquired atrophy of thyroid Lab Results  Component Value Date   TSH 2.780 07/02/2011  -Continue levothyroxine   4. Essential hypertension -Well-controlled, continue lisinopril  5. Depression, recurrent (HCC) - mood is stable, continue sertraline and mirtazapine I  6.  Dementia without behavioral disturbance (HCC) -Continue donepezil and supportive care   Family/ staff Communication:  Discussed plan of care with resident.  Labs/tests ordered:  None  Goals of care:  Short-term care    Kenard GowerMonina Medina-Vargas, DNP, FNP-BC [facility] [phone] (Monday-Friday 8:00 a.m. - 5:00 p.m.) [phone] (after hours)

## 2019-09-18 DIAGNOSIS — L89612 Pressure ulcer of right heel, stage 2: Secondary | ICD-10-CM | POA: Insufficient documentation

## 2019-10-05 ENCOUNTER — Encounter: Payer: Self-pay | Admitting: Adult Health

## 2019-10-05 ENCOUNTER — Non-Acute Institutional Stay (SKILLED_NURSING_FACILITY): Payer: Medicare Other | Admitting: Adult Health

## 2019-10-05 DIAGNOSIS — F339 Major depressive disorder, recurrent, unspecified: Secondary | ICD-10-CM

## 2019-10-05 DIAGNOSIS — E034 Atrophy of thyroid (acquired): Secondary | ICD-10-CM | POA: Diagnosis not present

## 2019-10-05 DIAGNOSIS — E1169 Type 2 diabetes mellitus with other specified complication: Secondary | ICD-10-CM | POA: Diagnosis not present

## 2019-10-05 DIAGNOSIS — G309 Alzheimer's disease, unspecified: Secondary | ICD-10-CM

## 2019-10-05 DIAGNOSIS — F0280 Dementia in other diseases classified elsewhere without behavioral disturbance: Secondary | ICD-10-CM

## 2019-10-05 DIAGNOSIS — I10 Essential (primary) hypertension: Secondary | ICD-10-CM | POA: Diagnosis not present

## 2019-10-05 NOTE — Progress Notes (Signed)
Location:  [facility] Room Number: 109/A Place of Service:  SNF (31) Provider:  Kenard Gower, DNP, FNP-BC  Patient Care Team: Benita Stabile, MD as PCP - General (Internal Medicine)  Extended Emergency Contact Information Primary Emergency Contact: [name],[name] Address: [address]          [place], [place] [address] D[place] of [place] Home Phone: [phone] Mobile Phone: [phone] Relation: Daughter  Code Status:  DNR  Goals of care: Advanced Directive information Advanced Directives 10/05/2019  Does Patient Have a Medical Advance Directive? Yes  Type of Advance Directive Out of facility DNR (pink MOST or yellow form)  Does patient want to make changes to medical advance directive? No - Patient declined  Copy of Healthcare Power of Attorney in Chart? -  Would patient like information on creating a medical advance directive? -  Pre-existing out of facility DNR order (yellow form or pink MOST form) Yellow form placed in chart (order not valid for inpatient use)     Chief Complaint  Patient presents with  . Medical Management of Chronic Issues    Routine visit of  medical management    HPI:  Pt is a 86 y.o. female seen today for medical management of chronic diseases.  She has PMH of Alzheimer's disease, CVA, depression, hypertension, diabetes mellitus and hypothyroidism. Family has consented to COVID-19 vaccine.  BPs 109/53, 170/62, 111/60, 117/60, 130/66.  She takes lisinopril for hypertension.  Latest weight 125 lbs.  She takes medazepam 15 mg daily for poor appetite.   Past Medical History:  Diagnosis Date  . Cerebrovascular disease   . Dementia (HCC)   . Depression   . DM type 2 (diabetes mellitus, type 2) (HCC) 07/02/2011  . Elevated LFTs   . Frequent falls   . HTN (hypertension) 07/02/2011  . Hypertension   . Thrombocytopenia (HCC)   . UTI (lower urinary tract infection)    Past Surgical History:  Procedure Laterality  Date  . ABDOMINAL HYSTERECTOMY    . CHOLECYSTECTOMY      No Known Allergies  Outpatient Encounter Medications as of 10/05/2019  Medication Sig  . acetaminophen (TYLENOL) 325 MG tablet Take 650 mg by mouth every 6 (six) hours as needed.  Marland Kitchen aspirin EC 81 MG tablet Take 81 mg by mouth 2 (two) times daily.   . bisacodyl (DULCOLAX) 10 MG suppository If not relieved by MOM, give 10 mg Bisacodyl suppositiory rectally X 1 dose in 24 hours as needed (Do not use constipation standing orders for residents with renal failure/CFR less than 30. Contact MD for orders) (Physician Order)  . donepezil (ARICEPT) 5 MG tablet Take 5 mg by mouth at bedtime.   Marland Kitchen levothyroxine (SYNTHROID, LEVOTHROID) 25 MCG tablet Take 50 mcg by mouth daily before breakfast.   . lisinopril (ZESTRIL) 10 MG tablet Take 10 mg by mouth every morning.   . loratadine (CLARITIN) 10 MG tablet Take 10 mg by mouth daily.  . magnesium hydroxide (MILK OF MAGNESIA) 400 MG/5ML suspension If no BM in 3 days, give 30 cc Milk of Magnesium p.o. x 1 dose in 24 hours as needed (Do not use standing constipation orders for residents with renal failure CFR less than 30. Contact MD for orders) (Physician Order)  . mirtazapine (REMERON) 15 MG tablet Take 15 mg by mouth at bedtime.  . Multiple Vitamin (MULTIVITAMIN WITH MINERALS) TABS tablet Take 1 tablet by mouth 2 (two) times daily.  . NON FORMULARY Mech soft diet  .  NON FORMULARY Magic cup twice daily to prevent weight loss  . Nutritional Supplement LIQD Med Pass 120 ml po bid for supplement  . sertraline (ZOLOFT) 25 MG tablet Take 50 mg by mouth every morning. For Depression  . Sodium Phosphates (RA SALINE ENEMA RE) If not relieved by Biscodyl suppository, give disposable Saline Enema rectally X 1 dose/24 hrs as needed (Do not use constipation standing orders for residents with renal failure/CFR less than 30. Contact MD for orders)(Physician Or  . traMADol (ULTRAM) 50 MG tablet Take by mouth every 6 (six)  hours as needed for severe pain.   No facility-administered encounter medications on file as of 10/05/2019.    Review of Systems unable to obtain due to dementia    Immunization History  Administered Date(s) Administered  . Influenza, High Dose Seasonal PF 06/29/2019  . Pneumococcal Polysaccharide-23 07/03/2011  . Tdap 10/22/2012   Pertinent  Health Maintenance Due  Topic Date Due  . FOOT EXAM  01/05/1942  . OPHTHALMOLOGY EXAM  01/05/1942  . DEXA SCAN  01/05/1999  . HEMOGLOBIN A1C  12/31/2011  . PNA vac Low Risk Adult (2 of 2 - PCV13) 07/02/2012  . INFLUENZA VACCINE  Completed   Fall Risk  04/20/2019  Falls in the past year? (No Data)  Comment Emmi Telephone Survey: data to providers prior to load  Number falls in past yr: (No Data)  Comment Emmi Telephone Survey Actual Response =      Vitals:   10/05/19 1517  BP: (!) 109/53  Pulse: 73  Resp: 18  Temp: 98.1 F (36.7 C)  TempSrc: Oral  SpO2: 97%  Weight: 125 lb (56.7 kg)  Height: 5\' 3"  (1.6 m)   Body mass index is 22.14 kg/m.  Physical Exam  GENERAL APPEARANCE: Well nourished. In no acute distress. Normal body habitus SKIN:  Right heel pressure ulcer stage 2, with dressing MOUTH and THROAT: Lips are without lesions. Oral mucosa is moist and without lesions. Tongue is normal in shape, size, and color and without lesions RESPIRATORY: Breathing is even & unlabored, BS CTAB CARDIAC: RRR, no murmur,no extra heart sounds, no edema GI: Abdomen soft, normal BS, no masses, no tenderness EXTREMITIES:  Able to move X 4 extremities NEUROLOGICAL: There is no tremor. Speech is clear. Alert to self, disoriented to time and place. PSYCHIATRIC:  Affect and behavior are appropriate  Labs reviewed: Recent Labs    07/11/19 1347 07/12/19 0520  NA 138 141  K 4.0 4.1  CL 105 113*  CO2 22 22  GLUCOSE 232* 150*  BUN 22 23  CREATININE 1.13* 0.81  CALCIUM 9.0 8.1*   Recent Labs    07/11/19 1347 07/12/19 0520  AST 39 25   ALT 31 22  ALKPHOS 139* 95  BILITOT 2.2* 1.2  PROT 6.1* 4.9*  ALBUMIN 3.5 2.8*   Recent Labs    07/11/19 1347 07/12/19 0520  WBC 9.6 5.2  NEUTROABS 8.8*  --   HGB 12.7 9.9*  HCT 39.3 30.8*  MCV 97.8 98.7  PLT 69* 40*   Lab Results  Component Value Date   TSH 2.780 07/02/2011   Lab Results  Component Value Date   HGBA1C 7.5 (H) 07/02/2011     Assessment/Plan  1. Essential hypertension - stable, continue lisinopril  2. Hypothyroidism due to acquired atrophy of thyroid Lab Results  Component Value Date   TSH 2.780 07/02/2011  -Continue levothyroxine  3. Type 2 diabetes mellitus with other specified complication, without long-term current use  of insulin (HCC) Lab Results  Component Value Date   HGBA1C 7.5 (H) 07/02/2011  - diet-controlled  4. Depression, recurrent (HCC) -Mood is stable, continue sertraline and mirtazapine  5. Alzheimer's dementia without behavioral disturbance, unspecified timing of dementia onset (HCC) -Continue donepezil and supportive care, fall precautions      Family/ staff Communication: Discussed plan of care with resident and charge nurse.  Labs/tests ordered: None  Goals of care:   Long-term care   Kenard Gower, DNP, FNP-BC [facility] [phone] (Monday-Friday 8:00 a.m. - 5:00 p.m.) [phone] (after hours)

## 2019-10-19 ENCOUNTER — Non-Acute Institutional Stay (SKILLED_NURSING_FACILITY): Payer: Medicare Other | Admitting: Adult Health

## 2019-10-19 ENCOUNTER — Encounter: Payer: Self-pay | Admitting: Adult Health

## 2019-10-19 DIAGNOSIS — G309 Alzheimer's disease, unspecified: Secondary | ICD-10-CM

## 2019-10-19 DIAGNOSIS — I10 Essential (primary) hypertension: Secondary | ICD-10-CM | POA: Diagnosis not present

## 2019-10-19 DIAGNOSIS — R634 Abnormal weight loss: Secondary | ICD-10-CM

## 2019-10-19 DIAGNOSIS — Z7189 Other specified counseling: Secondary | ICD-10-CM | POA: Diagnosis not present

## 2019-10-19 DIAGNOSIS — F339 Major depressive disorder, recurrent, unspecified: Secondary | ICD-10-CM

## 2019-10-19 DIAGNOSIS — R630 Anorexia: Secondary | ICD-10-CM

## 2019-10-19 DIAGNOSIS — E034 Atrophy of thyroid (acquired): Secondary | ICD-10-CM | POA: Diagnosis not present

## 2019-10-19 DIAGNOSIS — F0280 Dementia in other diseases classified elsewhere without behavioral disturbance: Secondary | ICD-10-CM

## 2019-10-19 NOTE — Progress Notes (Signed)
Location:  [facility] Room Number: 109/A Place of Service:  SNF (31) Provider:  Kenard Gower, DNP, FNP-BC  Patient Care Team: Benita Stabile, MD as PCP - General (Internal Medicine)  Extended Emergency Contact Information Primary Emergency Contact: [name],[name] Address: [address]          [place], [place] [address] [place] of [place] Home Phone: [phone] Mobile Phone: [phone] Relation: Daughter  Code Status:  DNR  Goals of care: Advanced Directive information Advanced Directives 10/19/2019  Does Patient Have a Medical Advance Directive? Yes  Type of Advance Directive Out of facility DNR (pink MOST or yellow form)  Does patient want to make changes to medical advance directive? No - Patient declined  Copy of Healthcare Power of Attorney in Chart? -  Would patient like information on creating a medical advance directive? -  Pre-existing out of facility DNR order (yellow form or pink MOST form) Yellow form placed in chart (order not valid for inpatient use)     Chief Complaint  Patient presents with  . Advanced Directive    Advanced Care Planning    HPI:  Pt is a 86 y.o. female who had advance care planning today attended by NP, Social Worker and MDS coordinator. Resident was invited to the meeting but she declined. Daughter was called and left message on her voicemail. She remains to be DNR. This is her S/P 10th day Moderna COVID-19 vaccination. No side effects noted. No reported fever, SOB, cough nor body aches. A total of 7 lbs weight loss in a month was noted. She takes Mirtazapine 15 mg at bedtime.  BPs 110/57, 132/70, 112/54, 104/62, 108/72.  She takes lisinopril for hypertension.  She continues to take PRN Tramadol for pain. Reported to scream/yell occasionally. She takes Sertraline for depression. The meeting lasted for 25 minutes.   Past Medical History:  Diagnosis Date  . Cerebrovascular disease   . Dementia (HCC)   .  Depression   . DM type 2 (diabetes mellitus, type 2) (HCC) 07/02/2011  . Elevated LFTs   . Frequent falls   . HTN (hypertension) 07/02/2011  . Hypertension   . Thrombocytopenia (HCC)   . UTI (lower urinary tract infection)    Past Surgical History:  Procedure Laterality Date  . ABDOMINAL HYSTERECTOMY    . CHOLECYSTECTOMY      No Known Allergies  Outpatient Encounter Medications as of 10/19/2019  Medication Sig  . acetaminophen (TYLENOL) 325 MG tablet Take 650 mg by mouth every 6 (six) hours as needed.  Marland Kitchen aspirin EC 81 MG tablet Take 81 mg by mouth 2 (two) times daily.   . bisacodyl (DULCOLAX) 10 MG suppository If not relieved by MOM, give 10 mg Bisacodyl suppositiory rectally X 1 dose in 24 hours as needed (Do not use constipation standing orders for residents with renal failure/CFR less than 30. Contact MD for orders) (Physician Order)  . donepezil (ARICEPT) 5 MG tablet Take 5 mg by mouth at bedtime.   Marland Kitchen levothyroxine (SYNTHROID, LEVOTHROID) 25 MCG tablet Take 50 mcg by mouth daily before breakfast.   . lisinopril (ZESTRIL) 10 MG tablet Take 10 mg by mouth every morning.   . loratadine (CLARITIN) 10 MG tablet Take 10 mg by mouth daily.  . magnesium hydroxide (MILK OF MAGNESIA) 400 MG/5ML suspension If no BM in 3 days, give 30 cc Milk of Magnesium p.o. x 1 dose in 24 hours as needed (Do not use standing constipation orders for residents with  renal failure CFR less than 30. Contact MD for orders) (Physician Order)  . mirtazapine (REMERON) 15 MG tablet Take 15 mg by mouth at bedtime.  . Multiple Vitamin (MULTIVITAMIN WITH MINERALS) TABS tablet Take 1 tablet by mouth 2 (two) times daily.  . NON FORMULARY Mech soft diet  . NON FORMULARY Magic cup twice daily to prevent weight loss  . Nutritional Supplement LIQD Med Pass 120 ml po bid for supplement  . sertraline (ZOLOFT) 25 MG tablet Take 50 mg by mouth every morning. For Depression  . Sodium Phosphates (RA SALINE ENEMA RE) If not  relieved by Biscodyl suppository, give disposable Saline Enema rectally X 1 dose/24 hrs as needed (Do not use constipation standing orders for residents with renal failure/CFR less than 30. Contact MD for orders)(Physician Or  . traMADol (ULTRAM) 50 MG tablet Take by mouth every 6 (six) hours as needed for severe pain.   No facility-administered encounter medications on file as of 10/19/2019.    Review of Systems  Unable to obtain due to dementia    Immunization History  Administered Date(s) Administered  . Influenza, High Dose Seasonal PF 06/29/2019  . Moderna SARS-COVID-2 Vaccination 10/09/2018  . Pneumococcal Polysaccharide-23 07/03/2011  . Tdap 10/22/2012   Pertinent  Health Maintenance Due  Topic Date Due  . FOOT EXAM  01/05/1942  . OPHTHALMOLOGY EXAM  01/05/1942  . DEXA SCAN  01/05/1999  . HEMOGLOBIN A1C  12/31/2011  . PNA vac Low Risk Adult (2 of 2 - PCV13) 07/02/2012  . INFLUENZA VACCINE  Completed   Fall Risk  04/20/2019  Falls in the past year? (No Data)  Comment Emmi Telephone Survey: data to providers prior to load  Number falls in past yr: (No Data)  Comment Emmi Telephone Survey Actual Response =      Vitals:   10/19/19 1331  BP: 128/68  Pulse: 71  Resp: 17  Temp: 97.7 F (36.5 C)  TempSrc: Oral  SpO2: 97%  Weight: 122 lb 12.8 oz (55.7 kg)  Height: 5\' 3"  (1.6 m)   Body mass index is 21.75 kg/m.  Physical Exam  GENERAL APPEARANCE:  In no acute distress. Normal body habitus SKIN:  Skin is warm and dry.  MOUTH and THROAT: Lips are without lesions. Oral mucosa is moist and without lesions. Tongue is normal in shape, size, and color and without lesions RESPIRATORY: Breathing is even & unlabored, BS CTAB CARDIAC: RRR, no murmur,no extra heart sounds, no edema GI: Abdomen soft, normal BS, no masses, no tenderness NEUROLOGICAL: There is no tremor. Speech is clear PSYCHIATRIC:  Affect and behavior are appropriate  Labs reviewed: Recent Labs     07/11/19 1347 07/12/19 0520  NA 138 141  K 4.0 4.1  CL 105 113*  CO2 22 22  GLUCOSE 232* 150*  BUN 22 23  CREATININE 1.13* 0.81  CALCIUM 9.0 8.1*   Recent Labs    07/11/19 1347 07/12/19 0520  AST 39 25  ALT 31 22  ALKPHOS 139* 95  BILITOT 2.2* 1.2  PROT 6.1* 4.9*  ALBUMIN 3.5 2.8*   Recent Labs    07/11/19 1347 07/12/19 0520  WBC 9.6 5.2  NEUTROABS 8.8*  --   HGB 12.7 9.9*  HCT 39.3 30.8*  MCV 97.8 98.7  PLT 69* 40*   Lab Results  Component Value Date   TSH 2.780 07/02/2011   Lab Results  Component Value Date   HGBA1C 7.5 (H) 07/02/2011     Assessment/Plan  1. Advance  care planning - continues to be DNR - discussed medications, vital signs and weight loss  2. Hypothyroidism due to acquired atrophy of thyroid Lab Results  Component Value Date   TSH 8.29 (A) 07/26/2019  -Continue levothyroxine  3. Essential hypertension -Stable, continue lisinopril  4. Major depression, recurrent, chronic (HCC) - mood is stable, continue Sertraline, followed by psych NP  5. Poor appetite - continue Mirtazapine at bedtime  6. Weight loss - had weiight loss 7 lbs in a month, continue Magic cup and med Pass  7. Alzheimer's dementia without behavioral disturbance, unspecified timing of dementia onset (HCC) -Continue donepezil, fall precautions     Family/ staff Communication: Discussed plan of care with IDT.  Labs/tests ordered: None  Goals of care:   Long-term care.   Kenard Gower, DNP, FNP-BC [facility] [phone] (Monday-Friday 8:00 a.m. - 5:00 p.m.) [phone] (after hours)

## 2019-10-21 ENCOUNTER — Non-Acute Institutional Stay (SKILLED_NURSING_FACILITY): Payer: Medicare Other | Admitting: Adult Health

## 2019-10-21 ENCOUNTER — Encounter: Payer: Self-pay | Admitting: Adult Health

## 2019-10-21 DIAGNOSIS — U071 COVID-19: Secondary | ICD-10-CM | POA: Diagnosis not present

## 2019-10-21 NOTE — Progress Notes (Signed)
Location:  [facility] Room Number: 109/A Place of Service:  SNF (31) Provider:  Kenard Gower, DNP, FNP-BC  Patient Care Team: Benita Stabile, MD as PCP - General (Internal Medicine)  Extended Emergency Contact Information Primary Emergency Contact: [name],[name] Address: [address]          [place], [place] [address] [place] of [place] Home Phone: [phone] Mobile Phone: [phone] Relation: Daughter  Code Status:  DNR  Goals of care: Advanced Directive information Advanced Directives 10/21/2019  Does Patient Have a Medical Advance Directive? Yes  Type of Advance Directive Out of facility DNR (pink MOST or yellow form)  Does patient want to make changes to medical advance directive? No - Patient declined  Copy of Healthcare Power of Attorney in Chart? -  Would patient like information on creating a medical advance directive? -  Pre-existing out of facility DNR order (yellow form or pink MOST form) Yellow form placed in chart (order not valid for inpatient use)     Chief Complaint  Patient presents with  . Acute Visit    Positive rapid Covid-19 test    HPI:  Pt is a 86 y.o. female who tested positive for Covid-19 rapid test. There is no reported fever, SOB nor body aches. She She had a weight loss of 7 lbs in a month. Right heel pressure ulcer is now healed. This is S/P 12th day Modernal COVID-19 vaccination.   Past Medical History:  Diagnosis Date  . Cerebrovascular disease   . Dementia (HCC)   . Depression   . DM type 2 (diabetes mellitus, type 2) (HCC) 07/02/2011  . Elevated LFTs   . Frequent falls   . HTN (hypertension) 07/02/2011  . Hypertension   . Thrombocytopenia (HCC)   . UTI (lower urinary tract infection)    Past Surgical History:  Procedure Laterality Date  . ABDOMINAL HYSTERECTOMY    . CHOLECYSTECTOMY      No Known Allergies  Outpatient Encounter Medications as of 10/21/2019  Medication Sig  .  acetaminophen (TYLENOL) 325 MG tablet Take 650 mg by mouth every 6 (six) hours as needed.  Marland Kitchen aspirin EC 81 MG tablet Take 81 mg by mouth 2 (two) times daily.   . bisacodyl (DULCOLAX) 10 MG suppository If not relieved by MOM, give 10 mg Bisacodyl suppositiory rectally X 1 dose in 24 hours as needed (Do not use constipation standing orders for residents with renal failure/CFR less than 30. Contact MD for orders) (Physician Order)  . donepezil (ARICEPT) 5 MG tablet Take 5 mg by mouth at bedtime.   Marland Kitchen levothyroxine (SYNTHROID, LEVOTHROID) 25 MCG tablet Take 50 mcg by mouth daily before breakfast.   . lisinopril (ZESTRIL) 10 MG tablet Take 10 mg by mouth every morning.   . loratadine (CLARITIN) 10 MG tablet Take 10 mg by mouth daily.  . magnesium hydroxide (MILK OF MAGNESIA) 400 MG/5ML suspension If no BM in 3 days, give 30 cc Milk of Magnesium p.o. x 1 dose in 24 hours as needed (Do not use standing constipation orders for residents with renal failure CFR less than 30. Contact MD for orders) (Physician Order)  . mirtazapine (REMERON) 15 MG tablet Take 15 mg by mouth at bedtime.  . Multiple Vitamin (MULTIVITAMIN WITH MINERALS) TABS tablet Take 1 tablet by mouth 2 (two) times daily.  . NON FORMULARY Mech soft diet  . NON FORMULARY Magic cup twice daily to prevent weight loss  . Nutritional Supplement LIQD Med Pass  120 ml po bid for supplement  . sertraline (ZOLOFT) 25 MG tablet Take 50 mg by mouth every morning. For Depression  . Sodium Phosphates (RA SALINE ENEMA RE) If not relieved by Biscodyl suppository, give disposable Saline Enema rectally X 1 dose/24 hrs as needed (Do not use constipation standing orders for residents with renal failure/CFR less than 30. Contact MD for orders)(Physician Or  . traMADol (ULTRAM) 50 MG tablet Take by mouth every 6 (six) hours as needed for severe pain.   No facility-administered encounter medications on file as of 10/21/2019.    Review of Systems Unable to obtain due  to dementia    Immunization History  Administered Date(s) Administered  . Influenza, High Dose Seasonal PF 06/29/2019  . Moderna SARS-COVID-2 Vaccination 10/09/2018  . Pneumococcal Polysaccharide-23 07/03/2011  . Tdap 10/22/2012   Pertinent  Health Maintenance Due  Topic Date Due  . FOOT EXAM  01/05/1942  . OPHTHALMOLOGY EXAM  01/05/1942  . DEXA SCAN  01/05/1999  . PNA vac Low Risk Adult (2 of 2 - PCV13) 07/02/2012  . HEMOGLOBIN A1C  01/23/2020  . INFLUENZA VACCINE  Completed   Fall Risk  04/20/2019  Falls in the past year? (No Data)  Comment Emmi Telephone Survey: data to providers prior to load  Number falls in past yr: (No Data)  Comment Emmi Telephone Survey Actual Response =      Vitals:   10/21/19 1428  BP: 120/69  Pulse: 77  Resp: 16  Temp: 98.9 F (37.2 C)  TempSrc: Oral  SpO2: 97%  Weight: 122 lb 12.8 oz (55.7 kg)  Height: 5\' 3"  (1.6 m)   Body mass index is 21.75 kg/m.  Physical Exam  GENERAL APPEARANCE:  In no acute distress. Normal body habitus SKIN:  Skin is warm and dry.  MOUTH and THROAT: Lips are without lesions. Oral mucosa is moist and without lesions. Tongue is normal in shape, size, and color and without lesions RESPIRATORY: Breathing is even & unlabored, BS CTAB CARDIAC: RRR, no murmur,no extra heart sounds, no edema GI: Abdomen soft, normal BS, no masses, no tenderness NEUROLOGICAL: There is no tremor. Speech is clear. PSYCHIATRIC:  Affect and behavior are appropriate  Labs reviewed: Recent Labs    07/11/19 1347 07/12/19 0520  NA 138 141  K 4.0 4.1  CL 105 113*  CO2 22 22  GLUCOSE 232* 150*  BUN 22 23  CREATININE 1.13* 0.81  CALCIUM 9.0 8.1*   Recent Labs    07/11/19 1347 07/12/19 0520  AST 39 25  ALT 31 22  ALKPHOS 139* 95  BILITOT 2.2* 1.2  PROT 6.1* 4.9*  ALBUMIN 3.5 2.8*   Recent Labs    07/11/19 1347 07/12/19 0520 07/26/19 0000  WBC 9.6 5.2 3.4  NEUTROABS 8.8*  --   --   HGB 12.7 9.9* 9.7*  HCT 39.3 30.8*  27*  MCV 97.8 98.7  --   PLT 69* 40* 144*   Lab Results  Component Value Date   TSH 8.29 (A) 07/26/2019   Lab Results  Component Value Date   HGBA1C 4.3 07/26/2019    Assessment/Plan  1. Lab test positive for detection of COVID-19 virus - COVID-19 Ag test was positive, she has no SOB nor fever - was transferred to COVID-19 isolation room - will start early treatment to prevent escalation of COVID-19 symptoms -Zinc sulfate 220 mg daily x5 days, azithromycin 250 mg twice a day x5 days and prednisone 10 mg give 3 tabs =  30 mg twice a day x5 days    Family/ staff Communication:  Discussed plan of care with resident and charge nurse.  Labs/tests ordered:  CBC and BMP on 10/24/19  Goals of care:   Long-term care   Kenard Gower, DNP, FNP-BC [facility] [phone] (Monday-Friday 8:00 a.m. - 5:00 p.m.) [phone] (after hours)

## 2019-10-24 LAB — BASIC METABOLIC PANEL
BUN: 17 (ref 4–21)
CO2: 25 — AB (ref 13–22)
Chloride: 102 (ref 99–108)
Creatinine: 0.6 (ref 0.5–1.1)
Glucose: 301
Potassium: 4.5 (ref 3.4–5.3)
Sodium: 138 (ref 137–147)

## 2019-10-24 LAB — CBC AND DIFFERENTIAL
HCT: 35 — AB (ref 36–46)
Hemoglobin: 12.1 (ref 12.0–16.0)
Platelets: 68 — AB (ref 150–399)
WBC: 1.4

## 2019-10-24 LAB — COMPREHENSIVE METABOLIC PANEL
Calcium: 9 (ref 8.7–10.7)
GFR calc Af Amer: >90
GFR calc non Af Amer: 83.85

## 2019-10-24 LAB — CBC: RBC: 3.66 — AB (ref 3.87–5.11)

## 2019-10-28 ENCOUNTER — Encounter: Payer: Self-pay | Admitting: Adult Health

## 2019-10-28 ENCOUNTER — Non-Acute Institutional Stay (SKILLED_NURSING_FACILITY): Payer: Medicare Other | Admitting: Adult Health

## 2019-10-28 DIAGNOSIS — R630 Anorexia: Secondary | ICD-10-CM | POA: Diagnosis not present

## 2019-10-28 DIAGNOSIS — U071 COVID-19: Secondary | ICD-10-CM

## 2019-10-28 DIAGNOSIS — E034 Atrophy of thyroid (acquired): Secondary | ICD-10-CM | POA: Diagnosis not present

## 2019-10-28 DIAGNOSIS — D72819 Decreased white blood cell count, unspecified: Secondary | ICD-10-CM

## 2019-10-28 DIAGNOSIS — D696 Thrombocytopenia, unspecified: Secondary | ICD-10-CM

## 2019-10-28 DIAGNOSIS — I10 Essential (primary) hypertension: Secondary | ICD-10-CM

## 2019-10-28 DIAGNOSIS — F0280 Dementia in other diseases classified elsewhere without behavioral disturbance: Secondary | ICD-10-CM

## 2019-10-28 DIAGNOSIS — F339 Major depressive disorder, recurrent, unspecified: Secondary | ICD-10-CM

## 2019-10-28 DIAGNOSIS — G309 Alzheimer's disease, unspecified: Secondary | ICD-10-CM

## 2019-10-28 NOTE — Progress Notes (Addendum)
Location:  [facility] Room Number: 761/Y Place of Service:  SNF (31) Provider:  Durenda Age, DNP, FNP-BC  Patient Care Team: Celene Squibb, MD as PCP - General (Internal Medicine)  Extended Emergency Contact Information Primary Emergency Contact: [name],[name] Address: [address]          [place], [place] [address] Johnnette Litter of [place] Phone: [phone] Mobile Phone: [phone] Relation: Daughter  Code Status:  DNR  Goals of care: Advanced Directive information Advanced Directives 10/28/2019  Does Patient Have a Medical Advance Directive? Yes  Type of Advance Directive Out of facility DNR (pink MOST or yellow form)  Does patient want to make changes to medical advance directive? No - Patient declined  Copy of [place] in Chart? -  Would patient like information on creating a medical advance directive? -  Pre-existing out of facility DNR order (yellow form or pink MOST form) Yellow form placed in chart (order not valid for inpatient use)     Chief Complaint  Patient presents with  . Follow-up    Covid    HPI:  Pt is a 86 y.o. female who was recently diagnosed with COVID-19 on 10/21/19. She was immediately  Started on azithromycin, zinc sulfate and Prednisone. Azithromycin was extended for additional 5 days, total of 10 days. Latest CBC revealed WBC 1.4 which is down from 3.4 (07/26/19). She was reported to have low grade fever T 100.7 on 10/22/19. No reported SOB. She continues to have poor appetite and currently on  Remeron, medpass, magic cup and multivitamins.  Past Medical History:  Diagnosis Date  . Cerebrovascular disease   . Dementia ([facility])   . Depression   . DM type 2 (diabetes mellitus, type 2) ([facility]) 07/02/2011  . Elevated LFTs   . Frequent falls   . HTN (hypertension) 07/02/2011  . Hypertension   . Thrombocytopenia ([facility])   . UTI (lower urinary tract infection)    Past Surgical History:  Procedure  Laterality Date  . ABDOMINAL HYSTERECTOMY    . CHOLECYSTECTOMY      No Known Allergies  Outpatient Encounter Medications as of 10/28/2019  Medication Sig  . acetaminophen (TYLENOL) 325 MG tablet Take 650 mg by mouth every 6 (six) hours as needed.  Marland Kitchen aspirin EC 81 MG tablet Take 81 mg by mouth 2 (two) times daily.   Marland Kitchen azithromycin (ZITHROMAX) 250 MG tablet Take 250 mg by mouth daily.  . bisacodyl (DULCOLAX) 10 MG suppository If not relieved by MOM, give 10 mg Bisacodyl suppositiory rectally X 1 dose in 24 hours as needed (Do not use constipation standing orders for residents with renal failure/CFR less than 30. Contact MD for orders) (Physician Order)  . donepezil (ARICEPT) 5 MG tablet Take 5 mg by mouth at bedtime.   Marland Kitchen levothyroxine (SYNTHROID, LEVOTHROID) 25 MCG tablet Take 50 mcg by mouth daily before breakfast.   . lisinopril (ZESTRIL) 10 MG tablet Take 10 mg by mouth every morning.   . loratadine (CLARITIN) 10 MG tablet Take 10 mg by mouth daily.  . magnesium hydroxide (MILK OF MAGNESIA) 400 MG/5ML suspension If no BM in 3 days, give 30 cc Milk of Magnesium p.o. x 1 dose in 24 hours as needed (Do not use standing constipation orders for residents with renal failure CFR less than 30. Contact MD for orders) (Physician Order)  . mirtazapine (REMERON) 15 MG tablet Take 15 mg by mouth at bedtime.  . Multiple Vitamin (MULTIVITAMIN WITH MINERALS)  TABS tablet Take 1 tablet by mouth 2 (two) times daily.  . NON FORMULARY Mech soft diet  . NON FORMULARY Magic cup twice daily to prevent weight loss  . Nutritional Supplement LIQD Med Pass 120 ml po bid for supplement  . saccharomyces boulardii (FLORASTOR) 250 MG capsule Take 250 mg by mouth 2 (two) times daily.  . sertraline (ZOLOFT) 25 MG tablet Take 50 mg by mouth every morning. For Depression  . Sodium Phosphates (RA SALINE ENEMA RE) If not relieved by Biscodyl suppository, give disposable Saline Enema rectally X 1 dose/24 hrs as needed (Do not use  constipation standing orders for residents with renal failure/CFR less than 30. Contact MD for orders)(Physician Or  . traMADol (ULTRAM) 50 MG tablet Take by mouth every 6 (six) hours as needed for severe pain.  Marland Kitchen zinc sulfate 220 (50 Zn) MG capsule Take 220 mg by mouth daily.   No facility-administered encounter medications on file as of 10/28/2019.    Review of Systems  Unable to obtain due to dementia.    Immunization History  Administered Date(s) Administered  . Influenza, High Dose Seasonal PF 06/29/2019  . Moderna SARS-COVID-2 Vaccination 10/09/2018  . Pneumococcal Polysaccharide-23 07/03/2011  . Tdap 10/22/2012   Pertinent  Health Maintenance Due  Topic Date Due  . FOOT EXAM  01/05/1942  . OPHTHALMOLOGY EXAM  01/05/1942  . DEXA SCAN  01/05/1999  . PNA vac Low Risk Adult (2 of 2 - PCV13) 07/02/2012  . HEMOGLOBIN A1C  01/23/2020  . INFLUENZA VACCINE  Completed   Fall Risk  04/20/2019  Falls in the past year? (No Data)  Comment Emmi Telephone Survey: data to providers prior to load  Number falls in past yr: (No Data)  Comment Emmi Telephone Survey Actual Response =      Vitals:   10/28/19 1511  BP: (!) 121/55  Pulse: (!) 54  Resp: 18  Temp: (!) 97.3 F (36.3 C)  TempSrc: Oral  SpO2: 97%  Weight: 122 lb 12.8 oz (55.7 kg)  Height: 5\' 3"  (1.6 m)   Body mass index is 21.75 kg/m.  Physical Exam  GENERAL APPEARANCE: Well nourished. In no acute distress. Normal body habitus SKIN:  Skin is warm and dry.  MOUTH and THROAT: Lips are without lesions. Oral mucosa is moist and without lesions.  RESPIRATORY: Breathing is even & unlabored, BS CTAB CARDIAC: RRR, no murmur,no extra heart sounds, no edema GI: Abdomen soft, normal BS, no masses, no tenderness NEUROLOGICAL: There is no tremor. Speech is clear.  Alert to self, disoriented to time and place. PSYCHIATRIC: Affect and behavior are appropriate    Labs reviewed: Recent Labs    07/11/19 1347 07/12/19 0520  10/24/19 0000  NA 138 141 138  K 4.0 4.1 4.5  CL 105 113* 102  CO2 22 22 25*  GLUCOSE 232* 150*  --   BUN 22 23 17   CREATININE 1.13* 0.81 0.6  CALCIUM 9.0 8.1* 9.0   Recent Labs    07/11/19 1347 07/12/19 0520  AST 39 25  ALT 31 22  ALKPHOS 139* 95  BILITOT 2.2* 1.2  PROT 6.1* 4.9*  ALBUMIN 3.5 2.8*   Recent Labs    07/11/19 1347 07/11/19 1347 07/12/19 0520 07/26/19 0000 10/24/19 0000  WBC 9.6   < > 5.2 3.4 1.4  NEUTROABS 8.8*  --   --   --   --   HGB 12.7   < > 9.9* 9.7* 12.1  HCT 39.3   < >  30.8* 27* 35*  MCV 97.8  --  98.7  --   --   PLT 69*   < > 40* 144* 68*   < > = values in this interval not displayed.   Lab Results  Component Value Date   TSH 8.29 (A) 07/26/2019   Lab Results  Component Value Date   HGBA1C 4.3 07/26/2019    Assessment/Plan  1. COVID-19 - tested positive for COVID-19 Ag on 10/21/19, had first dose of Moderna COVID-19 vaccine on 10/10/2019, continue azithromycin, zinc sulfate, and ASA -Monitor for SOB, fever, body aches  2. Thrombocytopenia ([facility]) Lab Results  Component Value Date   PLT 68 (A) 10/24/2019  -Monitor for bruising  3. Poor appetite Wt Readings from Last 3 Encounters:  10/28/19 122 lb 12.8 oz (55.7 kg)  10/21/19 122 lb 12.8 oz (55.7 kg)  10/19/19 122 lb 12.8 oz (55.7 kg)   -Weight is a stable, continue medazepam, med Pass, Magic cup and multivitamins  4. Hypothyroidism due to acquired atrophy of thyroid Lab Results  Component Value Date   TSH 8.29 (A) 07/26/2019   -Continue levothyroxine  5. Essential hypertension -Controlled, continue lisinopril  6. Major depression, recurrent, chronic (HCC) -Stable, continue sertraline, followed up by psych NP  7. Alzheimer's dementia without behavioral disturbance, unspecified timing of dementia onset (HCC) -Continue donepezil, fall precautions and supportive care  8.  Leukopenia Lab Results  Component Value Date   WBC 1.4 10/24/2019  - probably due to COVID-19,  will repeat in 1 week - neutropenic precautions   Family/ staff Communication: Discussed plan of care with charge nurse.  Labs/tests ordered: None  Goals of care:   Long-term care   Kenard Gower, DNP, FNP-BC [facility] [phone] (Monday-Friday 8:00 a.m. - 5:00 p.m.) [phone] (after hours)

## 2019-11-08 ENCOUNTER — Encounter: Payer: Self-pay | Admitting: Internal Medicine

## 2019-11-08 ENCOUNTER — Non-Acute Institutional Stay (SKILLED_NURSING_FACILITY): Payer: Medicare Other | Admitting: Internal Medicine

## 2019-11-08 DIAGNOSIS — R627 Adult failure to thrive: Secondary | ICD-10-CM

## 2019-11-08 DIAGNOSIS — E1151 Type 2 diabetes mellitus with diabetic peripheral angiopathy without gangrene: Secondary | ICD-10-CM

## 2019-11-08 DIAGNOSIS — U071 COVID-19: Secondary | ICD-10-CM | POA: Diagnosis not present

## 2019-11-08 DIAGNOSIS — D696 Thrombocytopenia, unspecified: Secondary | ICD-10-CM

## 2019-11-08 DIAGNOSIS — E034 Atrophy of thyroid (acquired): Secondary | ICD-10-CM

## 2019-11-08 DIAGNOSIS — G309 Alzheimer's disease, unspecified: Secondary | ICD-10-CM

## 2019-11-08 DIAGNOSIS — F0280 Dementia in other diseases classified elsewhere without behavioral disturbance: Secondary | ICD-10-CM

## 2019-11-08 NOTE — Assessment & Plan Note (Addendum)
On  10/24/2019 random glucose was 301, but on 07/26/2019 A1c was 4.3%.  This disconnect is not explained by renal function which is normal.  The creatinine of 0.6 may relate to decreased muscle mass in the context of her known weight loss.

## 2019-11-08 NOTE — Assessment & Plan Note (Addendum)
Pharmacy questions if generic Aricept causing anorexia with associated weight loss.  It is unlikely that the generic Aricept is of significant benefit at her advanced age. This agent will be discontinued and she will be monitored for mental status or behavioral changes.

## 2019-11-08 NOTE — Assessment & Plan Note (Addendum)
Most recent platelet count on 10/24/2019 was 68,000.  On 07/12/2019 it was 40,000 and on 07/26/2019 144,000. She is on 81 mg of aspirin twice a day but no other anticoagulants. No bleeding dyscrasias reported by staff.

## 2019-11-08 NOTE — Assessment & Plan Note (Signed)
07/26/2019 TSH 8.29; TSH will be updated.

## 2019-11-08 NOTE — Patient Instructions (Signed)
See assessment and plan under each diagnosis in the problem list and acutely for this visit 

## 2019-11-08 NOTE — Progress Notes (Signed)
NURSING HOME LOCATION:  [facility] ROOM NUMBER:  117/A  CODE STATUS:  DNR  PCP:  Pecola Lawless MD.  This is a nursing facility follow up of chronic medical diagnoses  Interim medical record and care since last [facility] visit was updated with review of diagnostic studies and change in clinical status since last visit were documented.  HPI: She is a permanent resident of the facility with medical diagnoses of essential hypertension, history of elevated LFTs, hypothyroidism, diabetes with peripheral vascular disease, depression, history of cerebrovascular disease with dementia and history of multiple fractures related to recurrent falls.. Surgeries include abdominal hysterectomy and cholecystectomy. Significant new history is that her COVID-19 screen was positive on 10/21/2019 despite having received the Moderna vaccine 10/20/2019. She received azithromycin, zinc sulfate, and oral prednisone.  Azithromycin was extended for additional 5 days for total of 10.  CBC follow-up revealed a white count of 1400.  Low-grade fever up to 100.7 was reported on 1/30.  As noted she is presently afebrile with normal heart rate and excellent O2 sats on room air.  She is on low-dose aspirin twice a day.  Current and most recent labs were reviewed.  On 10/24/2019 white count was 1400; white count has ranged from 3.4 up to 5.2.  She previously had a significant anemia with hemoglobin 9.7/hematocrit 27.  On 10/24/2019 these values were 12.1/35.  The platelet count had dropped from 144,000 on 07/25/2021 to 68,000 on 2/1.  On 07/12/2019 platelet count had been even lower at 40,000.  She is on low-dose aspirin 81 mg twice a day but no other anticoagulants. On 07/26/2019 TSH was elevated at 8.29.  She is on 50 mcg of L-thyroxine.  Despite a random glucose of 301 on 10/24/2019 her A1c was 4.3% on 07/26/2019.  Despite the disconnect between the random hyperglycemia and the previously low A1c, renal function is  normal.  On 2/1 creatinine was 0.6, BUN 17, and GFR 84. She is on tramadol 50 mg every 6 hours as needed as well as sertraline 50 mg daily.  Review of systems: Essentially she was nonverbal which precluded completion of review of systems.  Staff reports no bleeding dyscrasias despite the thrombocytopenia.  She did tell the NP student her date of birth correctly but could not give the present year. Pharmacy is concerned about her ongoing weight loss despite nutritional supplements.  Pharmacy questions whether weight loss may be exacerbated by donepezil as it can cause anorexia.  Physical exam:  Pertinent or positive findings: She was sleeping when I began the examination and at no point opened her eyes.  When I attempted to open her eyes she resisted and said "quit that".  That was her only verbal response; but resisted  when I attempted to pull back the blanket to listen to her chest . As I observed her sleeping she exhibited some snoring, some hypopnea, and possibly some apnea.  She is edentulous.  She is a mouth breather.  Oral mucosa is dry.  Pedal pulses are decreased.  The lower extremities are in booties.  There was no voluntary range of motion.  She did move the right upper extremity to pull the blanket back up.  When I attempted to check the left upper extremity for facility she resisted exhibiting good tone as she did in the right upper extremity.  She had punctate excoriated lesions over the anterior chest which did cross the midline slightly.  There were no vesicles present.  General appearance:  no acute distress, increased work of breathing is present.   Lymphatic: No lymphadenopathy about the head, neck, axilla. Eyes: No conjunctival inflammation or lid edema is present. There is no scleral icterus. Ears:  External ear exam shows no significant lesions or deformities.   Nose:  External nasal examination shows no deformity or inflammation. Nasal mucosa are pink and moist without lesions,  exudates Neck:  No thyromegaly, masses, tenderness noted.    Heart:  Normal rate and regular rhythm. S1 and S2 normal without gallop, murmur, click, rub .  Lungs: Chest clear to auscultation without wheezes, rhonchi, rales, rubs. Abdomen: Bowel sounds are normal. Abdomen is soft and nontender with no organomegaly, hernias, masses. GU: Deferred  Extremities:  No cyanosis, clubbing, edema  Neurologic exam : Balance, Rhomberg, finger to nose testing could not be completed due to clinical state Skin: Warm & dry w/o tenting.  See summary under each active problem in the Problem List with associated updated therapeutic plan

## 2019-11-08 NOTE — Assessment & Plan Note (Addendum)
With Covid temperature was noted to be as high as 100.7.  In the context of the protocol noted above; white count fell to 1400.

## 2019-11-09 LAB — HEMOGLOBIN A1C: Hemoglobin A1C: 5.9

## 2019-11-09 LAB — CBC AND DIFFERENTIAL
HCT: 32 — AB (ref 36–46)
Hemoglobin: 11 — AB (ref 12.0–16.0)
Neutrophils Absolute: 3
Platelets: 86 — AB (ref 150–399)
WBC: 4.5

## 2019-11-09 LAB — CBC: RBC: 3.31 — AB (ref 3.87–5.11)

## 2019-11-09 LAB — TSH: TSH: 2.11 (ref 0.41–5.90)

## 2019-11-29 ENCOUNTER — Encounter: Payer: Self-pay | Admitting: Adult Health

## 2019-11-29 ENCOUNTER — Non-Acute Institutional Stay (SKILLED_NURSING_FACILITY): Payer: Medicare Other | Admitting: Adult Health

## 2019-11-29 DIAGNOSIS — R627 Adult failure to thrive: Secondary | ICD-10-CM

## 2019-11-29 DIAGNOSIS — G309 Alzheimer's disease, unspecified: Secondary | ICD-10-CM

## 2019-11-29 DIAGNOSIS — I10 Essential (primary) hypertension: Secondary | ICD-10-CM

## 2019-11-29 DIAGNOSIS — E034 Atrophy of thyroid (acquired): Secondary | ICD-10-CM

## 2019-11-29 DIAGNOSIS — F0280 Dementia in other diseases classified elsewhere without behavioral disturbance: Secondary | ICD-10-CM

## 2019-11-29 NOTE — Progress Notes (Signed)
Location:  [facility] Room Number: 117-A Place of Service:  SNF (31) Provider:  Kenard Gower, DNP, FNP-BC  Patient Care Team: Pecola Lawless, MD as PCP - General (Internal Medicine) Medina-Vargas, Margit Banda, NP as Nurse Practitioner (Internal Medicine)  Extended Emergency Contact Information Primary Emergency Contact: [name],[name] Address: [address]          [place], [place] [address] Darden Amber of [place] Home Phone: [phone] Mobile Phone: [phone] Relation: Daughter  Code Status:  DNR  Goals of care: Advanced Directive information Advanced Directives 11/08/2019  Does Patient Have a Medical Advance Directive? Yes  Type of Advance Directive Out of facility DNR (pink MOST or yellow form)  Does patient want to make changes to medical advance directive? No - Patient declined  Copy of Healthcare Power of Attorney in Chart? -  Would patient like information on creating a medical advance directive? -  Pre-existing out of facility DNR order (yellow form or pink MOST form) Yellow form placed in chart (order not valid for inpatient use)     Chief Complaint  Patient presents with  . Medical Management of Chronic Issues    Routine [facility] visit    HPI:  Pt is an 86 y.o. female seen today for medical management of chronic diseases. She is a long-term care resident of [facility]. She has PMH of Alzheimer's disease, CVA, depression, hypertension, diabetes mellitus and hypothyroidism. She has gained 7.2 lbs in 1 month. Aricept was recently discontinued due to it having a side effect of poor appetite. She continues to have Mirtazapine for appetite. Latest wbc 3.9 (11/25/19). No reported fever, cough nor SOB. She remains to be on neutropenic precautions. BPs 110/53, 116/49, 110/43, 115/73, 109/58. She takes Lisinopril for hypertension.   Past Medical History:  Diagnosis Date  . Cerebrovascular disease   . Dementia  ([facility]CC)   . Depression   . DM type 2 (diabetes mellitus, type 2) (H[facility]) 07/02/2011  . Elevated LFTs   . Frequent falls   . HTN (hypertension) 07/02/2011  . Thrombocytopenia (HCC)   . UTI (lower urinary tract infection)    Past Surgical History:  Procedure Laterality Date  . ABDOMINAL HYSTERECTOMY    . CHOLECYSTECTOMY      No Known Allergies  Outpatient Encounter Medications as of 11/29/2019  Medication Sig  . acetaminophen (TYLENOL) 325 MG tablet Take 650 mg by mouth every 6 (six) hours as needed.  Marland Kitchen aspirin EC 81 MG tablet Take 81 mg by mouth 2 (two) times daily.   . bisacodyl (DULCOLAX) 10 MG suppository If not relieved by MOM, give 10 mg Bisacodyl suppositiory rectally X 1 dose in 24 hours as needed (Do not use constipation standing orders for residents with renal failure/CFR less than 30. Contact MD for orders) (Physician Order)  . levothyroxine (SYNTHROID, LEVOTHROID) 25 MCG tablet Take 50 mcg by mouth daily before breakfast.   . lisinopril (ZESTRIL) 10 MG tablet Take 10 mg by mouth every morning.   . loratadine (CLARITIN) 10 MG tablet Take 10 mg by mouth daily.  . magnesium hydroxide (MILK OF MAGNESIA) 400 MG/5ML suspension If no BM in 3 days, give 30 cc Milk of Magnesium p.o. x 1 dose in 24 hours as needed (Do not use standing constipation orders for residents with renal failure CFR less than 30. Contact MD for orders) (Physician Order)  . mirtazapine (REMERON) 15 MG tablet Take 15 mg by mouth at bedtime.  . Multiple Vitamin (MULTIVITAMIN  WITH MINERALS) TABS tablet Take 1 tablet by mouth 2 (two) times daily.  . NON FORMULARY Mech soft diet  . NON FORMULARY Take 1 each by mouth in the morning and at bedtime. Magic Cup  . Nutritional Supplement LIQD Take 120 mLs by mouth in the morning and at bedtime. MedPass  . sertraline (ZOLOFT) 25 MG tablet Take 50 mg by mouth every morning. For Depression  . Sodium Phosphates (RA SALINE ENEMA RE) If not relieved by Biscodyl suppository, give  disposable Saline Enema rectally X 1 dose/24 hrs as needed (Do not use constipation standing orders for residents with renal failure/CFR less than 30. Contact MD for orders)(Physician Or  . traMADol (ULTRAM) 50 MG tablet Take by mouth every 6 (six) hours as needed for severe pain.  . [DISCONTINUED] donepezil (ARICEPT) 5 MG tablet Take 5 mg by mouth at bedtime.    No facility-administered encounter medications on file as of 11/29/2019.    Review of Systems  Unable to obtain due to dementhia    Immunization History  Administered Date(s) Administered  . Influenza, High Dose Seasonal PF 06/29/2019  . Moderna SARS-COVID-2 Vaccination 10/10/2019  . Pneumococcal Polysaccharide-23 07/03/2011  . Tdap 10/22/2012   Pertinent  Health Maintenance Due  Topic Date Due  . FOOT EXAM  01/05/1942  . OPHTHALMOLOGY EXAM  01/05/1942  . DEXA SCAN  01/05/1999  . PNA vac Low Risk Adult (2 of 2 - PCV13) 07/02/2012  . HEMOGLOBIN A1C  05/08/2020  . INFLUENZA VACCINE  Completed   Fall Risk  04/20/2019  Falls in the past year? (No Data)  Comment Emmi Telephone Survey: data to providers prior to load  Number falls in past yr: (No Data)  Comment Emmi Telephone Survey Actual Response =      Vitals:   11/29/19 0949  BP: (!) 110/53  Pulse: 60  Resp: 18  Temp: 98.1 F (36.7 C)  TempSrc: Oral  Weight: 129 lb 6.4 oz (58.7 kg)  Height: 5\' 3"  (1.6 m)   Body mass index is 22.92 kg/m.  Physical Exam  GENERAL APPEARANCE: Well nourished. In no acute distress. Normal body habitus SKIN:  Skin is warm and dry.  MOUTH and THROAT: Lips are without lesions. Oral mucosa is moist and without lesions. Tongue is normal in shape, size, and color and without lesions RESPIRATORY: Breathing is even & unlabored, BS CTAB CARDIAC: RRR, no murmur,no extra heart sounds, no edema GI: Abdomen soft, normal BS, no masses, no tenderness EXTREMITIES:  Able to move X 4 extremities NEUROLOGICAL: There is no tremor. Speech is clear.  Alert to self, disoriented to time and place. PSYCHIATRIC:  Affect and behavior are appropriate  Labs reviewed: Recent Labs    07/11/19 1347 07/12/19 0520 10/24/19 0000  NA 138 141 138  K 4.0 4.1 4.5  CL 105 113* 102  CO2 22 22 25*  GLUCOSE 232* 150*  --   BUN 22 23 17   CREATININE 1.13* 0.81 0.6  CALCIUM 9.0 8.1* 9.0   Recent Labs    07/11/19 1347 07/12/19 0520  AST 39 25  ALT 31 22  ALKPHOS 139* 95  BILITOT 2.2* 1.2  PROT 6.1* 4.9*  ALBUMIN 3.5 2.8*   Recent Labs    07/11/19 1347 07/11/19 1347 07/12/19 0520 07/12/19 0520 07/26/19 0000 10/24/19 0000 11/09/19 0000  WBC 9.6   < > 5.2  --  3.4 1.4 4.5  NEUTROABS 8.8*  --   --   --   --   --  3  HGB 12.7   < > 9.9*   < > 9.7* 12.1 11.0*  HCT 39.3   < > 30.8*   < > 27* 35* 32*  MCV 97.8  --  98.7  --   --   --   --   PLT 69*   < > 40*   < > 144* 68* 86*   < > = values in this interval not displayed.   Lab Results  Component Value Date   TSH 2.11 11/09/2019   Lab Results  Component Value Date   HGBA1C 5.9 11/09/2019    Assessment/Plan  1. Hypothyroidism due to acquired atrophy of thyroid Lab Results  Component Value Date   TSH 2.11 11/09/2019   - continue Levothyroxine  2. Adult failure to thrive Wt Readings from Last 3 Encounters:  11/29/19 129 lb 6.4 oz (58.7 kg)  11/08/19 122 lb 3.2 oz (55.4 kg)  10/28/19 122 lb 12.8 oz (55.7 kg)   - gained 7.2 lbs in a month, continue Multivitamin and Remeron - continue magic cup and med pass for supplementation  3. Essential hypertension - controlled, continue Lisinopril  4. Alzheimer's dementia without behavioral disturbance, unspecified timing of dementia onset (HCC) - Aricept was recently discontinued due to side effect of poor appetite - continue supportive care and fall precautions   Family/ staff Communication:  Discussed plan of care with resident.  Labs/tests ordered:  None  Goals of care:   Long-term care.   Kenard Gower, DNP,  FNP-BC [facility] [phone] (Monday-Friday 8:00 a.m. - 5:00 p.m.) [phone] (after hours)

## 2019-12-29 ENCOUNTER — Non-Acute Institutional Stay (SKILLED_NURSING_FACILITY): Payer: Medicare Other | Admitting: Adult Health

## 2019-12-29 ENCOUNTER — Encounter: Payer: Self-pay | Admitting: Adult Health

## 2019-12-29 DIAGNOSIS — G309 Alzheimer's disease, unspecified: Secondary | ICD-10-CM | POA: Diagnosis not present

## 2019-12-29 DIAGNOSIS — R630 Anorexia: Secondary | ICD-10-CM

## 2019-12-29 DIAGNOSIS — I10 Essential (primary) hypertension: Secondary | ICD-10-CM

## 2019-12-29 DIAGNOSIS — E034 Atrophy of thyroid (acquired): Secondary | ICD-10-CM

## 2019-12-29 DIAGNOSIS — F0280 Dementia in other diseases classified elsewhere without behavioral disturbance: Secondary | ICD-10-CM

## 2019-12-29 NOTE — Progress Notes (Signed)
Location:  [place] Room Number: 102-B Place of Service:  SNF (31) Provider:  Durenda Age, DNP, FNP-BC  Patient Care Team: Hendricks Limes, MD as PCP - General (Internal Medicine) Medina-Vargas, Senaida Lange, NP as Nurse Practitioner (Internal Medicine)  Extended Emergency Contact Information Primary Emergency Contact: [name],[name] Address: [address]          [place], [place] [address] Johnnette Litter of [place] Phone: [phone] Mobile Phone: [phone] Relation: Daughter  Code Status:  DNR  Goals of care: Advanced Directive information Advanced Directives 11/29/2019  Does Patient Have a Medical Advance Directive? Yes  Type of Advance Directive Out of facility DNR (pink MOST or yellow form)  Does patient want to make changes to medical advance directive? No - Patient declined  Copy of [place] in Chart? -  Would patient like information on creating a medical advance directive? -  Pre-existing out of facility DNR order (yellow form or pink MOST form) Yellow form placed in chart (order not valid for inpatient use)     Chief Complaint  Patient presents with  . Medical Management of Chronic Issues    Routine [facility] visit  . Quality Metric Gaps    Needs diabetic foot and eye exams.   . Immunizations    Needs Prevnar    HPI:  Pt is an 86 y.o. female seen today for medical management of chronic diseases. She is a long-term care resident of [facility]. She has PMH of Alzheimer's disease, CVA, depression, hypertension, diabetes mellitus and  hypothyroidism. Latest weight 128.6 lbs, had 0.6 lbs weight loss from last month. SBPs range is 102 to 129 lbs. She is currently taking Lisinopril for hypertension. Latest tsh 2.11, 11/09/19. She takes Levothyroxine for her hypothyroidism.   Past Medical History:  Diagnosis Date  . Cerebrovascular disease   . Dementia ([place])   . Depression   . DM type  2 (diabetes mellitus, type 2) ([place]) 07/02/2011  . Elevated LFTs   . Frequent falls   . HTN (hypertension) 07/02/2011  . Thrombocytopenia ([place])   . UTI (lower urinary tract infection)    Past Surgical History:  Procedure Laterality Date  . ABDOMINAL HYSTERECTOMY    . CHOLECYSTECTOMY      No Known Allergies  Outpatient Encounter Medications as of 12/29/2019  Medication Sig  . acetaminophen (TYLENOL) 325 MG tablet Take 650 mg by mouth every 6 (six) hours as needed.  Marland Kitchen aspirin EC 81 MG tablet Take 81 mg by mouth 2 (two) times daily.   . bisacodyl (DULCOLAX) 10 MG suppository If not relieved by MOM, give 10 mg Bisacodyl suppositiory rectally X 1 dose in 24 hours as needed (Do not use constipation standing orders for residents with renal failure/CFR less than 30. Contact MD for orders) (Physician Order)  . levothyroxine (SYNTHROID, LEVOTHROID) 25 MCG tablet Take 50 mcg by mouth daily before breakfast.   . lisinopril (ZESTRIL) 10 MG tablet Take 10 mg by mouth every morning.   . loratadine (CLARITIN) 10 MG tablet Take 10 mg by mouth daily.  . magnesium hydroxide (MILK OF MAGNESIA) 400 MG/5ML suspension If no BM in 3 days, give 30 cc Milk of Magnesium p.o. x 1 dose in 24 hours as needed (Do not use standing constipation orders for residents with renal failure CFR less than 30. Contact MD for orders) (Physician Order)  . mirtazapine (REMERON) 15 MG tablet Take 15 mg by mouth at bedtime.  Marland Kitchen  Multiple Vitamin (MULTIVITAMIN WITH MINERALS) TABS tablet Take 1 tablet by mouth 2 (two) times daily.  . NON FORMULARY Mech soft diet  . NON FORMULARY Take 1 each by mouth in the morning and at bedtime. Magic Cup  . Nutritional Supplement LIQD Take 120 mLs by mouth in the morning and at bedtime. MedPass  . sertraline (ZOLOFT) 50 MG tablet Take 50 mg by mouth daily.  . Sodium Phosphates (RA SALINE ENEMA RE) If not relieved by Biscodyl suppository, give disposable Saline Enema rectally X 1 dose/24 hrs as needed (Do  not use constipation standing orders for residents with renal failure/CFR less than 30. Contact MD for orders)(Physician Or  . traMADol (ULTRAM) 50 MG tablet Take by mouth every 6 (six) hours as needed for severe pain.  . [DISCONTINUED] sertraline (ZOLOFT) 25 MG tablet Take 50 mg by mouth every morning. For Depression   No facility-administered encounter medications on file as of 12/29/2019.    Review of Systems  Unable to obtain due to dementia    Immunization History  Administered Date(s) Administered  . Influenza, High Dose Seasonal PF 06/29/2019  . Moderna SARS-COVID-2 Vaccination 10/10/2019  . Pneumococcal Polysaccharide-23 07/03/2011  . Tdap 10/22/2012   Pertinent  Health Maintenance Due  Topic Date Due  . FOOT EXAM  Never done  . OPHTHALMOLOGY EXAM  Never done  . DEXA SCAN  Never done  . PNA vac Low Risk Adult (2 of 2 - PCV13) 07/02/2012  . INFLUENZA VACCINE  04/22/2020  . HEMOGLOBIN A1C  05/08/2020   Fall Risk  04/20/2019  Falls in the past year? (No Data)  Comment Emmi Telephone Survey: data to providers prior to load  Number falls in past yr: (No Data)  Comment Emmi Telephone Survey Actual Response =      Vitals:   12/29/19 0947  BP: (!) 105/52  Pulse: 64  Resp: 18  Temp: 98.8 F (37.1 C)  TempSrc: Oral  Weight: 128 lb 9.6 oz (58.3 kg)  Height: 5\' 3"  (1.6 m)   Body mass index is 22.78 kg/m.  Physical Exam  GENERAL APPEARANCE: Well nourished. In no acute distress. Normal body habitus SKIN:  Skin is warm and dry.  MOUTH and THROAT: Lips are without lesions. Oral mucosa is moist and without lesions. Tongue is normal in shape, size, and color and without lesions RESPIRATORY: Breathing is even & unlabored, BS CTAB CARDIAC: RRR, no murmur,no extra heart sounds, no edema GI: Abdomen soft, normal BS, no masses, no tenderness NEUROLOGICAL: There is no tremor. Speech is clear. Alert to self, disoriented to time and place. PSYCHIATRIC:  Affect and behavior are  appropriate  Labs reviewed: Recent Labs    07/11/19 1347 07/12/19 0520 10/24/19 0000  NA 138 141 138  K 4.0 4.1 4.5  CL 105 113* 102  CO2 22 22 25*  GLUCOSE 232* 150*  --   BUN 22 23 17   CREATININE 1.13* 0.81 0.6  CALCIUM 9.0 8.1* 9.0   Recent Labs    07/11/19 1347 07/12/19 0520  AST 39 25  ALT 31 22  ALKPHOS 139* 95  BILITOT 2.2* 1.2  PROT 6.1* 4.9*  ALBUMIN 3.5 2.8*   Recent Labs    07/11/19 1347 07/11/19 1347 07/12/19 0520 07/12/19 0520 07/26/19 0000 10/24/19 0000 11/09/19 0000  WBC 9.6   < > 5.2  --  3.4 1.4 4.5  NEUTROABS 8.8*  --   --   --   --   --  3  HGB 12.7   < > 9.9*   < > 9.7* 12.1 11.0*  HCT 39.3   < > 30.8*   < > 27* 35* 32*  MCV 97.8  --  98.7  --   --   --   --   PLT 69*   < > 40*   < > 144* 68* 86*   < > = values in this interval not displayed.   Lab Results  Component Value Date   TSH 2.11 11/09/2019   Lab Results  Component Value Date   HGBA1C 5.9 11/09/2019    Assessment/Plan  1. Essential hypertension - controlled, continue Lisinopril  2. Hypothyroidism due to acquired atrophy of thyroid Lab Results  Component Value Date   TSH 2.11 11/09/2019   - continue  Levothyroxine  3. Poor appetite  weight 128.6 lbs Body mass index is 22.78 kg/m. - continue Mirtazapine  4. Alzheimer's dementia without behavioral disturbance, unspecified timing of dementia onset (HCC) - continue supportive care, fall precautions     Family/ staff Communication:  Discussed plan of care with resident and charge nurse.  Labs/tests ordered:  None  Goals of care:   Long-term care   Kenard Gower, DNP, FNP-BC [facility] [phone] (Monday-Friday 8:00 a.m. - 5:00 p.m.) [phone] (after hours)

## 2020-01-04 ENCOUNTER — Encounter: Payer: Self-pay | Admitting: Adult Health

## 2020-01-04 ENCOUNTER — Non-Acute Institutional Stay (SKILLED_NURSING_FACILITY): Payer: Medicare Other | Admitting: Adult Health

## 2020-01-04 DIAGNOSIS — I10 Essential (primary) hypertension: Secondary | ICD-10-CM

## 2020-01-04 DIAGNOSIS — F339 Major depressive disorder, recurrent, unspecified: Secondary | ICD-10-CM

## 2020-01-04 DIAGNOSIS — E034 Atrophy of thyroid (acquired): Secondary | ICD-10-CM | POA: Diagnosis not present

## 2020-01-04 DIAGNOSIS — Z7189 Other specified counseling: Secondary | ICD-10-CM | POA: Diagnosis not present

## 2020-01-04 DIAGNOSIS — R630 Anorexia: Secondary | ICD-10-CM

## 2020-01-04 NOTE — Progress Notes (Signed)
Location:  [place] Room Number: 102-B Place of Service:  SNF (31) Provider:  Durenda Age, DNP, FNP-BC  Patient Care Team: Hendricks Limes, MD as PCP - General (Internal Medicine) Medina-Vargas, Senaida Lange, NP as Nurse Practitioner (Internal Medicine)  Extended Emergency Contact Information Primary Emergency Contact: [name],[name] Address: [address]          [place], [place] [address] Johnnette Litter of [place] Phone: [phone] Mobile Phone: [phone] Relation: Daughter  Code Status:  DNR  Goals of care: Advanced Directive information Advanced Directives 11/29/2019  Does Patient Have a Medical Advance Directive? Yes  Type of Advance Directive Out of facility DNR (pink MOST or yellow form)  Does patient want to make changes to medical advance directive? No - Patient declined  Copy of [place] in Chart? -  Would patient like information on creating a medical advance directive? -  Pre-existing out of facility DNR order (yellow form or pink MOST form) Yellow form placed in chart (order not valid for inpatient use)     Chief Complaint  Patient presents with  . Advanced Directive    Patient is seen for a care plan meeting.     HPI:  Pt is an 86 y.o. female who had advance care plan meeting attended by social worker, MDS coordinator, activity director, NP and daughter, Leilani Able, who attended via telephone conference. Resident was invited but declined. She remains to be full code. Discussed medications, vital signs and weights. Latest weight is 128.6 lbs, stabilizing. SBPs ranging from 98-110. She is currently taking Lisinopril 10 mg daily. Discussed that Lisinopril will be decreased to 5 mg daily since BPs are low.  Activity director reported that she mostly like a one-on-one activity. Daughter recalled that she did the same at the other facility before. No fall incident was reported. She is a long-term care resident of  [facility]. She has a PMH of Alzheimer's disease, CVA, depression, hypertension, diabetes mellitus and hypothyroidism. The meeting lasted for 20 minutes.    Past Medical History:  Diagnosis Date  . Cerebrovascular disease   . Dementia ([place])   . Depression   . DM type 2 (diabetes mellitus, type 2) ([place]) 07/02/2011  . Elevated LFTs   . Frequent falls   . HTN (hypertension) 07/02/2011  . Thrombocytopenia ([place])   . UTI (lower urinary tract infection)    Past Surgical History:  Procedure Laterality Date  . ABDOMINAL HYSTERECTOMY    . CHOLECYSTECTOMY      No Known Allergies  Outpatient Encounter Medications as of 01/04/2020  Medication Sig  . acetaminophen (TYLENOL) 325 MG tablet Take 650 mg by mouth every 6 (six) hours as needed.  Marland Kitchen aspirin EC 81 MG tablet Take 81 mg by mouth 2 (two) times daily.   . bisacodyl (DULCOLAX) 10 MG suppository If not relieved by MOM, give 10 mg Bisacodyl suppositiory rectally X 1 dose in 24 hours as needed (Do not use constipation standing orders for residents with renal failure/CFR less than 30. Contact MD for orders) (Physician Order)  . levothyroxine (SYNTHROID, LEVOTHROID) 25 MCG tablet Take 50 mcg by mouth daily before breakfast.   . lisinopril (ZESTRIL) 10 MG tablet Take 10 mg by mouth every morning.   . loratadine (CLARITIN) 10 MG tablet Take 10 mg by mouth daily.  . magnesium hydroxide (MILK OF MAGNESIA) 400 MG/5ML suspension If no BM in 3 days, give 30 cc Milk of Magnesium p.o. x 1  dose in 24 hours as needed (Do not use standing constipation orders for residents with renal failure CFR less than 30. Contact MD for orders) (Physician Order)  . mirtazapine (REMERON) 15 MG tablet Take 15 mg by mouth at bedtime.  . Multiple Vitamin (MULTIVITAMIN WITH MINERALS) TABS tablet Take 1 tablet by mouth 2 (two) times daily.  . NON FORMULARY Mech soft diet  . NON FORMULARY Take 1 each by mouth in the morning and at bedtime. Magic Cup  .  Nutritional Supplement LIQD Take 120 mLs by mouth in the morning and at bedtime. MedPass  . sertraline (ZOLOFT) 50 MG tablet Take 50 mg by mouth daily.  . Sodium Phosphates (RA SALINE ENEMA RE) If not relieved by Biscodyl suppository, give disposable Saline Enema rectally X 1 dose/24 hrs as needed (Do not use constipation standing orders for residents with renal failure/CFR less than 30. Contact MD for orders)(Physician Or  . traMADol (ULTRAM) 50 MG tablet Take by mouth every 6 (six) hours as needed for severe pain.   No facility-administered encounter medications on file as of 01/04/2020.    Review of Systems  Unable to obtain due to dementia.    Immunization History  Administered Date(s) Administered  . Influenza, High Dose Seasonal PF 06/29/2019  . Moderna SARS-COVID-2 Vaccination 10/10/2019  . Pneumococcal Polysaccharide-23 07/03/2011  . Tdap 10/22/2012   Pertinent  Health Maintenance Due  Topic Date Due  . FOOT EXAM  Never done  . OPHTHALMOLOGY EXAM  Never done  . DEXA SCAN  Never done  . PNA vac Low Risk Adult (2 of 2 - PCV13) 07/02/2012  . INFLUENZA VACCINE  04/22/2020  . HEMOGLOBIN A1C  05/08/2020   Fall Risk  04/20/2019  Falls in the past year? (No Data)  Comment Emmi Telephone Survey: data to providers prior to load  Number falls in past yr: (No Data)  Comment Emmi Telephone Survey Actual Response =      Vitals:   01/04/20 0853  BP: (!) 101/52  Pulse: 68  Resp: 18  Temp: 98.1 F (36.7 C)  TempSrc: Oral  Weight: 128 lb 9.6 oz (58.3 kg)  Height: 5\' 3"  (1.6 m)   Body mass index is 22.78 kg/m.  Physical Exam  GENERAL APPEARANCE: Well nourished. In no acute distress. Normal body habitus SKIN:  Skin is warm and dry.  MOUTH and THROAT: Lips are without lesions. Oral mucosa is moist and without lesions.  RESPIRATORY: Breathing is even & unlabored, BS CTAB CARDIAC: RRR, no murmur,no extra heart sounds, no edema GI: Abdomen soft, normal BS, no masses, no  tenderness NEUROLOGICAL: There is no tremor. Speech is clear. Alert to self, disoriented to time and place. PSYCHIATRIC:  Affect and behavior are appropriate  Labs reviewed: Recent Labs    07/11/19 1347 07/12/19 0520 10/24/19 0000  NA 138 141 138  K 4.0 4.1 4.5  CL 105 113* 102  CO2 22 22 25*  GLUCOSE 232* 150*  --   BUN 22 23 17   CREATININE 1.13* 0.81 0.6  CALCIUM 9.0 8.1* 9.0   Recent Labs    07/11/19 1347 07/12/19 0520  AST 39 25  ALT 31 22  ALKPHOS 139* 95  BILITOT 2.2* 1.2  PROT 6.1* 4.9*  ALBUMIN 3.5 2.8*   Recent Labs    07/11/19 1347 07/11/19 1347 07/12/19 0520 07/12/19 0520 07/26/19 0000 10/24/19 0000 11/09/19 0000  WBC 9.6   < > 5.2  --  3.4 1.4 4.5  NEUTROABS 8.8*  --   --   --   --   --  3  HGB 12.7   < > 9.9*   < > 9.7* 12.1 11.0*  HCT 39.3   < > 30.8*   < > 27* 35* 32*  MCV 97.8  --  98.7  --   --   --   --   PLT 69*   < > 40*   < > 144* 68* 86*   < > = values in this interval not displayed.   Lab Results  Component Value Date   TSH 2.11 11/09/2019   Lab Results  Component Value Date   HGBA1C 5.9 11/09/2019    Assessment/Plan  1. Advance care planning - remains to be DNR - discussed medications, vital signs and weights  2. Essential hypertension - BPs low, will decrease Lisinopril from 10 mg to 5 mg daily - monitor BPs  3. Hypothyroidism due to acquired atrophy of thyroid Lab Results  Component Value Date   TSH 2.11 11/09/2019   - continue Levothyroxine 50 mcg 1 tab daily  4. Major depression, recurrent, chronic (HCC) - enjoys one-on-one activity, continue Sertraline 50 mg daily  5. Poor appetite Wt Readings from Last 3 Encounters:  01/04/20 128 lb 9.6 oz (58.3 kg)  12/29/19 128 lb 9.6 oz (58.3 kg)  11/29/19 129 lb 6.4 oz (58.7 kg)   - weight is stable, continue Mirtazapine 15 mg Q HS    Family/ staff Communication:  Discussed plan of care with daughter and IDT  Labs/tests ordered:  None  Goals of care:    Long-term care   Kenard Gower, DNP, FNP-BC [facility] [phone] (Monday-Friday 8:00 a.m. - 5:00 p.m.) [phone] (after hours)

## 2020-02-08 ENCOUNTER — Encounter: Payer: Self-pay | Admitting: Internal Medicine

## 2020-02-08 ENCOUNTER — Non-Acute Institutional Stay (SKILLED_NURSING_FACILITY): Payer: Medicare Other | Admitting: Internal Medicine

## 2020-02-08 DIAGNOSIS — I10 Essential (primary) hypertension: Secondary | ICD-10-CM

## 2020-02-08 DIAGNOSIS — E034 Atrophy of thyroid (acquired): Secondary | ICD-10-CM

## 2020-02-08 DIAGNOSIS — D696 Thrombocytopenia, unspecified: Secondary | ICD-10-CM

## 2020-02-08 NOTE — Assessment & Plan Note (Signed)
11/09/2019 platelet count 86,000.  No bleeding dyscrasias reported.

## 2020-02-08 NOTE — Assessment & Plan Note (Addendum)
Blood pressure is actually low at 109/57 on very low-dose ACE inhibitor.  This agent may have renal protection & will be continued with BP monitor

## 2020-02-08 NOTE — Assessment & Plan Note (Addendum)
11/09/2019 TSH was therapeutic at 2.11

## 2020-02-08 NOTE — Progress Notes (Signed)
   NURSING HOME LOCATION:  [facility] ROOM NUMBER:  102-B  CODE STATUS:  DNR  PCP:  Pecola Lawless, MD  [address] [place] [address]  This is a nursing facility follow up of chronic medical diagnoses.  Interim medical record and care since last [facility] visit was updated with review of diagnostic studies and change in clinical status since last visit were documented.  HPI: She is a permanent resident facility with medical diagnoses of hypothyroidism, essential hypertension, history of thrombocytopenia, non-insulin-dependent diabetes, chronic depression, and dementia most likely of vascular etiology.  Anemia had improved but did drop slightly as of 2/17 with hemoglobin 11/hematocrit 32.  Platelet count was 86,000; no bleeding dyscrasias reported.  On 2/1 renal function and other chemistries were normal.  She is on tramadol every 6 hours as needed as well as sertraline 50 mg daily.  There is been no staff reports to suggest serotonin syndrome.  Review of systems: Dementia invalidated responses. Date given as November 18th, 20??.  She denied any specific complaints.  Constitutional: No fever, significant weight change, fatigue  Eyes: No redness, discharge, pain, vision change ENT/mouth: No nasal congestion,  purulent discharge, earache, change in hearing, sore throat  Cardiovascular: No chest pain, palpitations, paroxysmal nocturnal dyspnea, claudication, edema  Respiratory: No cough, sputum production, hemoptysis, DOE, significant snoring, apnea   Gastrointestinal: No heartburn, dysphagia, abdominal pain, nausea /vomiting, rectal bleeding, melena, change in bowels Genitourinary: No dysuria, hematuria, pyuria, incontinence, nocturia Musculoskeletal: No joint stiffness, joint swelling, weakness, pain Dermatologic: No rash, pruritus, change in appearance of skin Neurologic: No dizziness, headache, syncope, seizures, numbness, tingling Psychiatric: No  significant anxiety, depression, insomnia, anorexia Endocrine: No change in hair/skin/nails, excessive thirst, excessive hunger, excessive urination  Hematologic/lymphatic: No significant bruising, lymphadenopathy, abnormal bleeding Allergy/immunology: No itchy/watery eyes, significant sneezing, urticaria, angioedema  Physical exam:  Pertinent or positive findings: It was after 2 PM and she was still asleep.  Ptosis is greater on the left than the right making the left eye appears smaller.  An angioma of the left upper eyelid medially is present. She is edentulous. She is hard of hearing.  Heart sounds are distant.  Breath sounds are decreased at the bases.  The feet are in booties.  The right heel is dressed.  Pedal pulses are not palpable.  When the feet are stimulated the toes are upgoing.  She is profoundly weak to opposition in all extremities, especially the lower extremities.  General appearance: no acute distress, increased work of breathing is present.   Lymphatic: No lymphadenopathy about the head, neck, axilla. Eyes: No conjunctival inflammation or lid edema is present. There is no scleral icterus. Ears:  External ear exam shows no significant lesions or deformities.   Nose:  External nasal examination shows no deformity or inflammation. Nasal mucosa are pink and moist without lesions, exudates Neck:  No thyromegaly, masses, tenderness noted.    Heart:  Normal rate and regular rhythm. S1 and S2 normal without gallop, murmur, click, rub .  Lungs:  without wheezes, rhonchi, rales, rubs. Abdomen: Bowel sounds are normal. Abdomen is soft and nontender with no organomegaly, hernias, masses. GU: Deferred  Extremities:  No cyanosis, clubbing, edema  Neurologic exam :Balance, Rhomberg, finger to nose testing could not be completed due to clinical state Skin: Warm & dry w/o tenting. No significant rash.  See summary under each active problem in the Problem List with associated updated  therapeutic plan

## 2020-02-08 NOTE — Patient Instructions (Signed)
See assessment and plan under each diagnosis in the problem list and acutely for this visit 

## 2020-03-02 ENCOUNTER — Non-Acute Institutional Stay (SKILLED_NURSING_FACILITY): Payer: Medicare Other | Admitting: Adult Health

## 2020-03-02 ENCOUNTER — Encounter: Payer: Self-pay | Admitting: Adult Health

## 2020-03-02 DIAGNOSIS — F339 Major depressive disorder, recurrent, unspecified: Secondary | ICD-10-CM | POA: Diagnosis not present

## 2020-03-02 DIAGNOSIS — E034 Atrophy of thyroid (acquired): Secondary | ICD-10-CM

## 2020-03-02 DIAGNOSIS — I10 Essential (primary) hypertension: Secondary | ICD-10-CM | POA: Diagnosis not present

## 2020-03-02 DIAGNOSIS — R7303 Prediabetes: Secondary | ICD-10-CM

## 2020-03-02 DIAGNOSIS — R630 Anorexia: Secondary | ICD-10-CM

## 2020-03-02 DIAGNOSIS — G309 Alzheimer's disease, unspecified: Secondary | ICD-10-CM

## 2020-03-02 DIAGNOSIS — F0280 Dementia in other diseases classified elsewhere without behavioral disturbance: Secondary | ICD-10-CM

## 2020-03-02 NOTE — Progress Notes (Signed)
Location:  [facility] Room Number: 102/B Place of Service:  SNF (31) Provider:  Kenard Gower, DNP, FNP-BC  Patient Care Team: Pecola Lawless, MD as PCP - General (Internal Medicine) Medina-Vargas, Margit Banda, NP as Nurse Practitioner (Internal Medicine)  Extended Emergency Contact Information Primary Emergency Contact: [name],[name] Address: [address]          [place], [place] [address] Darden Amber of [place] Home Phone: [phone] Mobile Phone: [phone] Relation: Daughter  Code Status:  DNR  Goals of care: Advanced Directive information Advanced Directives 03/02/2020  Does Patient Have a Medical Advance Directive? Yes  Type of Advance Directive Out of facility DNR (pink MOST or yellow form)  Does patient want to make changes to medical advance directive? No - Patient declined  Copy of Healthcare Power of Attorney in Chart? -  Would patient like information on creating a medical advance directive? -  Pre-existing out of facility DNR order (yellow form or pink MOST form) Yellow form placed in chart (order not valid for inpatient use)     Chief Complaint  Patient presents with   Medical Management of Chronic Issues    Routine visit of medical management    HPI:  Pt is a 86 y.o. female seen today for medical management of chronic diseases. She has a PMH of Alzheimer's disease, CVA, depression, hypertension, diabetes mellitus and hypothyroidism. She gained 8.6 lbs in a month. She continuous to take Remeron as appetite stimulant. SBPs ranging from 102 to 119. Currently takes Lisinopril 5 mg daily for hypertension.   Past Medical History:  Diagnosis Date   Cerebrovascular disease    Dementia (HCC)    Depression    DM type 2 (diabetes mellitus, type 2) (HCC) 07/02/2011   Elevated LFTs    Frequent falls    HTN (hypertension) 07/02/2011   Thrombocytopenia (HCC)    UTI (lower urinary tract infection)    Past Surgical History:   Procedure Laterality Date   ABDOMINAL HYSTERECTOMY     CHOLECYSTECTOMY      No Known Allergies  Outpatient Encounter Medications as of 03/02/2020  Medication Sig   acetaminophen (TYLENOL) 325 MG tablet Take 650 mg by mouth every 6 (six) hours as needed.   aspirin EC 81 MG tablet Take 81 mg by mouth 2 (two) times daily.    bisacodyl (DULCOLAX) 10 MG suppository If not relieved by MOM, give 10 mg Bisacodyl suppositiory rectally X 1 dose in 24 hours as needed (Do not use constipation standing orders for residents with renal failure/CFR less than 30. Contact MD for orders) (Physician Order)   levothyroxine (SYNTHROID) 50 MCG tablet Take 50 mcg by mouth daily before breakfast.   lisinopril (ZESTRIL) 5 MG tablet Take 5 mg by mouth daily.   magnesium hydroxide (MILK OF MAGNESIA) 400 MG/5ML suspension If no BM in 3 days, give 30 cc Milk of Magnesium p.o. x 1 dose in 24 hours as needed (Do not use standing constipation orders for residents with renal failure CFR less than 30. Contact MD for orders) (Physician Order)   mirtazapine (REMERON) 15 MG tablet Take 15 mg by mouth at bedtime.   Multiple Vitamin (MULTIVITAMIN WITH MINERALS) TABS tablet Take 1 tablet by mouth 2 (two) times daily.   NON FORMULARY Mech soft diet   NON FORMULARY Take 1 each by mouth in the morning and at bedtime. Magic Cup   Nutritional Supplement LIQD Take 120 mLs by mouth daily. MedPass   sertraline (ZOLOFT) 50  MG tablet Take 50 mg by mouth daily.   Sodium Phosphates (RA SALINE ENEMA RE) If not relieved by Biscodyl suppository, give disposable Saline Enema rectally X 1 dose/24 hrs as needed (Do not use constipation standing orders for residents with renal failure/CFR less than 30. Contact MD for orders)(Physician Or   traMADol (ULTRAM) 50 MG tablet Take by mouth every 6 (six) hours as needed for severe pain.   [DISCONTINUED] loratadine (CLARITIN) 10 MG tablet Take 10 mg by mouth daily.   No  facility-administered encounter medications on file as of 03/02/2020.    Review of Systems  Unable to obtain due to dementia    Immunization History  Administered Date(s) Administered   Influenza, High Dose Seasonal PF 06/29/2019   Moderna SARS-COVID-2 Vaccination 10/10/2019, 11/07/2019   Pneumococcal Polysaccharide-23 07/03/2011   Tdap 10/22/2012   Pertinent  Health Maintenance Due  Topic Date Due   FOOT EXAM  Never done   OPHTHALMOLOGY EXAM  Never done   PNA vac Low Risk Adult (2 of 2 - PCV13) 07/02/2012   INFLUENZA VACCINE  04/22/2020   HEMOGLOBIN A1C  05/08/2020   DEXA SCAN  Discontinued   Fall Risk  04/20/2019  Falls in the past year? (No Data)  Comment Emmi Telephone Survey: data to providers prior to load  Number falls in past yr: (No Data)  Comment Emmi Telephone Survey Actual Response =      Vitals:   03/02/20 1534  BP: (!) 102/58  Pulse: 87  Resp: 18  Temp: 98.7 F (37.1 C)  TempSrc: Oral  SpO2: 97%  Weight: 140 lb (63.5 kg)  Height: 5\' 3"  (1.6 m)   Body mass index is 24.8 kg/m.  Physical Exam  GENERAL APPEARANCE: Well nourished. In no acute distress. Normal body habitus SKIN:  Skin is warm and dry.  MOUTH and THROAT: Lips are without lesions. Oral mucosa is moist and without lesions.  RESPIRATORY: Breathing is even & unlabored, BS CTAB CARDIAC: RRR, no murmur,no extra heart sounds, no edema GI: Abdomen soft, normal BS, no masses, no tenderness NEUROLOGICAL: There is no tremor. Speech is clear. Alert to self, disoriented to time and place PSYCHIATRIC:  Affect and behavior are appropriate  Labs reviewed: Recent Labs    07/11/19 1347 07/12/19 0520 10/24/19 0000  NA 138 141 138  K 4.0 4.1 4.5  CL 105 113* 102  CO2 22 22 25*  GLUCOSE 232* 150*  --   BUN 22 23 17   CREATININE 1.13* 0.81 0.6  CALCIUM 9.0 8.1* 9.0   Recent Labs    07/11/19 1347 07/12/19 0520  AST 39 25  ALT 31 22  ALKPHOS 139* 95  BILITOT 2.2* 1.2  PROT 6.1*  4.9*  ALBUMIN 3.5 2.8*   Recent Labs    07/11/19 1347 07/11/19 1347 07/12/19 0520 07/12/19 0520 07/26/19 0000 10/24/19 0000 11/09/19 0000  WBC 9.6   < > 5.2  --  3.4 1.4 4.5  NEUTROABS 8.8*  --   --   --   --   --  3  HGB 12.7   < > 9.9*   < > 9.7* 12.1 11.0*  HCT 39.3   < > 30.8*   < > 27* 35* 32*  MCV 97.8  --  98.7  --   --   --   --   PLT 69*   < > 40*   < > 144* 68* 86*   < > = values in this interval not displayed.  Lab Results  Component Value Date   TSH 2.11 11/09/2019   Lab Results  Component Value Date   HGBA1C 5.9 11/09/2019    Assessment/Plan  1. Prediabetes Lab Results  Component Value Date   HGBA1C 5.9 11/09/2019   - will monitor - currently on appetite stimulant, Remeron  2. Essential hypertension - controlled, continue lisinopril  3. Hypothyroidism due to acquired atrophy of thyroid Lab Results  Component Value Date   TSH 2.11 11/09/2019   -Continue levothyroxine  4. Major depression, recurrent, chronic (HCC) -Stable, continue sertraline  5. Poor appetite -Improved, continue 8.6 lbs in a month -Continue Remeron, multivitamin and med Pass  6. Alzheimer's dementia without behavioral disturbance, unspecified timing of dementia onset (HCC) -Continue supportive care and fall precautions    Family/ staff Communication:  Discussed plan of care with charge nurse.  Labs/tests ordered:  None  Goals of care:   Long-term care   Kenard Gower, DNP, FNP-BC [facility] and [facility] [phone] (Monday-Friday 8:00 a.m. - 5:00 p.m.) [phone] (after hours)

## 2020-03-23 ENCOUNTER — Encounter: Payer: Self-pay | Admitting: Adult Health

## 2020-03-23 ENCOUNTER — Non-Acute Institutional Stay (SKILLED_NURSING_FACILITY): Payer: Medicare Other | Admitting: Adult Health

## 2020-03-23 DIAGNOSIS — I10 Essential (primary) hypertension: Secondary | ICD-10-CM | POA: Diagnosis not present

## 2020-03-23 DIAGNOSIS — B372 Candidiasis of skin and nail: Secondary | ICD-10-CM | POA: Diagnosis not present

## 2020-03-23 DIAGNOSIS — R630 Anorexia: Secondary | ICD-10-CM | POA: Diagnosis not present

## 2020-03-23 NOTE — Progress Notes (Signed)
Location:  [facility] Room Number: 102-B Place of Service:  SNF (31) Provider:  Kenard Gower, DNP, FNP-BC  Patient Care Team: Pecola Lawless, MD as PCP - General (Internal Medicine) Medina-Vargas, Margit Banda, NP as Nurse Practitioner (Internal Medicine)  Extended Emergency Contact Information Primary Emergency Contact: [name],[name] Address: [address]          [place], [place] [address] D[place] of [place] Home Phone: [phone] Mobile Phone: [phone] Relation: Daughter  Code Status:  DNR  Goals of care: Advanced Directive information Advanced Directives 03/02/2020  Does Patient Have a Medical Advance Directive? Yes  Type of Advance Directive Out of facility DNR (pink MOST or yellow form)  Does patient want to make changes to medical advance directive? No - Patient declined  Copy of Healthcare Power of Attorney in Chart? -  Would patient like information on creating a medical advance directive? -  Pre-existing out of facility DNR order (yellow form or pink MOST form) Yellow form placed in chart (order not valid for inpatient use)     Chief Complaint  Patient presents with  . Acute Visit    Patient is seen for rashes on bilateral breasts    HPI:  Pt is an 86 y.o. female seen for rashes under bilateral breasts.  She is a long-term care resident of [facility].  She has a PMH of Alzheimer's disease, CVA, depression, hypertension, diabetes mellitus and hypothyroidism.  She was seen in her room today.  Noted under bilateral breast moderate amount of erythema close rashes.  Area is most.  Resident complains that area is erythematous.  SBPs ranging from 106-128 she currently takes lisinopril for hypertension.  Latest weight is 140 lbs, gained 8.6 pounds in a month.  She continues to take medazepam for poor appetite.  Staff reported that her appetite has improved.   Past Medical History:  Diagnosis Date  .  Cerebrovascular disease   . Dementia (HCC)   . Depression   . DM type 2 (diabetes mellitus, type 2) (HCC) 07/02/2011  . Elevated LFTs   . Frequent falls   . HTN (hypertension) 07/02/2011  . Thrombocytopenia (HCC)   . UTI (lower urinary tract infection)    Past Surgical History:  Procedure Laterality Date  . ABDOMINAL HYSTERECTOMY    . CHOLECYSTECTOMY      No Known Allergies  Outpatient Encounter Medications as of 03/23/2020  Medication Sig  . acetaminophen (TYLENOL) 325 MG tablet Take 650 mg by mouth every 6 (six) hours as needed.  Marland Kitchen aspirin EC 81 MG tablet Take 81 mg by mouth 2 (two) times daily.   . bisacodyl (DULCOLAX) 10 MG suppository If not relieved by MOM, give 10 mg Bisacodyl suppositiory rectally X 1 dose in 24 hours as needed (Do not use constipation standing orders for residents with renal failure/CFR less than 30. Contact MD for orders) (Physician Order)  . levothyroxine (SYNTHROID) 50 MCG tablet Take 50 mcg by mouth daily before breakfast.  . lisinopril (ZESTRIL) 5 MG tablet Take 5 mg by mouth daily.  . magnesium hydroxide (MILK OF MAGNESIA) 400 MG/5ML suspension If no BM in 3 days, give 30 cc Milk of Magnesium p.o. x 1 dose in 24 hours as needed (Do not use standing constipation orders for residents with renal failure CFR less than 30. Contact MD for orders) (Physician Order)  . mirtazapine (REMERON) 15 MG tablet Take 15 mg by mouth at bedtime.  . Multiple Vitamin (MULTIVITAMIN WITH MINERALS)  TABS tablet Take 1 tablet by mouth 2 (two) times daily.  . NON FORMULARY Mech soft diet  . NON FORMULARY Take 1 each by mouth in the morning and at bedtime. Magic Cup  . Nutritional Supplement LIQD Take 120 mLs by mouth daily. MedPass  . sertraline (ZOLOFT) 50 MG tablet Take 50 mg by mouth daily.  . Sodium Phosphates (RA SALINE ENEMA RE) If not relieved by Biscodyl suppository, give disposable Saline Enema rectally X 1 dose/24 hrs as needed (Do not use constipation standing orders for  residents with renal failure/CFR less than 30. Contact MD for orders)(Physician Or  . traMADol (ULTRAM) 50 MG tablet Take by mouth every 6 (six) hours as needed for severe pain.   No facility-administered encounter medications on file as of 03/23/2020.    Review of Systems unable to obtain due to dementia.   Immunization History  Administered Date(s) Administered  . Influenza, High Dose Seasonal PF 06/29/2019  . Moderna SARS-COVID-2 Vaccination 10/10/2019, 11/07/2019  . Pneumococcal Polysaccharide-23 07/03/2011  . Tdap 10/22/2012   Pertinent  Health Maintenance Due  Topic Date Due  . FOOT EXAM  Never done  . OPHTHALMOLOGY EXAM  Never done  . PNA vac Low Risk Adult (2 of 2 - PCV13) 07/02/2012  . INFLUENZA VACCINE  04/22/2020  . HEMOGLOBIN A1C  05/08/2020  . DEXA SCAN  Discontinued   Fall Risk  04/20/2019  Falls in the past year? (No Data)  Comment Emmi Telephone Survey: data to providers prior to load  Number falls in past yr: (No Data)  Comment Emmi Telephone Survey Actual Response =      Vitals:   03/23/20 1523  BP: 128/72  Pulse: 88  Resp: 18  Temp: 98.5 F (36.9 C)  TempSrc: Oral  Weight: 140 lb (63.5 kg)  Height: 5\' 3"  (1.6 m)   Body mass index is 24.8 kg/m.  Physical Exam  GENERAL APPEARANCE: Well nourished. In no acute distress. Normal body habitus SKIN: See HPI MOUTH and THROAT: Lips are without lesions. Oral mucosa is moist and without lesions. Tongue is normal in shape, size, and color and without lesions RESPIRATORY: Breathing is even & unlabored, BS CTAB CARDIAC: RRR, no murmur,no extra heart sounds, no edema GI: Abdomen soft, normal BS, no masses, no tenderness NEUROLOGICAL: There is no tremor. Speech is clear.  Alert to self, disoriented to time and place. PSYCHIATRIC:  Affect and behavior are appropriate  Labs reviewed: Recent Labs    07/11/19 1347 07/12/19 0520 10/24/19 0000  NA 138 141 138  K 4.0 4.1 4.5  CL 105 113* 102  CO2 22 22 25*    GLUCOSE 232* 150*  --   BUN 22 23 17   CREATININE 1.13* 0.81 0.6  CALCIUM 9.0 8.1* 9.0   Recent Labs    07/11/19 1347 07/12/19 0520  AST 39 25  ALT 31 22  ALKPHOS 139* 95  BILITOT 2.2* 1.2  PROT 6.1* 4.9*  ALBUMIN 3.5 2.8*   Recent Labs    07/11/19 1347 07/11/19 1347 07/12/19 0520 07/12/19 0520 07/26/19 0000 10/24/19 0000 11/09/19 0000  WBC 9.6   < > 5.2  --  3.4 1.4 4.5  NEUTROABS 8.8*  --   --   --   --   --  3  HGB 12.7   < > 9.9*   < > 9.7* 12.1 11.0*  HCT 39.3   < > 30.8*   < > 27* 35* 32*  MCV 97.8  --  98.7  --   --   --   --   PLT 69*   < > 40*   < > 144* 68* 86*   < > = values in this interval not displayed.   Lab Results  Component Value Date   TSH 2.11 11/09/2019   Lab Results  Component Value Date   HGBA1C 5.9 11/09/2019    Assessment/Plan  1. Candidal skin infection -Start nystatin cream 100,000 units/gram apply topically under bilateral breasts Q shift  -Keep area clean and dry  2. Poor appetite Wt Readings from Last 3 Encounters:  03/23/20 140 lb (63.5 kg)  03/02/20 140 lb (63.5 kg)  02/08/20 131 lb 6.4 oz (59.6 kg)   -Appetite has improved, continue Mirtazapine  3. Essential hypertension - controlled, continue lisinopril    Family/ staff Communication: Discussed plan of care with resident and charge nurse.  Labs/tests ordered: None  Goals of care:  Long-term care  Kenard Gower, DNP, FNP-BC [facility] [phone] (Monday-Friday 8:00 a.m. - 5:00 p.m.) [phone] (after hours)

## 2020-03-30 ENCOUNTER — Non-Acute Institutional Stay (SKILLED_NURSING_FACILITY): Payer: Medicare Other | Admitting: Adult Health

## 2020-03-30 ENCOUNTER — Encounter: Payer: Self-pay | Admitting: Adult Health

## 2020-03-30 DIAGNOSIS — D696 Thrombocytopenia, unspecified: Secondary | ICD-10-CM | POA: Diagnosis not present

## 2020-03-30 DIAGNOSIS — R630 Anorexia: Secondary | ICD-10-CM

## 2020-03-30 DIAGNOSIS — S32810S Multiple fractures of pelvis with stable disruption of pelvic ring, sequela: Secondary | ICD-10-CM

## 2020-03-30 DIAGNOSIS — F0280 Dementia in other diseases classified elsewhere without behavioral disturbance: Secondary | ICD-10-CM

## 2020-03-30 DIAGNOSIS — I10 Essential (primary) hypertension: Secondary | ICD-10-CM | POA: Diagnosis not present

## 2020-03-30 DIAGNOSIS — E034 Atrophy of thyroid (acquired): Secondary | ICD-10-CM

## 2020-03-30 DIAGNOSIS — G309 Alzheimer's disease, unspecified: Secondary | ICD-10-CM

## 2020-03-30 DIAGNOSIS — F339 Major depressive disorder, recurrent, unspecified: Secondary | ICD-10-CM

## 2020-03-30 NOTE — Progress Notes (Signed)
Location:  [facility] Room Number: 102-B Place of Service:  SNF (31) Provider:  Kenard Gower, DNP, FNP-BC  Patient Care Team: Pecola Lawless, MD as PCP - General (Internal Medicine) Medina-Vargas, Margit Banda, NP as Nurse Practitioner (Internal Medicine)  Extended Emergency Contact Information Primary Emergency Contact: [name],[name] Address: [address]          [place], [place] [address] [place] of [place] Home Phone: [phone] Mobile Phone: [phone] Relation: Daughter  Code Status:  DNR  Goals of care: Advanced Directive information Advanced Directives 03/23/2020  Does Patient Have a Medical Advance Directive? Yes  Type of Advance Directive Out of facility DNR (pink MOST or yellow form)  Does patient want to make changes to medical advance directive? No - Patient declined  Copy of Healthcare Power of Attorney in Chart? -  Would patient like information on creating a medical advance directive? -  Pre-existing out of facility DNR order (yellow form or pink MOST form) Yellow form placed in chart (order not valid for inpatient use)     Chief Complaint  Patient presents with  . Medical Management of Chronic Issues    Routine [facility] visit    HPI:  Pt is an 86 y.o. female seen today for medical management of chronic diseases.  She is a long-term care resident of [facility].  She has a PMH of Alzheimer's disease, CVA, depression, hypertension, diabetes mellitus and hypothyroidism. She was seen in her room today. She appears comfortable. Denies pain.  She has not used her PRN tramadol for the past month.Labs showed that last 11/09/2019, her platelet was 86.  No bruising nor bleeding noted.  SBPs ranging from  101-128, with outlier 145.  She takes lisinopril for hypertension.   Past Medical History:  Diagnosis Date  . Cerebrovascular disease   . Dementia (HCC)   . Depression   . DM type 2 (diabetes  mellitus, type 2) (HCC) 07/02/2011  . Elevated LFTs   . Frequent falls   . HTN (hypertension) 07/02/2011  . Thrombocytopenia (HCC)   . UTI (lower urinary tract infection)    Past Surgical History:  Procedure Laterality Date  . ABDOMINAL HYSTERECTOMY    . CHOLECYSTECTOMY      No Known Allergies  Outpatient Encounter Medications as of 03/30/2020  Medication Sig  . acetaminophen (TYLENOL) 325 MG tablet Take 650 mg by mouth every 6 (six) hours as needed.  Marland Kitchen aspirin EC 81 MG tablet Take 81 mg by mouth 2 (two) times daily.   . bisacodyl (DULCOLAX) 10 MG suppository If not relieved by MOM, give 10 mg Bisacodyl suppositiory rectally X 1 dose in 24 hours as needed (Do not use constipation standing orders for residents with renal failure/CFR less than 30. Contact MD for orders) (Physician Order)  . levothyroxine (SYNTHROID) 50 MCG tablet Take 50 mcg by mouth daily before breakfast.  . lisinopril (ZESTRIL) 5 MG tablet Take 5 mg by mouth daily.  . magnesium hydroxide (MILK OF MAGNESIA) 400 MG/5ML suspension If no BM in 3 days, give 30 cc Milk of Magnesium p.o. x 1 dose in 24 hours as needed (Do not use standing constipation orders for residents with renal failure CFR less than 30. Contact MD for orders) (Physician Order)  . mirtazapine (REMERON) 15 MG tablet Take 15 mg by mouth at bedtime.  . Multiple Vitamin (MULTIVITAMIN WITH MINERALS) TABS tablet Take 1 tablet by mouth 2 (two) times daily.  . NON FORMULARY Mech  soft diet  . NON FORMULARY Take 1 each by mouth in the morning and at bedtime. Magic Cup  . Nutritional Supplement LIQD Take 120 mLs by mouth daily. MedPass  . nystatin cream (MYCOSTATIN) Apply 1 application topically 3 (three) times daily. Apply underneath bilateral breasts for rash  . sertraline (ZOLOFT) 50 MG tablet Take 50 mg by mouth daily.  . Sodium Phosphates (RA SALINE ENEMA RE) If not relieved by Biscodyl suppository, give disposable Saline Enema rectally X 1 dose/24 hrs as needed  (Do not use constipation standing orders for residents with renal failure/CFR less than 30. Contact MD for orders)(Physician Or  . traMADol (ULTRAM) 50 MG tablet Take by mouth every 6 (six) hours as needed for severe pain.   No facility-administered encounter medications on file as of 03/30/2020.    Review of Systems unable to obtain due to dementia    Immunization History  Administered Date(s) Administered  . Influenza, High Dose Seasonal PF 06/29/2019  . Moderna SARS-COVID-2 Vaccination 10/10/2019, 11/07/2019  . Pneumococcal Polysaccharide-23 07/03/2011  . Tdap 10/22/2012   Pertinent  Health Maintenance Due  Topic Date Due  . FOOT EXAM  Never done  . OPHTHALMOLOGY EXAM  Never done  . PNA vac Low Risk Adult (2 of 2 - PCV13) 07/02/2012  . INFLUENZA VACCINE  04/22/2020  . HEMOGLOBIN A1C  05/08/2020  . DEXA SCAN  Discontinued   Fall Risk  04/20/2019  Falls in the past year? (No Data)  Comment Emmi Telephone Survey: data to providers prior to load  Number falls in past yr: (No Data)  Comment Emmi Telephone Survey Actual Response =      Vitals:   03/30/20 0842  BP: 122/76  Pulse: 95  Resp: 20  Temp: (!) 97.5 F (36.4 C)  TempSrc: Oral  Weight: 140 lb (63.5 kg)  Height: 5\' 3"  (1.6 m)   Body mass index is 24.8 kg/m.  Physical Exam  GENERAL APPEARANCE: Well nourished. In no acute distress. Normal body habitus SKIN:  Skin is warm and dry.  MOUTH and THROAT: Lips are without lesions. Oral mucosa is moist and without lesions. Tongue is normal in shape, size, and color and without lesions RESPIRATORY: Breathing is even & unlabored, BS CTAB CARDIAC: RRR, no murmur,no extra heart sounds, no edema GI: Abdomen soft, normal BS, no masses, no tenderness NEUROLOGICAL: There is no tremor. Speech is clear. Alert to self, disoriented to time and place. PSYCHIATRIC:  Affect and behavior are appropriate  Labs reviewed: Recent Labs    07/11/19 1347 07/12/19 0520 10/24/19 0000    NA 138 141 138  K 4.0 4.1 4.5  CL 105 113* 102  CO2 22 22 25*  GLUCOSE 232* 150*  --   BUN 22 23 17   CREATININE 1.13* 0.81 0.6  CALCIUM 9.0 8.1* 9.0   Recent Labs    07/11/19 1347 07/12/19 0520  AST 39 25  ALT 31 22  ALKPHOS 139* 95  BILITOT 2.2* 1.2  PROT 6.1* 4.9*  ALBUMIN 3.5 2.8*   Recent Labs    07/11/19 1347 07/11/19 1347 07/12/19 0520 07/12/19 0520 07/26/19 0000 10/24/19 0000 11/09/19 0000  WBC 9.6   < > 5.2  --  3.4 1.4 4.5  NEUTROABS 8.8*  --   --   --   --   --  3  HGB 12.7   < > 9.9*   < > 9.7* 12.1 11.0*  HCT 39.3   < > 30.8*   < >  27* 35* 32*  MCV 97.8  --  98.7  --   --   --   --   PLT 69*   < > 40*   < > 144* 68* 86*   < > = values in this interval not displayed.   Lab Results  Component Value Date   TSH 2.11 11/09/2019   Lab Results  Component Value Date   HGBA1C 5.9 11/09/2019    Assessment/Plan  1. Hypothyroidism due to acquired atrophy of thyroid Lab Results  Component Value Date   TSH 2.11 11/09/2019   -   Continue levothyroxine  2. Poor appetite Wt Readings from Last 3 Encounters:  03/30/20 140 lb (63.5 kg)  03/23/20 140 lb (63.5 kg)  03/02/20 140 lb (63.5 kg)   -    Weight is a stable, continue mirtazapine and supplementation with Magic cup and med Pass  3. Essential hypertension -   Stable, continue lisinopril  4. Thrombocytopenia (HCC) Lab Results  Component Value Date   PLT 86 (A) 11/09/2019   -No bruising nor bleeding, will re-check platelet  5. Major depression, recurrent, chronic (HCC) -Mood is stable, continue sertraline  6. Alzheimer's dementia without behavioral disturbance, unspecified timing of dementia onset (HCC) -   Continue supportive care, fall precautions  7.  Multiple closed fractures of pelvis with stable disruption of pelvic ring, sequela -  Denies pain, discontinue tramadol due to nonuse     Family/ staff Communication: Discussed plan of care with resident and charge nurse.  Labs/tests  ordered:   CBC and BMP  Goals of care:   Long-term care  Kenard Gower, DNP, FNP-BC [facility] and Adult Medicine [phone] (Monday-Friday 8:00 a.m. - 5:00 p.m.) [phone] (after hours)

## 2020-04-03 LAB — CBC AND DIFFERENTIAL
HCT: 34 — AB (ref 36–46)
HCT: 34 — AB (ref 36–46)
Hemoglobin: 11.3 — AB (ref 12.0–16.0)
Hemoglobin: 11.3 — AB (ref 12.0–16.0)
Neutrophils Absolute: 2
Neutrophils Absolute: 2
Platelets: 110 — AB (ref 150–399)
Platelets: 110 — AB (ref 150–399)
WBC: 4.1
WBC: 4.1

## 2020-04-03 LAB — BASIC METABOLIC PANEL
BUN: 15 (ref 4–21)
BUN: 15 (ref 4–21)
CO2: 25 — AB (ref 13–22)
CO2: 25 — AB (ref 13–22)
Chloride: 105 (ref 99–108)
Chloride: 105 (ref 99–108)
Creatinine: 0.5 (ref 0.5–1.1)
Creatinine: 0.5 (ref 0.5–1.1)
Glucose: 104
Glucose: 104
Potassium: 4.3 (ref 3.4–5.3)
Potassium: 4.3 (ref 3.4–5.3)
Sodium: 141 (ref 137–147)
Sodium: 141 (ref 137–147)

## 2020-04-03 LAB — CBC
RBC: 3.51 — AB (ref 3.87–5.11)
RBC: 3.51 — AB (ref 3.87–5.11)

## 2020-04-03 LAB — COMPREHENSIVE METABOLIC PANEL
Calcium: 9.1 (ref 8.7–10.7)
Calcium: 9.1 (ref 8.7–10.7)
GFR calc Af Amer: 90
GFR calc Af Amer: 90
GFR calc non Af Amer: 89.57
GFR calc non Af Amer: 89.57

## 2020-04-26 ENCOUNTER — Encounter: Payer: Self-pay | Admitting: Adult Health

## 2020-04-26 ENCOUNTER — Non-Acute Institutional Stay (SKILLED_NURSING_FACILITY): Payer: Medicare Other | Admitting: Adult Health

## 2020-04-26 DIAGNOSIS — E034 Atrophy of thyroid (acquired): Secondary | ICD-10-CM

## 2020-04-26 DIAGNOSIS — R630 Anorexia: Secondary | ICD-10-CM

## 2020-04-26 DIAGNOSIS — I10 Essential (primary) hypertension: Secondary | ICD-10-CM | POA: Diagnosis not present

## 2020-04-26 DIAGNOSIS — G309 Alzheimer's disease, unspecified: Secondary | ICD-10-CM

## 2020-04-26 DIAGNOSIS — F339 Major depressive disorder, recurrent, unspecified: Secondary | ICD-10-CM

## 2020-04-26 DIAGNOSIS — F0280 Dementia in other diseases classified elsewhere without behavioral disturbance: Secondary | ICD-10-CM

## 2020-04-26 NOTE — Progress Notes (Signed)
Location:  [facility] Room Number: 102-B Place of Service:  SNF (31) Provider:  Kenard Gower, DNP, FNP-BC  Patient Care Team: Pecola Lawless, MD as PCP - General (Internal Medicine) Medina-Vargas, Margit Banda, NP as Nurse Practitioner (Internal Medicine)  Extended Emergency Contact Information Primary Emergency Contact: [name],[name] Address: [address]          [place], [place] [address] [place] of [place] Home Phone: [phone] Mobile Phone: [phone] Relation: Daughter  Code Status:  DNR  Goals of care: Advanced Directive information Advanced Directives 04/26/2020  Does Patient Have a Medical Advance Directive? Yes  Type of Advance Directive Out of facility DNR (pink MOST or yellow form)  Does patient want to make changes to medical advance directive? No - Patient declined  Copy of Healthcare Power of Attorney in Chart? -  Would patient like information on creating a medical advance directive? -  Pre-existing out of facility DNR order (yellow form or pink MOST form) Yellow form placed in chart (order not valid for inpatient use)     Chief Complaint  Patient presents with  . Medical Management of Chronic Issues    Routine [facility] visit    HPI:  Pt is an 86 y.o. female seen today for medical management of chronic diseases.  He is a long-term care resident of [facility].  She has a PMH of Alzheimer's disease, CVA, depression, hypertension, diabetes mellitus and hypothyroidism. She was seen in the room today. SBPs ranges from 108 to 131. She takes lisinopril for hypertension. Latest tsh 2.11, 01/07/20. She takes levothyroxine for hypothyroidism.   Past Medical History:  Diagnosis Date  . Cerebrovascular disease   . Dementia (HCC)   . Depression   . DM type 2 (diabetes mellitus, type 2) (HCC) 07/02/2011  . Elevated LFTs   . Frequent falls   . HTN (hypertension) 07/02/2011  . Thrombocytopenia (HCC)     . UTI (lower urinary tract infection)    Past Surgical History:  Procedure Laterality Date  . ABDOMINAL HYSTERECTOMY    . CHOLECYSTECTOMY      No Known Allergies  Outpatient Encounter Medications as of 04/26/2020  Medication Sig  . acetaminophen (TYLENOL) 325 MG tablet Take 650 mg by mouth every 6 (six) hours as needed.  Marland Kitchen aspirin EC 81 MG tablet Take 81 mg by mouth 2 (two) times daily.   . bisacodyl (DULCOLAX) 10 MG suppository If not relieved by MOM, give 10 mg Bisacodyl suppositiory rectally X 1 dose in 24 hours as needed (Do not use constipation standing orders for residents with renal failure/CFR less than 30. Contact MD for orders) (Physician Order)  . levothyroxine (SYNTHROID) 50 MCG tablet Take 50 mcg by mouth daily before breakfast.  . lisinopril (ZESTRIL) 5 MG tablet Take 5 mg by mouth daily.  . magnesium hydroxide (MILK OF MAGNESIA) 400 MG/5ML suspension If no BM in 3 days, give 30 cc Milk of Magnesium p.o. x 1 dose in 24 hours as needed (Do not use standing constipation orders for residents with renal failure CFR less than 30. Contact MD for orders) (Physician Order)  . mirtazapine (REMERON) 15 MG tablet Take 15 mg by mouth at bedtime.  . Multiple Vitamin (MULTIVITAMIN WITH MINERALS) TABS tablet Take 1 tablet by mouth 2 (two) times daily.  . NON FORMULARY Mech soft diet  . NON FORMULARY Take 1 each by mouth in the morning and at bedtime. Magic Cup  . Nutritional Supplement LIQD Take  120 mLs by mouth daily. MedPass  . nystatin cream (MYCOSTATIN) Apply 1 application topically 3 (three) times daily. Apply underneath bilateral breasts for rash  . sertraline (ZOLOFT) 50 MG tablet Take 50 mg by mouth daily.  . Sodium Phosphates (RA SALINE ENEMA RE) If not relieved by Biscodyl suppository, give disposable Saline Enema rectally X 1 dose/24 hrs as needed (Do not use constipation standing orders for residents with renal failure/CFR less than 30. Contact MD for orders)(Physician Or  .  [DISCONTINUED] traMADol (ULTRAM) 50 MG tablet Take by mouth every 6 (six) hours as needed for severe pain.   No facility-administered encounter medications on file as of 04/26/2020.    Review of Systems unable to obtain due to dementia   Immunization History  Administered Date(s) Administered  . Influenza, High Dose Seasonal PF 06/29/2019  . Moderna SARS-COVID-2 Vaccination 10/10/2019, 11/07/2019  . Pneumococcal Polysaccharide-23 07/03/2011  . Tdap 10/22/2012   Pertinent  Health Maintenance Due  Topic Date Due  . FOOT EXAM  Never done  . OPHTHALMOLOGY EXAM  Never done  . PNA vac Low Risk Adult (2 of 2 - PCV13) 07/02/2012  . INFLUENZA VACCINE  04/22/2020  . HEMOGLOBIN A1C  05/08/2020  . DEXA SCAN  Discontinued   Fall Risk  04/20/2019  Falls in the past year? (No Data)  Comment Emmi Telephone Survey: data to providers prior to load  Number falls in past yr: (No Data)  Comment Emmi Telephone Survey Actual Response =      Vitals:   04/26/20 1208  BP: (!) 117/57  Pulse: 70  Resp: 18  Temp: (!) 97.3 F (36.3 C)  TempSrc: Oral  Weight: 140 lb 6.4 oz (63.7 kg)  Height: 5\' 3"  (1.6 m)   Body mass index is 24.87 kg/m.  Physical Exam  GENERAL APPEARANCE: Well nourished. In no acute distress. Normal body habitus SKIN:  Skin is warm and dry.  MOUTH and THROAT: Lips are without lesions. Oral mucosa is moist and without lesions.  RESPIRATORY: Breathing is even & unlabored, BS CTAB CARDIAC: RRR, no murmur,no extra heart sounds, no edema GI: Abdomen soft, normal BS, no masses, no tenderness EXTREMITIES:  Able to move X 4 extremities NEUROLOGICAL: There is no tremor. Speech is clear. Alert to self, disoriented to time and place. PSYCHIATRIC:  Affect and behavior are appropriate  Labs reviewed: Recent Labs    07/11/19 1347 07/12/19 0520 10/24/19 0000  NA 138 141 138  K 4.0 4.1 4.5  CL 105 113* 102  CO2 22 22 25*  GLUCOSE 232* 150*  --   BUN 22 23 17   CREATININE 1.13*  0.81 0.6  CALCIUM 9.0 8.1* 9.0   Recent Labs    07/11/19 1347 07/12/19 0520  AST 39 25  ALT 31 22  ALKPHOS 139* 95  BILITOT 2.2* 1.2  PROT 6.1* 4.9*  ALBUMIN 3.5 2.8*   Recent Labs    07/11/19 1347 07/11/19 1347 07/12/19 0520 07/12/19 0520 07/26/19 0000 10/24/19 0000 11/09/19 0000  WBC 9.6   < > 5.2  --  3.4 1.4 4.5  NEUTROABS 8.8*  --   --   --   --   --  3  HGB 12.7   < > 9.9*   < > 9.7* 12.1 11.0*  HCT 39.3   < > 30.8*   < > 27* 35* 32*  MCV 97.8  --  98.7  --   --   --   --   PLT 69*   < >  40*   < > 144* 68* 86*   < > = values in this interval not displayed.   Lab Results  Component Value Date   TSH 2.11 11/09/2019   Lab Results  Component Value Date   HGBA1C 5.9 11/09/2019    Assessment/Plan  1. Essential hypertension -  Controlled, continue lisinopril  2. Poor appetite -  Wt Readings from Last 3 Encounters:  04/26/20 140 lb 6.4 oz (63.7 kg)  03/30/20 140 lb (63.5 kg)  03/23/20 140 lb (63.5 kg)   -Weight is stable, appetite has improved -Continue mirtazapine   3. Hypothyroidism due to acquired atrophy of thyroid Lab Results  Component Value Date   TSH 2.11 11/09/2019   -Continue levothyroxine  4. Major depression, recurrent, chronic (HCC) -Mood is stable, continue sertraline -Followed up by psych NP  5. Alzheimer's dementia without behavioral disturbance, unspecified timing of dementia onset (HCC) -Continue supportive care and fall precautions    Family/ staff Communication:   Discussed plan of care with resident and charge nurse.  Labs/tests ordered: None  Goals of care:   Long-term care   Kenard Gower, DNP, MSN, FNP-BC [facility] [phone] (Monday-Friday 8:00 a.m. - 5:00 p.m.) [phone] (after hours)

## 2020-05-09 ENCOUNTER — Non-Acute Institutional Stay (SKILLED_NURSING_FACILITY): Payer: Medicare Other | Admitting: Adult Health

## 2020-05-09 ENCOUNTER — Encounter: Payer: Self-pay | Admitting: Adult Health

## 2020-05-09 DIAGNOSIS — E1151 Type 2 diabetes mellitus with diabetic peripheral angiopathy without gangrene: Secondary | ICD-10-CM | POA: Diagnosis not present

## 2020-05-09 DIAGNOSIS — B372 Candidiasis of skin and nail: Secondary | ICD-10-CM

## 2020-05-09 NOTE — Progress Notes (Signed)
Location:  [facility] Room Number: 125-B Place of Service:  SNF (31) Provider:  Kenard Gower, DNP, FNP-BC  Patient Care Team: Pecola Lawless, MD as PCP - General (Internal Medicine) Medina-Vargas, Margit Banda, NP as Nurse Practitioner (Internal Medicine)  Extended Emergency Contact Information Primary Emergency Contact: [name],[name] Address: [address]          [place], [place] [address] D[place] of [place] Home Phone: [phone] Mobile Phone: [phone] Relation: Daughter  Code Status:  DNR  Goals of care: Advanced Directive information Advanced Directives 04/26/2020  Does Patient Have a Medical Advance Directive? Yes  Type of Advance Directive Out of facility DNR (pink MOST or yellow form)  Does patient want to make changes to medical advance directive? No - Patient declined  Copy of Healthcare Power of Attorney in Chart? -  Would patient like information on creating a medical advance directive? -  Pre-existing out of facility DNR order (yellow form or pink MOST form) Yellow form placed in chart (order not valid for inpatient use)     Chief Complaint  Patient presents with  . Acute Visit    Patient is seen for rash on abdominal folds    HPI:  Pt is an 86 y.o. female seen today for rashes on abdominal folds. She is a long-term care resident of [facility]. She has a PMH of Alzheimer's disease, CVA, hypertension, diabetes mellitus and hypothyroidism. She was seen in her room today. She was noted to have erythematous rashes on bilateral abdominal folds. She has diabetes mellitus with latest hgbA1c 5.9, 11/09/19. She is not on any diabetic medication.   Past Medical History:  Diagnosis Date  . Cerebrovascular disease   . Dementia (HCC)   . Depression   . DM type 2 (diabetes mellitus, type 2) (HCC) 07/02/2011  . Elevated LFTs   . Frequent falls   . HTN (hypertension) 07/02/2011  . Thrombocytopenia (HCC)   .  UTI (lower urinary tract infection)    Past Surgical History:  Procedure Laterality Date  . ABDOMINAL HYSTERECTOMY    . CHOLECYSTECTOMY      No Known Allergies  Outpatient Encounter Medications as of 05/09/2020  Medication Sig  . acetaminophen (TYLENOL) 325 MG tablet Take 650 mg by mouth every 6 (six) hours as needed.  Marland Kitchen aspirin EC 81 MG tablet Take 81 mg by mouth 2 (two) times daily.   . bisacodyl (DULCOLAX) 10 MG suppository If not relieved by MOM, give 10 mg Bisacodyl suppositiory rectally X 1 dose in 24 hours as needed (Do not use constipation standing orders for residents with renal failure/CFR less than 30. Contact MD for orders) (Physician Order)  . levothyroxine (SYNTHROID) 50 MCG tablet Take 50 mcg by mouth daily before breakfast.  . lisinopril (ZESTRIL) 5 MG tablet Take 5 mg by mouth daily.  . magnesium hydroxide (MILK OF MAGNESIA) 400 MG/5ML suspension If no BM in 3 days, give 30 cc Milk of Magnesium p.o. x 1 dose in 24 hours as needed (Do not use standing constipation orders for residents with renal failure CFR less than 30. Contact MD for orders) (Physician Order)  . mirtazapine (REMERON) 15 MG tablet Take 15 mg by mouth at bedtime.  . Multiple Vitamin (MULTIVITAMIN WITH MINERALS) TABS tablet Take 1 tablet by mouth 2 (two) times daily.  . NON FORMULARY Mech soft diet  . NON FORMULARY Take 1 each by mouth in the morning and at bedtime. Magic Cup  . Nutritional  Supplement LIQD Take 120 mLs by mouth daily. MedPass  . nystatin (NYSTATIN) powder Apply 1 application topically in the morning, at noon, in the evening, and at bedtime.  Marland Kitchen nystatin cream (MYCOSTATIN) Apply 1 application topically 3 (three) times daily. Apply underneath bilateral breasts for rash  . sertraline (ZOLOFT) 50 MG tablet Take 50 mg by mouth daily.  . Sodium Phosphates (RA SALINE ENEMA RE) If not relieved by Biscodyl suppository, give disposable Saline Enema rectally X 1 dose/24 hrs as needed (Do not use  constipation standing orders for residents with renal failure/CFR less than 30. Contact MD for orders)(Physician Or   No facility-administered encounter medications on file as of 05/09/2020.    Review of Systems  GENERAL: No change in appetite, no fatigue, no weight changes, no fever, chills or weakness MOUTH and THROAT: Denies oral discomfort, gingival pain or bleeding, pain from teeth or hoarseness   RESPIRATORY: no cough, SOB, DOE, wheezing, hemoptysis CARDIAC: No chest pain, edema or palpitations GI: No abdominal pain, diarrhea, constipation, heart burn, nausea or vomiting GU: Denies dysuria, frequency, hematuria or discharge NEUROLOGICAL: Denies dizziness, syncope, numbness, or headache PSYCHIATRIC: Denies feelings of depression or anxiety. No report of hallucinations, insomnia, paranoia, or agitation   Immunization History  Administered Date(s) Administered  . Influenza, High Dose Seasonal PF 06/29/2019  . Moderna SARS-COVID-2 Vaccination 10/10/2019, 11/07/2019  . Pneumococcal Polysaccharide-23 07/03/2011  . Tdap 10/22/2012   Pertinent  Health Maintenance Due  Topic Date Due  . FOOT EXAM  Never done  . OPHTHALMOLOGY EXAM  Never done  . PNA vac Low Risk Adult (2 of 2 - PCV13) 07/02/2012  . INFLUENZA VACCINE  04/22/2020  . HEMOGLOBIN A1C  05/08/2020  . DEXA SCAN  Discontinued   Fall Risk  04/20/2019  Falls in the past year? (No Data)  Comment Emmi Telephone Survey: data to providers prior to load  Number falls in past yr: (No Data)  Comment Emmi Telephone Survey Actual Response =      Vitals:   05/09/20 1539  BP: 140/70  Pulse: 70  Resp: 18  Temp: 98 F (36.7 C)  TempSrc: Oral  Weight: 140 lb 6.4 oz (63.7 kg)  Height: 5\' 3"  (1.6 m)   Body mass index is 24.87 kg/m.  Physical Exam  GENERAL APPEARANCE: Well nourished. In no acute distress. Normal body habitus SKIN:  Erythematous rashes on bilateral abdominal folds, moist MOUTH and THROAT: Lips are without  lesions. Oral mucosa is moist and without lesions.  RESPIRATORY: Breathing is even & unlabored, BS CTAB CARDIAC: RRR, no murmur,no extra heart sounds, no edema GI: Abdomen soft, normal BS, no masses, no tenderness NEUROLOGICAL: There is no tremor. Speech is clear. Alert to self, disoriented to time and place. PSYCHIATRIC:  Affect and behavior are appropriate  Labs reviewed: Recent Labs    07/11/19 1347 07/11/19 1347 07/12/19 0520 10/24/19 0000 04/03/20 0000  NA 138   < > 141 138 141  K 4.0   < > 4.1 4.5 4.3  CL 105   < > 113* 102 105  CO2 22   < > 22 25* 25*  GLUCOSE 232*  --  150*  --   --   BUN 22   < > 23 17 15   CREATININE 1.13*   < > 0.81 0.6 0.5  CALCIUM 9.0   < > 8.1* 9.0 9.1   < > = values in this interval not displayed.   Recent Labs    07/11/19 1347 07/12/19  0520  AST 39 25  ALT 31 22  ALKPHOS 139* 95  BILITOT 2.2* 1.2  PROT 6.1* 4.9*  ALBUMIN 3.5 2.8*   Recent Labs    07/11/19 1347 07/11/19 1347 07/12/19 0520 07/26/19 0000 10/24/19 0000 11/09/19 0000 04/03/20 0000  WBC 9.6   < > 5.2   < > 1.4 4.5 4.1  NEUTROABS 8.8*  --   --   --   --  3 2  HGB 12.7   < > 9.9*   < > 12.1 11.0* 11.3*  HCT 39.3   < > 30.8*   < > 35* 32* 34*  MCV 97.8  --  98.7  --   --   --   --   PLT 69*   < > 40*   < > 68* 86* 110*   < > = values in this interval not displayed.   Lab Results  Component Value Date   TSH 2.11 11/09/2019   Lab Results  Component Value Date   HGBA1C 5.9 11/09/2019   Assessment/Plan  1. Candidal skin infection -  Will start on nystatin 100,000 unit/gram powder apply to bilateral abdominal folds rashes QID -Keep skin clean and dry -Monitor for skin infection  2. Diabetes mellitus with peripheral vascular disease (HCC) Lab Results  Component Value Date   HGBA1C 5.9 11/09/2019   -  Diet-controlled   Family/ staff Communication:  Discussed plan of care with resident and charge nurse.  Labs/tests ordered:  None  Goals of care:  Long-term  care   Kenard Gower, DNP, MSN, FNP-BC [facility] [phone] (Monday-Friday 8:00 a.m. - 5:00 p.m.) [phone] (after hours)

## 2020-05-24 ENCOUNTER — Encounter: Payer: Self-pay | Admitting: Adult Health

## 2020-05-24 ENCOUNTER — Non-Acute Institutional Stay (SKILLED_NURSING_FACILITY): Payer: Medicare Other | Admitting: Adult Health

## 2020-05-24 DIAGNOSIS — I10 Essential (primary) hypertension: Secondary | ICD-10-CM | POA: Diagnosis not present

## 2020-05-24 DIAGNOSIS — F339 Major depressive disorder, recurrent, unspecified: Secondary | ICD-10-CM | POA: Diagnosis not present

## 2020-05-24 DIAGNOSIS — E1151 Type 2 diabetes mellitus with diabetic peripheral angiopathy without gangrene: Secondary | ICD-10-CM

## 2020-05-24 DIAGNOSIS — F0280 Dementia in other diseases classified elsewhere without behavioral disturbance: Secondary | ICD-10-CM

## 2020-05-24 DIAGNOSIS — Z9189 Other specified personal risk factors, not elsewhere classified: Secondary | ICD-10-CM

## 2020-05-24 DIAGNOSIS — T148XXA Other injury of unspecified body region, initial encounter: Secondary | ICD-10-CM

## 2020-05-24 DIAGNOSIS — E034 Atrophy of thyroid (acquired): Secondary | ICD-10-CM

## 2020-05-24 DIAGNOSIS — G309 Alzheimer's disease, unspecified: Secondary | ICD-10-CM

## 2020-05-24 DIAGNOSIS — R630 Anorexia: Secondary | ICD-10-CM

## 2020-05-24 NOTE — Progress Notes (Signed)
Location:  [facility] Room Number: 102-B Place of Service:  SNF (31) Provider:  Kenard Gower, DNP, FNP-BC  Patient Care Team: Pecola Lawless, MD as PCP - General (Internal Medicine) Medina-Vargas, Margit Banda, NP as Nurse Practitioner (Internal Medicine)  Extended Emergency Contact Information Primary Emergency Contact: [name],[name] Address: [address]          [place], [place] [address] Darden [place] of [place] Home Phone: [phone] Mobile Phone: [phone] Relation: Daughter  Code Status:  DNR  Goals of care: Advanced Directive information Advanced Directives 05/24/2020  Does Patient Have a Medical Advance Directive? Yes  Type of Advance Directive Out of facility DNR (pink MOST or yellow form)  Does patient want to make changes to medical advance directive? No - Patient declined  Copy of Healthcare Power of Attorney in Chart? -  Would patient like information on creating a medical advance directive? -  Pre-existing out of facility DNR order (yellow form or pink MOST form) Yellow form placed in chart (order not valid for inpatient use)     Chief Complaint  Patient presents with  . Medical Management of Chronic Issues    Routine [facility] visit  . Quality Metric Gaps    Patient needs diabetic foot and eye exams. No longer on medications for diabetes  . Immunizations    Needs Prenar-13 immunization    HPI:  Pt is an 86 y.o. female seen today for medical management of chronic diseases.  She is a long-term care resident of [facility].  She has a PMH of Alzheimer's disease, CVA, depression, hypertension, diabetes mellitus and hypothyroidism.  She was seen in her room today. Left pinky with bruising. No reported trauma. X-ray showed no acute abnormality and has mild osteoarthritis. Last platelet 110, hgb 11.3,  04/03/20. SBPs ranging from 101 to 128. She takes  Lisinopril 5 mg daily. She is currently on droplet  precautions for COVID-19.   Past Medical History:  Diagnosis Date  . Cerebrovascular disease   . Dementia (HCC)   . Depression   . DM type 2 (diabetes mellitus, type 2) (HCC) 07/02/2011  . Elevated LFTs   . Frequent falls   . HTN (hypertension) 07/02/2011  . Thrombocytopenia (HCC)   . UTI (lower urinary tract infection)    Past Surgical History:  Procedure Laterality Date  . ABDOMINAL HYSTERECTOMY    . CHOLECYSTECTOMY      No Known Allergies  Outpatient Encounter Medications as of 05/24/2020  Medication Sig  . acetaminophen (TYLENOL) 325 MG tablet Take 650 mg by mouth every 6 (six) hours as needed.  Marland Kitchen aspirin EC 81 MG tablet Take 81 mg by mouth 2 (two) times daily.   . bisacodyl (DULCOLAX) 10 MG suppository If not relieved by MOM, give 10 mg Bisacodyl suppositiory rectally X 1 dose in 24 hours as needed (Do not use constipation standing orders for residents with renal failure/CFR less than 30. Contact MD for orders) (Physician Order)  . levothyroxine (SYNTHROID) 50 MCG tablet Take 50 mcg by mouth daily before breakfast.  . lisinopril (ZESTRIL) 5 MG tablet Take 5 mg by mouth daily.  . magnesium hydroxide (MILK OF MAGNESIA) 400 MG/5ML suspension If no BM in 3 days, give 30 cc Milk of Magnesium p.o. x 1 dose in 24 hours as needed (Do not use standing constipation orders for residents with renal failure CFR less than 30. Contact MD for orders) (Physician Order)  . mirtazapine (REMERON) 15 MG tablet Take  15 mg by mouth at bedtime.  . Multiple Vitamin (MULTIVITAMIN WITH MINERALS) TABS tablet Take 1 tablet by mouth 2 (two) times daily.  . NON FORMULARY Mech soft diet  . NON FORMULARY Take 1 each by mouth in the morning and at bedtime. Magic Cup  . Nutritional Supplement LIQD Take 120 mLs by mouth daily. MedPass  . nystatin (NYSTATIN) powder Apply 1 application topically in the morning, at noon, in the evening, and at bedtime.  Marland Kitchen nystatin cream (MYCOSTATIN) Apply 1 application topically 3  (three) times daily. Apply underneath bilateral breasts for rash  . sertraline (ZOLOFT) 50 MG tablet Take 50 mg by mouth daily.  . Sodium Phosphates (RA SALINE ENEMA RE) If not relieved by Biscodyl suppository, give disposable Saline Enema rectally X 1 dose/24 hrs as needed (Do not use constipation standing orders for residents with renal failure/CFR less than 30. Contact MD for orders)(Physician Or   No facility-administered encounter medications on file as of 05/24/2020.    Review of Systems  GENERAL: No change in appetite, no fatigue, no weight changes, no fever, chills or weakness MOUTH and THROAT: Denies oral discomfort, gingival pain or bleeding RESPIRATORY: no cough, SOB, DOE, wheezing, hemoptysis CARDIAC: No chest pain, edema or palpitations GI: No abdominal pain, diarrhea, constipation, heart burn, nausea or vomiting GU: Denies dysuria, frequency, hematuria or discharge NEUROLOGICAL: Denies dizziness, syncope, numbness, or headache PSYCHIATRIC: Denies feelings of depression or anxiety. No report of hallucinations, insomnia, paranoia, or agitation   Immunization History  Administered Date(s) Administered  . Influenza, High Dose Seasonal PF 06/29/2019  . Moderna SARS-COVID-2 Vaccination 10/10/2019, 11/07/2019  . Pneumococcal Polysaccharide-23 07/03/2011  . Tdap 10/22/2012   Pertinent  Health Maintenance Due  Topic Date Due  . FOOT EXAM  Never done  . OPHTHALMOLOGY EXAM  Never done  . PNA vac Low Risk Adult (2 of 2 - PCV13) 07/02/2012  . INFLUENZA VACCINE  04/22/2020  . HEMOGLOBIN A1C  05/08/2020  . DEXA SCAN  Discontinued   Fall Risk  04/20/2019  Falls in the past year? (No Data)  Comment Emmi Telephone Survey: data to providers prior to load  Number falls in past yr: (No Data)  Comment Emmi Telephone Survey Actual Response =      Vitals:   05/24/20 1232  BP: (!) 101/55  Pulse: 67  Resp: 16  Temp: (!) 97.3 F (36.3 C)  TempSrc: Oral  Weight: 140 lb 6.4 oz (63.7  kg)  Height: 5\' 3"  (1.6 m)   Body mass index is 24.87 kg/m.  Physical Exam  GENERAL APPEARANCE: Well nourished. In no acute distress. Normal body habitus SKIN:  Left pinky with dark bruising MOUTH and THROAT: Lips are without lesions. Oral mucosa is moist and without lesions. Tongue is normal in shape, size, and color and without lesions RESPIRATORY: Breathing is even & unlabored, BS CTAB CARDIAC: RRR, no murmur,no extra heart sounds, no edema GI: Abdomen soft, normal BS, no masses, no tenderness NEUROLOGICAL: There is no tremor. Speech is clear. Al;ert to self, disoriented to time and place. PSYCHIATRIC:  Affect and behavior are appropriate  Labs reviewed: Recent Labs    07/11/19 1347 07/11/19 1347 07/12/19 0520 10/24/19 0000 04/03/20 0000  NA 138   < > 141 138 141  141  K 4.0   < > 4.1 4.5 4.3  4.3  CL 105   < > 113* 102 105  105  CO2 22   < > 22 25* 25*  25*  GLUCOSE  232*  --  150*  --   --   BUN 22   < > 23 17 15  15   CREATININE 1.13*   < > 0.81 0.6 0.5  0.5  CALCIUM 9.0   < > 8.1* 9.0 9.1  9.1   < > = values in this interval not displayed.   Recent Labs    07/11/19 1347 07/12/19 0520  AST 39 25  ALT 31 22  ALKPHOS 139* 95  BILITOT 2.2* 1.2  PROT 6.1* 4.9*  ALBUMIN 3.5 2.8*   Recent Labs    07/11/19 1347 07/11/19 1347 07/12/19 0520 07/26/19 0000 10/24/19 0000 11/09/19 0000 04/03/20 0000  WBC 9.6   < > 5.2   < > 1.4 4.5 4.1  4.1  NEUTROABS 8.8*  --   --   --   --  3 2  2   HGB 12.7   < > 9.9*   < > 12.1 11.0* 11.3*  11.3*  HCT 39.3   < > 30.8*   < > 35* 32* 34*  34*  MCV 97.8  --  98.7  --   --   --   --   PLT 69*   < > 40*   < > 68* 86* 110*  110*   < > = values in this interval not displayed.   Lab Results  Component Value Date   TSH 2.11 11/09/2019   Lab Results  Component Value Date   HGBA1C 5.9 11/09/2019    Assessment/Plan  1. Bruising -  Left pinky bruising, imaging showed no fracture - platelet is low at 110 -  Monitor  for bleeding or additional bruising  2. Diabetes mellitus with peripheral vascular disease (HCC) Lab Results  Component Value Date   HGBA1C 5.9 11/09/2019   -Diet controlled, podiatry consult  3. Essential hypertension -Stable, continue lisinopril  4. Major depression, recurrent, chronic (HCC) -Mood is stable, continue sertraline  5. Hypothyroidism due to acquired atrophy of thyroid Lab Results  Component Value Date   TSH 2.11 11/09/2019   -Continue levothyroxine  6. Poor appetite Wt Readings from Last 3 Encounters:  05/24/20 140 lb 6.4 oz (63.7 kg)  05/09/20 140 lb 6.4 oz (63.7 kg)  04/26/20 140 lb 6.4 oz (63.7 kg)   -Weight is stable, continue Remeron  7. Alzheimer's dementia without behavioral disturbance, unspecified timing of dementia onset (HCC) -Continue supportive care  8.  Pneumococcal vaccine indicated -Prevnar 13 vaccination   Family/ staff Communication: Discussed plan of care with resident and charge nurse.  Labs/tests ordered:  CBC  Goals of care:   Long-term care   05/11/20, DNP, MSN, FNP-BC [facility] [phone] (Monday-Friday 8:00 a.m. - 5:00 p.m.) [phone] (after hours)

## 2020-05-29 LAB — CBC AND DIFFERENTIAL
HCT: 35 — AB (ref 36–46)
Hemoglobin: 11.8 — AB (ref 12.0–16.0)
Neutrophils Absolute: 3
Platelets: 114 — AB (ref 150–399)
WBC: 4.9

## 2020-05-29 LAB — CBC: RBC: 3.67 — AB (ref 3.87–5.11)

## 2020-06-25 ENCOUNTER — Non-Acute Institutional Stay (SKILLED_NURSING_FACILITY): Payer: Medicare Other | Admitting: Adult Health

## 2020-06-25 ENCOUNTER — Encounter: Payer: Self-pay | Admitting: Adult Health

## 2020-06-25 DIAGNOSIS — F339 Major depressive disorder, recurrent, unspecified: Secondary | ICD-10-CM

## 2020-06-25 DIAGNOSIS — D696 Thrombocytopenia, unspecified: Secondary | ICD-10-CM | POA: Diagnosis not present

## 2020-06-25 DIAGNOSIS — I10 Essential (primary) hypertension: Secondary | ICD-10-CM | POA: Diagnosis not present

## 2020-06-25 DIAGNOSIS — R630 Anorexia: Secondary | ICD-10-CM

## 2020-06-25 DIAGNOSIS — E034 Atrophy of thyroid (acquired): Secondary | ICD-10-CM

## 2020-06-25 NOTE — Progress Notes (Signed)
Location:  [facility] Room Number: 102-B Place of Service:  SNF (31) Provider:  Kenard Gower, DNP, FNP-BC  Patient Care Team: Pecola Lawless, MD as PCP - General (Internal Medicine) Medina-Vargas, Margit Banda, NP as Nurse Practitioner (Internal Medicine)  Extended Emergency Contact Information Primary Emergency Contact: [name],[name] Address: [address]          [place], [place] [address] Darden [place] of [place] Home Phone: [phone] Mobile Phone: [phone] Relation: Daughter  Code Status:  DNR  Goals of care: Advanced Directive information Advanced Directives 06/25/2020  Does Patient Have a Medical Advance Directive? Yes  Type of Advance Directive Out of facility DNR (pink MOST or yellow form)  Does patient want to make changes to medical advance directive? No - Patient declined  Copy of Healthcare Power of Attorney in Chart? -  Would patient like information on creating a medical advance directive? -  Pre-existing out of facility DNR order (yellow form or pink MOST form) Yellow form placed in chart (order not valid for inpatient use)     Chief Complaint  Patient presents with   Medical Management of Chronic Issues    Routine [facility] visit    HPI:  Pt is an 86 y.o. female seen today for medical management of chronic diseases. She is a long-term care resident of [facility]. She has a PMH of Alzheimer's disease, CVA, depression, hypertension, diabetes mellitus and hypothyroidism. She was seen in the room today. No bruising nor bleeding noted. Latest platelet 114 and hgb 11.8, 05/29/20. SBPs ranging from 109 to 144. She currently takes lisinopril 5 mg daily for hypertension.    Past Medical History:  Diagnosis Date   Cerebrovascular disease    Dementia (HCC)    Depression    DM type 2 (diabetes mellitus, type 2) (HCC) 07/02/2011   Elevated LFTs    Frequent falls    HTN (hypertension) 07/02/2011     Thrombocytopenia (HCC)    UTI (lower urinary tract infection)    Past Surgical History:  Procedure Laterality Date   ABDOMINAL HYSTERECTOMY     CHOLECYSTECTOMY      No Known Allergies  Outpatient Encounter Medications as of 06/25/2020  Medication Sig   acetaminophen (TYLENOL) 325 MG tablet Take 650 mg by mouth every 6 (six) hours as needed.   aspirin EC 81 MG tablet Take 81 mg by mouth 2 (two) times daily.    bisacodyl (DULCOLAX) 10 MG suppository If not relieved by MOM, give 10 mg Bisacodyl suppositiory rectally X 1 dose in 24 hours as needed (Do not use constipation standing orders for residents with renal failure/CFR less than 30. Contact MD for orders) (Physician Order)   levothyroxine (SYNTHROID) 50 MCG tablet Take 50 mcg by mouth daily before breakfast.   lisinopril (ZESTRIL) 5 MG tablet Take 5 mg by mouth daily.   magnesium hydroxide (MILK OF MAGNESIA) 400 MG/5ML suspension If no BM in 3 days, give 30 cc Milk of Magnesium p.o. x 1 dose in 24 hours as needed (Do not use standing constipation orders for residents with renal failure CFR less than 30. Contact MD for orders) (Physician Order)   mirtazapine (REMERON) 15 MG tablet Take 15 mg by mouth at bedtime.   Multiple Vitamin (MULTIVITAMIN WITH MINERALS) TABS tablet Take 1 tablet by mouth 2 (two) times daily.   NON FORMULARY Mech soft diet   NON FORMULARY Take 1 each by mouth in the morning and at bedtime. Magic Cup  Nutritional Supplement LIQD Take 120 mLs by mouth daily. MedPass   nystatin (NYSTATIN) powder Apply 1 application topically in the morning, at noon, in the evening, and at bedtime.   nystatin cream (MYCOSTATIN) Apply 1 application topically 3 (three) times daily. Apply underneath bilateral breasts for rash   sertraline (ZOLOFT) 50 MG tablet Take 50 mg by mouth daily.   Sodium Phosphates (RA SALINE ENEMA RE) If not relieved by Biscodyl suppository, give disposable Saline Enema rectally X 1 dose/24 hrs  as needed (Do not use constipation standing orders for residents with renal failure/CFR less than 30. Contact MD for orders)(Physician Or   No facility-administered encounter medications on file as of 06/25/2020.    Review of Systems  GENERAL: No change in appetite, no fatigue, no weight changes, no fever, chills or weakness MOUTH and THROAT: Denies oral discomfort, gingival pain or bleeding RESPIRATORY: no cough, SOB, DOE, wheezing, hemoptysis CARDIAC: No chest pain, edema or palpitations GI: No abdominal pain, diarrhea, constipation, heart burn, nausea or vomiting GU: Denies dysuria, frequency, hematuria or discharge NEUROLOGICAL: Denies dizziness, syncope, numbness, or headache PSYCHIATRIC: Denies feelings of depression or anxiety. No report of hallucinations, insomnia, paranoia, or agitation   Immunization History  Administered Date(s) Administered   Influenza, High Dose Seasonal PF 06/29/2019   Moderna SARS-COVID-2 Vaccination 10/10/2019, 11/07/2019   Pneumococcal Polysaccharide-23 07/03/2011   Tdap 10/22/2012   Pertinent  Health Maintenance Due  Topic Date Due   FOOT EXAM  Never done   OPHTHALMOLOGY EXAM  Never done   PNA vac Low Risk Adult (2 of 2 - PCV13) 07/02/2012   INFLUENZA VACCINE  04/22/2020   HEMOGLOBIN A1C  05/08/2020   DEXA SCAN  Discontinued   Fall Risk  04/20/2019  Falls in the past year? (No Data)  Comment Emmi Telephone Survey: data to providers prior to load  Number falls in past yr: (No Data)  Comment Emmi Telephone Survey Actual Response =      Vitals:   06/25/20 1203  BP: (!) 118/54  Pulse: 62  Resp: 20  Temp: (!) 97.5 F (36.4 C)  TempSrc: Oral  Weight: 137 lb 6.4 oz (62.3 kg)  Height: 5\' 3"  (1.6 m)   Body mass index is 24.34 kg/m.  Physical Exam  GENERAL APPEARANCE: Well nourished. In no acute distress. Normal body habitus SKIN:  Skin is warm and dry. No bruising.  MOUTH and THROAT: Lips are without lesions. Oral mucosa is  moist and without lesions. Tongue is normal in shape, size, and color and without lesions RESPIRATORY: Breathing is even & unlabored, BS CTAB CARDIAC: RRR, no murmur,no extra heart sounds, no edema GI: Abdomen soft, normal BS, no masses, no tenderness NEUROLOGICAL: There is no tremor. Speech is clear. Alert to self, disoriented to time and place. PSYCHIATRIC:  Affect and behavior are appropriate  Labs reviewed: Recent Labs    07/11/19 1347 07/11/19 1347 07/12/19 0520 10/24/19 0000 04/03/20 0000  NA 138   < > 141 138 141   141  K 4.0   < > 4.1 4.5 4.3   4.3  CL 105   < > 113* 102 105   105  CO2 22   < > 22 25* 25*   25*  GLUCOSE 232*  --  150*  --   --   BUN 22   < > 23 17 15   15   CREATININE 1.13*   < > 0.81 0.6 0.5   0.5  CALCIUM 9.0   < >  8.1* 9.0 9.1   9.1   < > = values in this interval not displayed.   Recent Labs    07/11/19 1347 07/12/19 0520  AST 39 25  ALT 31 22  ALKPHOS 139* 95  BILITOT 2.2* 1.2  PROT 6.1* 4.9*  ALBUMIN 3.5 2.8*   Recent Labs    07/11/19 1347 07/11/19 1347 07/12/19 0520 07/26/19 0000 10/24/19 0000 11/09/19 0000 04/03/20 0000  WBC 9.6   < > 5.2   < > 1.4 4.5 4.1   4.1  NEUTROABS 8.8*  --   --   --   --  3 2   2   HGB 12.7   < > 9.9*   < > 12.1 11.0* 11.3*   11.3*  HCT 39.3   < > 30.8*   < > 35* 32* 34*   34*  MCV 97.8  --  98.7  --   --   --   --   PLT 69*   < > 40*   < > 68* 86* 110*   110*   < > = values in this interval not displayed.   Lab Results  Component Value Date   TSH 2.11 11/09/2019   Lab Results  Component Value Date   HGBA1C 5.9 11/09/2019    Assessment/Plan  1. Thrombocytopenia (HCC) Lab Results  Component Value Date   PLT 114 (A) 05/29/2020   -  Stable, no bruising nor bleeding  2. Essential hypertension - stable, continue Lisinopril  3. Hypothyroidism due to acquired atrophy of thyroid Lab Results  Component Value Date   TSH 2.11 11/09/2019   - continue Levothyroxine  4. Major depression,  recurrent, chronic (HCC) -  Mood is stable, continue Sertraline -  Followed by psych NP  5. Poor appetite Wt Readings from Last 3 Encounters:  06/25/20 137 lb 6.4 oz (62.3 kg)  05/24/20 140 lb 6.4 oz (63.7 kg)  05/09/20 140 lb 6.4 oz (63.7 kg)   -  Weight is stable -  Continue Remeron, Magic cup and Med pass      Family/ staff Communication:  Discussed plan of care with resident and charge nurse.  Labs/tests ordered:  None  Goals of care:   Long-term care   05/11/20, DNP, MSN, FNP-BC [facility] [phone] (Monday-Friday 8:00 a.m. - 5:00 p.m.) [phone] (after hours)

## 2020-07-04 ENCOUNTER — Non-Acute Institutional Stay (SKILLED_NURSING_FACILITY): Payer: Medicare Other | Admitting: Adult Health

## 2020-07-04 ENCOUNTER — Encounter: Payer: Self-pay | Admitting: Adult Health

## 2020-07-04 DIAGNOSIS — F339 Major depressive disorder, recurrent, unspecified: Secondary | ICD-10-CM

## 2020-07-04 DIAGNOSIS — R630 Anorexia: Secondary | ICD-10-CM

## 2020-07-04 DIAGNOSIS — E034 Atrophy of thyroid (acquired): Secondary | ICD-10-CM | POA: Diagnosis not present

## 2020-07-04 DIAGNOSIS — I10 Essential (primary) hypertension: Secondary | ICD-10-CM | POA: Diagnosis not present

## 2020-07-04 DIAGNOSIS — G309 Alzheimer's disease, unspecified: Secondary | ICD-10-CM

## 2020-07-04 DIAGNOSIS — Z7189 Other specified counseling: Secondary | ICD-10-CM | POA: Diagnosis not present

## 2020-07-04 DIAGNOSIS — F0280 Dementia in other diseases classified elsewhere without behavioral disturbance: Secondary | ICD-10-CM

## 2020-07-04 NOTE — Progress Notes (Signed)
Location:  [facility] Room Number: 102-B Place of Service:  SNF (31) Provider:  Kenard Gower, DNP, FNP-BC  Patient Care Team: Pecola Lawless, MD as PCP - General (Internal Medicine) Medina-Vargas, Margit Banda, NP as Nurse Practitioner (Internal Medicine)  Extended Emergency Contact Information Primary Emergency Contact: [name],[name] Address: [address]          [place], [place] [address] Darden Amber of [place] Home Phone: [phone] Mobile Phone: [phone] Relation: Daughter  Code Status:  DNR  Goals of care: Advanced Directive information Advanced Directives 07/04/2020  Does Patient Have a Medical Advance Directive? Yes  Type of Advance Directive Out of facility DNR (pink MOST or yellow form)  Does patient want to make changes to medical advance directive? No - Patient declined  Copy of Healthcare Power of Attorney in Chart? -  Would patient like information on creating a medical advance directive? -  Pre-existing out of facility DNR order (yellow form or pink MOST form) Yellow form placed in chart (order not valid for inpatient use)     Chief Complaint  Patient presents with  . Advanced Directive    Patient is seen for a Care Plan Meeting    HPI:  Pt is an 86 y.o. female seen today for a care plan meeting. She is a long-term care resident of [facility]. She has a PMH of Alzheimer's disease, CVA, depression, hypertension, diabetes mellitus and hypothyroidism. The meeting was attended by Gavin Pound, daughter, via teleconference, NP, MDS coordinator, Government social research officer. She remains to be DNR.  Discussed medications, vital signs and weights.  SBP's ranging from 100-123.  She is currently taking lisinopril 5 mg daily for hypertension.  Answered multiple medical questions from daughter. The meeting lasted for 25 minutes   Past Medical History:  Diagnosis Date  . Cerebrovascular disease   . Dementia  (HCC)   . Depression   . DM type 2 (diabetes mellitus, type 2) (HCC) 07/02/2011  . Elevated LFTs   . Frequent falls   . HTN (hypertension) 07/02/2011  . Thrombocytopenia (HCC)   . UTI (lower urinary tract infection)    Past Surgical History:  Procedure Laterality Date  . ABDOMINAL HYSTERECTOMY    . CHOLECYSTECTOMY      No Known Allergies  Outpatient Encounter Medications as of 07/04/2020  Medication Sig  . acetaminophen (TYLENOL) 325 MG tablet Take 650 mg by mouth every 6 (six) hours as needed.  Marland Kitchen aspirin EC 81 MG tablet Take 81 mg by mouth 2 (two) times daily.   . bisacodyl (DULCOLAX) 10 MG suppository If not relieved by MOM, give 10 mg Bisacodyl suppositiory rectally X 1 dose in 24 hours as needed (Do not use constipation standing orders for residents with renal failure/CFR less than 30. Contact MD for orders) (Physician Order)  . levothyroxine (SYNTHROID) 50 MCG tablet Take 50 mcg by mouth daily before breakfast.  . lisinopril (ZESTRIL) 5 MG tablet Take 5 mg by mouth daily.  . magnesium hydroxide (MILK OF MAGNESIA) 400 MG/5ML suspension If no BM in 3 days, give 30 cc Milk of Magnesium p.o. x 1 dose in 24 hours as needed (Do not use standing constipation orders for residents with renal failure CFR less than 30. Contact MD for orders) (Physician Order)  . mirtazapine (REMERON) 15 MG tablet Take 15 mg by mouth at bedtime.  . Multiple Vitamin (MULTIVITAMIN WITH MINERALS) TABS tablet Take 1 tablet by mouth 2 (two) times daily.  Marland Kitchen  NON FORMULARY Mech soft diet  . NON FORMULARY Take 1 each by mouth in the morning and at bedtime. Magic Cup  . Nutritional Supplement LIQD Take 120 mLs by mouth daily. MedPass  . nystatin (NYSTATIN) powder Apply 1 application topically in the morning, at noon, in the evening, and at bedtime.  . sertraline (ZOLOFT) 50 MG tablet Take 50 mg by mouth daily.  . Sodium Phosphates (RA SALINE ENEMA RE) If not relieved by Biscodyl suppository, give disposable Saline  Enema rectally X 1 dose/24 hrs as needed (Do not use constipation standing orders for residents with renal failure/CFR less than 30. Contact MD for orders)(Physician Or  . [DISCONTINUED] nystatin cream (MYCOSTATIN) Apply 1 application topically 3 (three) times daily. Apply underneath bilateral breasts for rash   No facility-administered encounter medications on file as of 07/04/2020.    Review of Systems  Unable to obtain due to dementia   Immunization History  Administered Date(s) Administered  . Influenza, High Dose Seasonal PF 06/29/2019  . Moderna SARS-COVID-2 Vaccination 10/10/2019, 11/07/2019  . Pneumococcal Polysaccharide-23 07/03/2011  . Tdap 10/22/2012   Pertinent  Health Maintenance Due  Topic Date Due  . PNA vac Low Risk Adult (2 of 2 - PCV13) 07/02/2012  . INFLUENZA VACCINE  12/20/2020 (Originally 04/22/2020)  . FOOT EXAM  Discontinued  . HEMOGLOBIN A1C  Discontinued  . OPHTHALMOLOGY EXAM  Discontinued  . DEXA SCAN  Discontinued   Fall Risk  04/20/2019  Falls in the past year? (No Data)  Comment Emmi Telephone Survey: data to providers prior to load  Number falls in past yr: (No Data)  Comment Emmi Telephone Survey Actual Response =      Vitals:   07/04/20 0837  BP: (!) 105/51  Pulse: 67  Resp: 20  Temp: 97.7 F (36.5 C)  TempSrc: Oral  Weight: 135 lb 3.2 oz (61.3 kg)  Height: 5\' 3"  (1.6 m)   Body mass index is 23.95 kg/m.  Physical Exam  GENERAL APPEARANCE: Well nourished. In no acute distress. Normal body habitus SKIN:  Skin is warm and dry.  MOUTH and THROAT: Lips are without lesions. Oral mucosa is moist and without lesions. Tongue is normal in shape, size, and color and without lesions RESPIRATORY: Breathing is even & unlabored, BS CTAB CARDIAC: RRR, no murmur,no extra heart sounds, no edema GI: Abdomen soft, normal BS, no masses, no tenderness NEUROLOGICAL: There is no tremor. Speech is clear. Alert to self, disoriented to time and  place. PSYCHIATRIC:  Affect and behavior are appropriate  Labs reviewed: Recent Labs    07/12/19 0520 10/24/19 0000 04/03/20 0000  NA 141 138 141  141  K 4.1 4.5 4.3  4.3  CL 113* 102 105  105  CO2 22 25* 25*  25*  GLUCOSE 150*  --   --   BUN 23 17 15  15   CREATININE 0.81 0.6 0.5  0.5  CALCIUM 8.1* 9.0 9.1  9.1   Recent Labs    07/12/19 0520  AST 25  ALT 22  ALKPHOS 95  BILITOT 1.2  PROT 4.9*  ALBUMIN 2.8*   Recent Labs    07/12/19 0520 07/26/19 0000 11/09/19 0000 04/03/20 0000 05/29/20 0000  WBC 5.2   < > 4.5 4.1  4.1 4.9  NEUTROABS  --   --  3 2  2 3   HGB 9.9*   < > 11.0* 11.3*  11.3* 11.8*  HCT 30.8*   < > 32* 34*  34* 35*  MCV 98.7  --   --   --   --   PLT 40*   < > 86* 110*  110* 114*   < > = values in this interval not displayed.   Lab Results  Component Value Date   TSH 2.11 11/09/2019   Lab Results  Component Value Date   HGBA1C 5.9 11/09/2019    Assessment/Plan  1. Advance care planning -Remains to be DNR -Discussed medications, vital signs and weights  2. Essential hypertension -  BPs low, will decrease Lisinopril from 5 mg to 2.5 mg daily -   Monitor BPs  3. Hypothyroidism due to acquired atrophy of thyroid Lab Results  Component Value Date   TSH 2.11 11/09/2019   -Continue levothyroxine  4. Major depression, recurrent, chronic (HCC) -  prefers to sleep in her room, continue sertraline -  Followed up by psych NP  5. Poor appetite Wt Readings from Last 3 Encounters:  07/04/20 135 lb 3.2 oz (61.3 kg)  06/25/20 137 lb 6.4 oz (62.3 kg)  05/24/20 140 lb 6.4 oz (63.7 kg)   -Stable, continue Remeron  6. Alzheimer's dementia without behavioral disturbance, unspecified timing of dementia onset (HCC) -  BIMS score 6/15, ranging in moderate cognitive impairment -  Continue supportive care fall precautions     Family/ staff Communication:   Discussed plan of care with daughter and IDT.  Labs/tests ordered:    None  Goals of care:   Long-term care   Kenard Gower, DNP, MSN, FNP-BC [facility] [phone] (Monday-Friday 8:00 a.m. - 5:00 p.m.) [phone] (after hours)

## 2020-07-23 ENCOUNTER — Encounter: Payer: Self-pay | Admitting: Adult Health

## 2020-07-23 ENCOUNTER — Non-Acute Institutional Stay (SKILLED_NURSING_FACILITY): Payer: Medicare Other | Admitting: Adult Health

## 2020-07-23 DIAGNOSIS — F339 Major depressive disorder, recurrent, unspecified: Secondary | ICD-10-CM | POA: Diagnosis not present

## 2020-07-23 DIAGNOSIS — E034 Atrophy of thyroid (acquired): Secondary | ICD-10-CM | POA: Diagnosis not present

## 2020-07-23 DIAGNOSIS — R630 Anorexia: Secondary | ICD-10-CM | POA: Diagnosis not present

## 2020-07-23 DIAGNOSIS — I10 Essential (primary) hypertension: Secondary | ICD-10-CM

## 2020-07-23 NOTE — Progress Notes (Signed)
Location:  [facility] Room Number: 102-B Place of Service:  SNF (31) Provider:  Kenard Gower, DNP, FNP-BC  Patient Care Team: Pecola Lawless, MD as PCP - General (Internal Medicine) Medina-Vargas, Margit Banda, NP as Nurse Practitioner (Internal Medicine)  Extended Emergency Contact Information Primary Emergency Contact: [name],[name] Address: [address]          [place], [place] [address] Darden [place] of [place] Home Phone: [phone] Mobile Phone: [phone] Relation: Daughter  Code Status:  DNR  Goals of care: Advanced Directive information Advanced Directives 07/04/2020  Does Patient Have a Medical Advance Directive? Yes  Type of Advance Directive Out of facility DNR (pink MOST or yellow form)  Does patient want to make changes to medical advance directive? No - Patient declined  Copy of Healthcare Power of Attorney in Chart? -  Would patient like information on creating a medical advance directive? -  Pre-existing out of facility DNR order (yellow form or pink MOST form) Yellow form placed in chart (order not valid for inpatient use)     Chief Complaint  Patient presents with   Medical Management of Chronic Issues    Routine [facility] visit    HPI:  Pt is an 86 y.o. female seen today for medical management of chronic diseases.  She is a long-term care resident of [facility].  She has a PMH of Alzheimer's disease, CVA, depression, hypertension, diabetes mellitus and hypothyroidism.  SBPs ranging from 108-127.  He is currently taking lisinopril 2.5 mg daily for hypertension.  She was seen today.  She asked," when the repeat?  Was made by me?  What are you going to eat?"  She is currently taking Remeron 15 mg 1 tab at bedtime to increase her appetite.   Past Medical History:  Diagnosis Date   Cerebrovascular disease    Dementia (HCC)    Depression    DM type 2 (diabetes mellitus, type 2) (HCC)  07/02/2011   Elevated LFTs    Frequent falls    HTN (hypertension) 07/02/2011   Thrombocytopenia (HCC)    UTI (lower urinary tract infection)    Past Surgical History:  Procedure Laterality Date   ABDOMINAL HYSTERECTOMY     CHOLECYSTECTOMY      No Known Allergies  Outpatient Encounter Medications as of 07/23/2020  Medication Sig   acetaminophen (TYLENOL) 325 MG tablet Take 650 mg by mouth every 6 (six) hours as needed.   aspirin EC 81 MG tablet Take 81 mg by mouth 2 (two) times daily.    bisacodyl (DULCOLAX) 10 MG suppository If not relieved by MOM, give 10 mg Bisacodyl suppositiory rectally X 1 dose in 24 hours as needed (Do not use constipation standing orders for residents with renal failure/CFR less than 30. Contact MD for orders) (Physician Order)   levothyroxine (SYNTHROID) 50 MCG tablet Take 50 mcg by mouth daily before breakfast.   lisinopril (ZESTRIL) 5 MG tablet Take 5 mg by mouth daily.   magnesium hydroxide (MILK OF MAGNESIA) 400 MG/5ML suspension If no BM in 3 days, give 30 cc Milk of Magnesium p.o. x 1 dose in 24 hours as needed (Do not use standing constipation orders for residents with renal failure CFR less than 30. Contact MD for orders) (Physician Order)   mirtazapine (REMERON) 15 MG tablet Take 15 mg by mouth at bedtime.   Multiple Vitamin (MULTIVITAMIN WITH MINERALS) TABS tablet Take 1 tablet by mouth 2 (two) times daily.   NON  FORMULARY Mech soft diet   NON FORMULARY Take 1 each by mouth in the morning and at bedtime. Magic Cup   Nutritional Supplement LIQD Take 120 mLs by mouth daily. MedPass   nystatin (NYSTATIN) powder Apply 1 application topically in the morning, at noon, in the evening, and at bedtime.   sertraline (ZOLOFT) 50 MG tablet Take 50 mg by mouth daily.   Sodium Phosphates (RA SALINE ENEMA RE) If not relieved by Biscodyl suppository, give disposable Saline Enema rectally X 1 dose/24 hrs as needed (Do not use constipation standing  orders for residents with renal failure/CFR less than 30. Contact MD for orders)(Physician Or   No facility-administered encounter medications on file as of 07/23/2020.    Review of Systems   Unable to obtain due to dementia    Immunization History  Administered Date(s) Administered   Influenza, High Dose Seasonal PF 06/29/2019   Moderna SARS-COVID-2 Vaccination 10/10/2019, 11/07/2019   Pneumococcal Polysaccharide-23 07/03/2011   Tdap 10/22/2012   Pertinent  Health Maintenance Due  Topic Date Due   PNA vac Low Risk Adult (2 of 2 - PCV13) 07/02/2012   INFLUENZA VACCINE  12/20/2020 (Originally 04/22/2020)   FOOT EXAM  Discontinued   HEMOGLOBIN A1C  Discontinued   OPHTHALMOLOGY EXAM  Discontinued   DEXA SCAN  Discontinued   Fall Risk  04/20/2019  Falls in the past year? (No Data)  Comment Emmi Telephone Survey: data to providers prior to load  Number falls in past yr: (No Data)  Comment Emmi Telephone Survey Actual Response =      Vitals:   07/23/20 1118  BP: (!) 108/51  Pulse: 63  Resp: 18  Temp: (!) 97.4 F (36.3 C)  TempSrc: Oral  Weight: 135 lb 3.2 oz (61.3 kg)  Height: 5\' 3"  (1.6 m)   Body mass index is 23.95 kg/m.  Physical Exam  GENERAL APPEARANCE: Well nourished. In no acute distress. Normal body habitus SKIN:  Skin is warm and dry.  MOUTH and THROAT: Lips are without lesions. Oral mucosa is moist and without lesions. Tongue is normal in shape, size, and color and without lesions RESPIRATORY: Breathing is even & unlabored, BS CTAB CARDIAC: RRR, no murmur,no extra heart sounds, no edema GI: Abdomen soft, normal BS, no masses, no tenderness NEUROLOGICAL: There is no tremor. Speech is clear PSYCHIATRIC: Alert and oriented X 3. Affect and behavior are appropriate  Labs reviewed: Recent Labs    10/24/19 0000 04/03/20 0000  NA 138 141   141  K 4.5 4.3   4.3  CL 102 105   105  CO2 25* 25*   25*  BUN 17 15   15   CREATININE 0.6 0.5   0.5  CALCIUM  9.0 9.1   9.1    Recent Labs    11/09/19 0000 04/03/20 0000 05/29/20 0000  WBC 4.5 4.1   4.1 4.9  NEUTROABS 3 2   2 3   HGB 11.0* 11.3*   11.3* 11.8*  HCT 32* 34*   34* 35*  PLT 86* 110*   110* 114*   Lab Results  Component Value Date   TSH 2.11 11/09/2019   Lab Results  Component Value Date   HGBA1C 5.9 11/09/2019    Assessment/Plan  1. Essential hypertension -  BPs mostly low or normal -  Will discontinue Lisinopril  2. Poor appetite Wt Readings from Last 3 Encounters:  07/23/20 135 lb 3.2 oz (61.3 kg)  07/04/20 135 lb 3.2 oz (61.3 kg)  06/25/20 137 lb 6.4 oz (62.3 kg)   - appetite improved, weight is stable -  Continue Remeron  3. Hypothyroidism due to acquired atrophy of thyroid Lab Results  Component Value Date   TSH 2.11 11/09/2019   -Continue levothyroxine  4. Depression, recurrent (HCC) -  Mood is stable, continue sertraline -   Followed by psych NP   Family/ staff Communication: Discussed plan of care with resident and charge nurse.  Labs/tests ordered: None  Goals of care:   Long-term care   Kenard Gower, DNP, MSN, FNP-BC [facility] [phone] (Monday-Friday 8:00 a.m. - 5:00 p.m.) [phone] (after hours)

## 2020-08-31 ENCOUNTER — Non-Acute Institutional Stay (SKILLED_NURSING_FACILITY): Payer: Medicare Other | Admitting: Adult Health

## 2020-08-31 ENCOUNTER — Encounter: Payer: Self-pay | Admitting: Adult Health

## 2020-08-31 DIAGNOSIS — R630 Anorexia: Secondary | ICD-10-CM

## 2020-08-31 DIAGNOSIS — I10 Essential (primary) hypertension: Secondary | ICD-10-CM

## 2020-08-31 DIAGNOSIS — G309 Alzheimer's disease, unspecified: Secondary | ICD-10-CM

## 2020-08-31 DIAGNOSIS — F339 Major depressive disorder, recurrent, unspecified: Secondary | ICD-10-CM

## 2020-08-31 DIAGNOSIS — E034 Atrophy of thyroid (acquired): Secondary | ICD-10-CM | POA: Diagnosis not present

## 2020-08-31 DIAGNOSIS — F0280 Dementia in other diseases classified elsewhere without behavioral disturbance: Secondary | ICD-10-CM

## 2020-08-31 NOTE — Progress Notes (Signed)
Location:  [facility] Room Number: 102-B Place of Service:  SNF (31) Provider:  Kenard Gower, DNP, FNP-BC  Patient Care Team: Pecola Lawless, MD as PCP - General (Internal Medicine) Medina-Vargas, Margit Banda, NP as Nurse Practitioner (Internal Medicine)  Extended Emergency Contact Information Primary Emergency Contact: [name],[name] Address: [address]          [place], [place] [address] [place] of [place] Home Phone: [phone] Mobile Phone: [phone] Relation: Daughter  Code Status:  DNR  Goals of care: Advanced Directive information Advanced Directives 07/23/2020  Does Patient Have a Medical Advance Directive? Yes  Type of Advance Directive Out of facility DNR (pink MOST or yellow form)  Does patient want to make changes to medical advance directive? No - Patient declined  Copy of Healthcare Power of Attorney in Chart? -  Would patient like information on creating a medical advance directive? -  Pre-existing out of facility DNR order (yellow form or pink MOST form) Yellow form placed in chart (order not valid for inpatient use)     Chief Complaint  Patient presents with  . Medical Management of Chronic Issues    Routine [facility] visit    HPI:  Pt is an 86 y.o. female seen today for medical management of chronic diseases.  She is a long-term care resident of [facility].  She has a PMH of Alzheimer's disease, CVA, depression, hypertension, diabetes mellitus and hypothyroidism. Latest tsh 2.11, 11/09/19. She takes levothyroxine 50 mcg 1 tab daily for hypothyroidism.  Latest weight 132.8 lbs, stable weight. She takes Mirtazapine 15 mg 1 tab at bedtime to increase appetite. SBPs 115 to 138. She is not taking any hypertensive medications.   Past Medical History:  Diagnosis Date  . Cerebrovascular disease   . Dementia (HCC)   . Depression   . DM type 2 (diabetes mellitus, type 2) (HCC) 07/02/2011  .  Elevated LFTs   . Frequent falls   . HTN (hypertension) 07/02/2011  . Thrombocytopenia (HCC)   . UTI (lower urinary tract infection)    Past Surgical History:  Procedure Laterality Date  . ABDOMINAL HYSTERECTOMY    . CHOLECYSTECTOMY      No Known Allergies  Outpatient Encounter Medications as of 08/31/2020  Medication Sig  . acetaminophen (TYLENOL) 325 MG tablet Take 650 mg by mouth every 6 (six) hours as needed.  Marland Kitchen aspirin EC 81 MG tablet Take 81 mg by mouth 2 (two) times daily.   . bisacodyl (DULCOLAX) 10 MG suppository If not relieved by MOM, give 10 mg Bisacodyl suppositiory rectally X 1 dose in 24 hours as needed (Do not use constipation standing orders for residents with renal failure/CFR less than 30. Contact MD for orders) (Physician Order)  . levothyroxine (SYNTHROID) 50 MCG tablet Take 50 mcg by mouth daily before breakfast.  . lisinopril (ZESTRIL) 2.5 MG tablet Take 2.5 mg by mouth daily.  . magnesium hydroxide (MILK OF MAGNESIA) 400 MG/5ML suspension If no BM in 3 days, give 30 cc Milk of Magnesium p.o. x 1 dose in 24 hours as needed (Do not use standing constipation orders for residents with renal failure CFR less than 30. Contact MD for orders) (Physician Order)  . mirtazapine (REMERON) 15 MG tablet Take 15 mg by mouth at bedtime.  . Multiple Vitamin (MULTIVITAMIN WITH MINERALS) TABS tablet Take 1 tablet by mouth 2 (two) times daily.  . NON FORMULARY Mech soft diet  . NON FORMULARY Take 1 each  by mouth in the morning and at bedtime. Magic Cup  . Nutritional Supplement LIQD Take 120 mLs by mouth daily. MedPass  . nystatin (NYSTATIN) powder Apply 1 application topically in the morning, at noon, in the evening, and at bedtime.  . sertraline (ZOLOFT) 25 MG tablet Take 25 mg by mouth daily.  . Sodium Phosphates (RA SALINE ENEMA RE) If not relieved by Biscodyl suppository, give disposable Saline Enema rectally X 1 dose/24 hrs as needed (Do not use constipation standing orders for  residents with renal failure/CFR less than 30. Contact MD for orders)(Physician Or   No facility-administered encounter medications on file as of 08/31/2020.    Review of Systems  GENERAL: No change in appetite, no fatigue, no weight changes, no fever, chills or weakness MOUTH and THROAT: Denies oral discomfort, gingival pain or bleeding RESPIRATORY: no cough, SOB, DOE, wheezing, hemoptysis CARDIAC: No chest pain, edema or palpitations GI: No abdominal pain, diarrhea, constipation, heart burn, nausea or vomiting GU: Denies dysuria, frequency, hematuria or discharge NEUROLOGICAL: Denies dizziness, syncope, numbness, or headache PSYCHIATRIC: Denies feelings of depression or anxiety. No report of hallucinations, insomnia, paranoia, or agitation   Immunization History  Administered Date(s) Administered  . Influenza, High Dose Seasonal PF 06/29/2019  . Influenza-Unspecified 07/12/2020  . Moderna SARS-COVID-2 Vaccination 10/10/2019, 11/07/2019  . Pneumococcal Polysaccharide-23 07/03/2011  . Tdap 10/22/2012   Pertinent  Health Maintenance Due  Topic Date Due  . PNA vac Low Risk Adult (2 of 2 - PCV13) 07/02/2012  . INFLUENZA VACCINE  Completed  . FOOT EXAM  Discontinued  . HEMOGLOBIN A1C  Discontinued  . OPHTHALMOLOGY EXAM  Discontinued  . DEXA SCAN  Discontinued   Fall Risk  04/20/2019  Falls in the past year? (No Data)  Comment Emmi Telephone Survey: data to providers prior to load  Number falls in past yr: (No Data)  Comment Emmi Telephone Survey Actual Response =      Vitals:   08/31/20 0939  BP: (!) 138/55  Pulse: 78  Resp: 18  Temp: 97.7 F (36.5 C)  TempSrc: Oral  Weight: 132 lb 12.8 oz (60.2 kg)  Height: 5\' 3"  (1.6 m)   Body mass index is 23.52 kg/m.  Physical Exam  GENERAL APPEARANCE: Well nourished. In no acute distress. Normal body habitus SKIN:  Skin is warm and dry.  MOUTH and THROAT: Lips are without lesions. Oral mucosa is moist and without lesions.  Tongue is normal in shape, size, and color and without lesions RESPIRATORY: Breathing is even & unlabored, BS CTAB CARDIAC: RRR, no murmur,no extra heart sounds, no edema GI: Abdomen soft, normal BS, no masses, no tenderness EXTREMITIES:  Able to move x4 extremities NEUROLOGICAL: There is no tremor. Speech is clear.  Alert to self, disoriented to time and place PSYCHIATRIC: Affect and behavior are appropriate  Labs reviewed: Recent Labs    10/24/19 0000 04/03/20 0000  NA 138 141  141  K 4.5 4.3  4.3  CL 102 105  105  CO2 25* 25*  25*  BUN 17 15  15   CREATININE 0.6 0.5  0.5  CALCIUM 9.0 9.1  9.1    Recent Labs    11/09/19 0000 04/03/20 0000 05/29/20 0000  WBC 4.5 4.1  4.1 4.9  NEUTROABS 3 2  2 3   HGB 11.0* 11.3*  11.3* 11.8*  HCT 32* 34*  34* 35*  PLT 86* 110*  110* 114*   Lab Results  Component Value Date   TSH 2.11 11/09/2019  Lab Results  Component Value Date   HGBA1C 5.9 11/09/2019    Assessment/Plan  1. Hypothyroidism due to acquired atrophy of thyroid Lab Results  Component Value Date   TSH 2.11 11/09/2019   -  Continue levothyroxine  2. Essential hypertension -  Stable, not on any medications  3. Poor appetite -  Weight is a stable, continue Remeron  4. Depression, recurrent (HCC) -  Mood is stable, continue sertraline  5. Alzheimer's dementia without behavioral disturbance, unspecified timing of dementia onset (HCC) -  BIMS score 6/15, ranging in severe cognitive impairment -  Continue supportive care and fall precautions     Family/ staff Communication: Discussed plan of care with resident and charge nurse.  Labs/tests ordered: None  Goals of care:   Long-term care   Kenard Gower, DNP, MSN, FNP-BC [facility] [phone] (Monday-Friday 8:00 a.m. - 5:00 p.m.) [phone] (after hours)

## 2020-09-24 ENCOUNTER — Non-Acute Institutional Stay (SKILLED_NURSING_FACILITY): Payer: Medicare Other | Admitting: Adult Health

## 2020-09-24 ENCOUNTER — Encounter: Payer: Self-pay | Admitting: Adult Health

## 2020-09-24 DIAGNOSIS — F339 Major depressive disorder, recurrent, unspecified: Secondary | ICD-10-CM | POA: Diagnosis not present

## 2020-09-24 DIAGNOSIS — E034 Atrophy of thyroid (acquired): Secondary | ICD-10-CM | POA: Diagnosis not present

## 2020-09-24 DIAGNOSIS — E1151 Type 2 diabetes mellitus with diabetic peripheral angiopathy without gangrene: Secondary | ICD-10-CM

## 2020-09-24 DIAGNOSIS — F0280 Dementia in other diseases classified elsewhere without behavioral disturbance: Secondary | ICD-10-CM

## 2020-09-24 DIAGNOSIS — G309 Alzheimer's disease, unspecified: Secondary | ICD-10-CM | POA: Diagnosis not present

## 2020-09-24 NOTE — Progress Notes (Signed)
Location:  [facility] Room Number: 102 B Place of Service:  SNF (31) Provider:  Kenard Gower, DNP, FNP-BC  Patient Care Team: Pecola Lawless, MD as PCP - General (Internal Medicine) Medina-Vargas, Margit Banda, NP as Nurse Practitioner (Internal Medicine)  Extended Emergency Contact Information Primary Emergency Contact: [name],[name] Address: [address]          [place], [place] [address] Darden Amber of [place] Home Phone: [phone] Mobile Phone: [phone] Relation: Daughter  Code Status:  DNR  Goals of care: Advanced Directive information Advanced Directives 08/31/2020  Does Patient Have a Medical Advance Directive? Yes  Type of Advance Directive Out of facility DNR (pink MOST or yellow form)  Does patient want to make changes to medical advance directive? No - Patient declined  Copy of Healthcare Power of Attorney in Chart? -  Would patient like information on creating a medical advance directive? -  Pre-existing out of facility DNR order (yellow form or pink MOST form) Yellow form placed in chart (order not valid for inpatient use)     Chief Complaint  Patient presents with  . Medical Management of Chronic Issues    HPI:  Pt is a 87 y.o. female seen today for medical management of chronic diseases.  She is a long-term care resident of [facility].  She has a PMH of Alzheimer's disease, CVA, depression, hypertension, diabetes mellitus and hypothyroidism. Latest A1C 5.9, 11/09/19. She is not on any medication for diabetes mellitus. Latest BIMS score 6/15, ranging in severe cognitive impairment.   Past Medical History:  Diagnosis Date  . Cerebrovascular disease   . Dementia (HCC)   . Depression   . DM type 2 (diabetes mellitus, type 2) (HCC) 07/02/2011  . Elevated LFTs   . Frequent falls   . HTN (hypertension) 07/02/2011  . Thrombocytopenia (HCC)   . UTI (lower urinary tract infection)    Past Surgical  History:  Procedure Laterality Date  . ABDOMINAL HYSTERECTOMY    . CHOLECYSTECTOMY      No Known Allergies  Outpatient Encounter Medications as of 09/24/2020  Medication Sig  . acetaminophen (TYLENOL) 325 MG tablet Take 650 mg by mouth every 6 (six) hours as needed.  Marland Kitchen aspirin EC 81 MG tablet Take 81 mg by mouth 2 (two) times daily.   . bisacodyl (DULCOLAX) 10 MG suppository If not relieved by MOM, give 10 mg Bisacodyl suppositiory rectally X 1 dose in 24 hours as needed (Do not use constipation standing orders for residents with renal failure/CFR less than 30. Contact MD for orders) (Physician Order)  . levothyroxine (SYNTHROID) 50 MCG tablet Take 50 mcg by mouth daily before breakfast.  . magnesium hydroxide (MILK OF MAGNESIA) 400 MG/5ML suspension If no BM in 3 days, give 30 cc Milk of Magnesium p.o. x 1 dose in 24 hours as needed (Do not use standing constipation orders for residents with renal failure CFR less than 30. Contact MD for orders) (Physician Order)  . mirtazapine (REMERON) 15 MG tablet Take 15 mg by mouth at bedtime.  . Multiple Vitamin (MULTIVITAMIN WITH MINERALS) TABS tablet Take 1 tablet by mouth 2 (two) times daily.  . NON FORMULARY Mech soft diet  . NON FORMULARY Take 1 each by mouth in the morning and at bedtime. Magic Cup  . Nutritional Supplement LIQD Take 120 mLs by mouth daily. MedPass  . nystatin (MYCOSTATIN/NYSTOP) powder Apply 1 application topically in the morning, at noon, in the evening, and at  bedtime.  . sertraline (ZOLOFT) 25 MG tablet Take 25 mg by mouth daily.  . Sodium Phosphates (RA SALINE ENEMA RE) If not relieved by Biscodyl suppository, give disposable Saline Enema rectally X 1 dose/24 hrs as needed (Do not use constipation standing orders for residents with renal failure/CFR less than 30. Contact MD for orders)(Physician Or   No facility-administered encounter medications on file as of 09/24/2020.    Review of Systems  GENERAL: No change in  appetite, no fatigue, no fever, chills or weakness MOUTH and THROAT: Denies oral discomfort, gingival pain or bleeding RESPIRATORY: no cough, SOB, DOE, wheezing, hemoptysis CARDIAC: No chest pain, edema or palpitations GI: No abdominal pain, diarrhea, constipation, heart burn, nausea or vomiting GU: Denies dysuria, frequency, hematuria, or discharge NEUROLOGICAL: Denies dizziness, syncope, numbness, or headache PSYCHIATRIC: Denies feelings of depression or anxiety. No report of hallucinations, insomnia, paranoia, or agitation   Immunization History  Administered Date(s) Administered  . Influenza, High Dose Seasonal PF 06/29/2019  . Influenza-Unspecified 07/12/2020  . Moderna Sars-Covid-2 Vaccination 10/10/2019, 11/07/2019, 08/28/2020  . Pneumococcal Polysaccharide-23 07/03/2011  . Tdap 10/22/2012   Pertinent  Health Maintenance Due  Topic Date Due  . URINE MICROALBUMIN  Never done  . PNA vac Low Risk Adult (2 of 2 - PCV13) 07/02/2012  . INFLUENZA VACCINE  Completed  . FOOT EXAM  Discontinued  . HEMOGLOBIN A1C  Discontinued  . OPHTHALMOLOGY EXAM  Discontinued  . DEXA SCAN  Discontinued   Fall Risk  04/20/2019  Falls in the past year? (No Data)  Comment Emmi Telephone Survey: data to providers prior to load  Number falls in past yr: (No Data)  Comment Emmi Telephone Survey Actual Response =      Vitals:   09/24/20 1552  BP: (!) 117/56  Pulse: 64  Resp: 20  Temp: (!) 97 F (36.1 C)  Weight: 132 lb 12.8 oz (60.2 kg)  Height: 5\' 3"  (1.6 m)   Body mass index is 23.52 kg/m.  Physical Exam  GENERAL APPEARANCE: Well nourished. In no acute distress. Normal body habitus SKIN:  Skin is warm and dry.  MOUTH and THROAT: Lips are without lesions. Oral mucosa is moist and without lesions.  RESPIRATORY: Breathing is even & unlabored, BS CTAB CARDIAC: RRR, no murmur,no extra heart sounds, no edema GI: Abdomen soft, normal BS, no masses, no tenderness EXTREMITIES: Able to move X  4 extremities NEUROLOGICAL: There is no tremor. Speech is clear. Alert to self, disoriented to time and place. PSYCHIATRIC:  Affect and behavior are appropriate  Labs reviewed: Recent Labs    10/24/19 0000 04/03/20 0000  NA 138 141  141  K 4.5 4.3  4.3  CL 102 105  105  CO2 25* 25*  25*  BUN 17 15  15   CREATININE 0.6 0.5  0.5  CALCIUM 9.0 9.1  9.1   No results for input(s): AST, ALT, ALKPHOS, BILITOT, PROT, ALBUMIN in the last 8760 hours. Recent Labs    11/09/19 0000 04/03/20 0000 05/29/20 0000  WBC 4.5 4.1  4.1 4.9  NEUTROABS 3 2  2 3   HGB 11.0* 11.3*  11.3* 11.8*  HCT 32* 34*  34* 35*  PLT 86* 110*  110* 114*   Lab Results  Component Value Date   TSH 2.11 11/09/2019   Lab Results  Component Value Date   HGBA1C 5.9 11/09/2019    Assessment/Plan  1. Diabetes mellitus with peripheral vascular disease [facility]) Lab Results  Component Value Date  HGBA1C 5.9 11/09/2019   -  Diet controlled  2. Hypothyroidism due to acquired atrophy of thyroid Lab Results  Component Value Date   TSH 2.11 11/09/2019   -Continue levothyroxine 50 mcg daily  3. Depression, recurrent (HCC) -Mood is stable, continue sertraline  4. Alzheimer's dementia without behavioral disturbance, unspecified timing of dementia onset (HCC) - BIMS score 6/15, ranging in severe cognitive impairment -Continue supportive care    Family/ staff Communication: Discussed plan of care with resident and charge nurse.  Labs/tests ordered: None  Goals of care:   Long-term care   Kenard Gower, DNP, MSN, FNP-BC [facility] [phone] (Monday-Friday 8:00 a.m. - 5:00 p.m.) [phone] (after hours)

## 2020-09-24 NOTE — Progress Notes (Incomplete)
Location:  [facility] Room Number: 102 B Place of Service:  SNF (31) Provider:  Kenard Gower, DNP, FNP-BC  Patient Care Team: Pecola Lawless, MD as PCP - General (Internal Medicine) Medina-Vargas, Margit Banda, NP as Nurse Practitioner (Internal Medicine)  Extended Emergency Contact Information Primary Emergency Contact: [name],[name] Address: [address]          [place], [place] [address] Darden Amber of [place] Home Phone: [phone] Mobile Phone: [phone] Relation: Daughter  Code Status:  DNR  Goals of care: Advanced Directive information Advanced Directives 08/31/2020  Does Patient Have a Medical Advance Directive? Yes  Type of Advance Directive Out of facility DNR (pink MOST or yellow form)  Does patient want to make changes to medical advance directive? No - Patient declined  Copy of Healthcare Power of Attorney in Chart? -  Would patient like information on creating a medical advance directive? -  Pre-existing out of facility DNR order (yellow form or pink MOST form) Yellow form placed in chart (order not valid for inpatient use)     Chief Complaint  Patient presents with  . Medical Management of Chronic Issues    HPI:  Pt is a 87 y.o. female seen today for medical management of chronic diseases.  She is a long-term care resident of [facility].  She has a PMH of Alzheimer's disease, CVA, depression, hypertension, diabetes mellitus and hypothyroidism.   Past Medical History:  Diagnosis Date  . Cerebrovascular disease   . Dementia (HCC)   . Depression   . DM type 2 (diabetes mellitus, type 2) (HCC) 07/02/2011  . Elevated LFTs   . Frequent falls   . HTN (hypertension) 07/02/2011  . Thrombocytopenia (HCC)   . UTI (lower urinary tract infection)    Past Surgical History:  Procedure Laterality Date  . ABDOMINAL HYSTERECTOMY    . CHOLECYSTECTOMY      No Known Allergies  Outpatient Encounter  Medications as of 09/24/2020  Medication Sig  . acetaminophen (TYLENOL) 325 MG tablet Take 650 mg by mouth every 6 (six) hours as needed.  Marland Kitchen aspirin EC 81 MG tablet Take 81 mg by mouth 2 (two) times daily.   . bisacodyl (DULCOLAX) 10 MG suppository If not relieved by MOM, give 10 mg Bisacodyl suppositiory rectally X 1 dose in 24 hours as needed (Do not use constipation standing orders for residents with renal failure/CFR less than 30. Contact MD for orders) (Physician Order)  . levothyroxine (SYNTHROID) 50 MCG tablet Take 50 mcg by mouth daily before breakfast.  . magnesium hydroxide (MILK OF MAGNESIA) 400 MG/5ML suspension If no BM in 3 days, give 30 cc Milk of Magnesium p.o. x 1 dose in 24 hours as needed (Do not use standing constipation orders for residents with renal failure CFR less than 30. Contact MD for orders) (Physician Order)  . mirtazapine (REMERON) 15 MG tablet Take 15 mg by mouth at bedtime.  . Multiple Vitamin (MULTIVITAMIN WITH MINERALS) TABS tablet Take 1 tablet by mouth 2 (two) times daily.  . NON FORMULARY Mech soft diet  . NON FORMULARY Take 1 each by mouth in the morning and at bedtime. Magic Cup  . Nutritional Supplement LIQD Take 120 mLs by mouth daily. MedPass  . nystatin (MYCOSTATIN/NYSTOP) powder Apply 1 application topically in the morning, at noon, in the evening, and at bedtime.  . sertraline (ZOLOFT) 25 MG tablet Take 25 mg by mouth daily.  . Sodium Phosphates (RA SALINE ENEMA RE)  If not relieved by Biscodyl suppository, give disposable Saline Enema rectally X 1 dose/24 hrs as needed (Do not use constipation standing orders for residents with renal failure/CFR less than 30. Contact MD for orders)(Physician Or   No facility-administered encounter medications on file as of 09/24/2020.    Review of Systems  GENERAL: No change in appetite, no fatigue, no weight changes, no fever, chills or weakness SKIN: Denies rash, itching, wounds, ulcer sores, or nail  abnormalities EYES: Denies change in vision, dry eyes, eye pain, itching or discharge EARS: Denies change in hearing, ringing in ears, or earache NOSE: Denies nasal congestion or epistaxis MOUTH and THROAT: Denies oral discomfort, gingival pain or bleeding, pain from teeth or hoarseness   RESPIRATORY: no cough, SOB, DOE, wheezing, hemoptysis CARDIAC: No chest pain, edema or palpitations GI: No abdominal pain, diarrhea, constipation, heart burn, nausea or vomiting GU: Denies dysuria, frequency, hematuria, incontinence, or discharge MUSCULOSKELETAL: Denies joint pain, muscle pain, back pain, restricted movement, or unusual weakness CIRCULATION: Denies claudication, edema of legs, varicosities, or cold extremities NEUROLOGICAL: Denies dizziness, syncope, numbness, or headache PSYCHIATRIC: Denies feelings of depression or anxiety. No report of hallucinations, insomnia, paranoia, or agitation ENDOCRINE: Denies polyphagia, polyuria, polydipsia, heat or cold intolerance HEME/LYMPH: Denies excessive bruising, petechia, enlarged lymph nodes, or bleeding problems IMMUNOLOGIC: Denies history of frequent infections, AIDS, or use of immunosuppressive agents   Immunization History  Administered Date(s) Administered  . Influenza, High Dose Seasonal PF 06/29/2019  . Influenza-Unspecified 07/12/2020  . Moderna Sars-Covid-2 Vaccination 10/10/2019, 11/07/2019, 08/28/2020  . Pneumococcal Polysaccharide-23 07/03/2011  . Tdap 10/22/2012   Pertinent  Health Maintenance Due  Topic Date Due  . URINE MICROALBUMIN  Never done  . PNA vac Low Risk Adult (2 of 2 - PCV13) 07/02/2012  . INFLUENZA VACCINE  Completed  . FOOT EXAM  Discontinued  . HEMOGLOBIN A1C  Discontinued  . OPHTHALMOLOGY EXAM  Discontinued  . DEXA SCAN  Discontinued   Fall Risk  04/20/2019  Falls in the past year? (No Data)  Comment Emmi Telephone Survey: data to providers prior to load  Number falls in past yr: (No Data)  Comment Emmi  Telephone Survey Actual Response =      Vitals:   09/24/20 1552  BP: (!) 117/56  Pulse: 64  Resp: 20  Temp: (!) 97 F (36.1 C)  Weight: 132 lb 12.8 oz (60.2 kg)  Height: 5\' 3"  (1.6 m)   Body mass index is 23.52 kg/m.  Physical Exam  GENERAL APPEARANCE: Well nourished. In no acute distress. Normal body habitus SKIN:  Skin is warm and dry. There are no suspicious lesions or rash HEAD: Normal in size and contour. No evidence of trauma EYES: Lids open and close normally. No blepharitis, entropion or ectropion. PERRL. Conjunctivae are clear and sclerae are white. Lenses are without opacity EARS: Pinnae are normal. Patient hears normal voice tunes of the examiner MOUTH and THROAT: Lips are without lesions. Oral mucosa is moist and without lesions. Tongue is normal in shape, size, and color and without lesions NECK: supple, trachea midline, no neck masses, no thyroid tenderness, no thyromegaly LYMPHATICS: No LAN in the neck, no supraclavicular LAN RESPIRATORY: Breathing is even & unlabored, BS CTAB CARDIAC: RRR, no murmur,no extra heart sounds, no edema GI: Abdomen soft, normal BS, no masses, no tenderness, no hepatomegaly, no splenomegaly MUSCULOSKELETAL: No deformities. Movement at each extremity is full and painless. Strength is 5/5 at each extremity. Back is without kyphosis or scoliosis CIRCULATION: Pedal pulses  are 2+. There is no edema of the legs, ankles and feet NEUROLOGICAL: There is no tremor. Speech is clear PSYCHIATRIC: Alert and oriented X 3. Affect and behavior are appropriate  Labs reviewed: Recent Labs    10/24/19 0000 04/03/20 0000  NA 138 141  141  K 4.5 4.3  4.3  CL 102 105  105  CO2 25* 25*  25*  BUN 17 15  15   CREATININE 0.6 0.5  0.5  CALCIUM 9.0 9.1  9.1   No results for input(s): AST, ALT, ALKPHOS, BILITOT, PROT, ALBUMIN in the last 8760 hours. Recent Labs    11/09/19 0000 04/03/20 0000 05/29/20 0000  WBC 4.5 4.1  4.1 4.9  NEUTROABS 3 2   2 3   HGB 11.0* 11.3*  11.3* 11.8*  HCT 32* 34*  34* 35*  PLT 86* 110*  110* 114*   Lab Results  Component Value Date   TSH 2.11 11/09/2019   Lab Results  Component Value Date   HGBA1C 5.9 11/09/2019   No results found for: CHOL, HDL, LDLCALC, LDLDIRECT, TRIG, CHOLHDL  Significant Diagnostic Results in last 30 days:  No results found.  Assessment/Plan    Family/ staff Communication:   Labs/tests ordered:    Goals of care:      11/11/2019, DNP, MSN, FNP-BC [facility] [phone] (Monday-Friday 8:00 a.m. - 5:00 p.m.) [phone] (after hours)

## 2020-10-03 ENCOUNTER — Non-Acute Institutional Stay (SKILLED_NURSING_FACILITY): Payer: Medicare Other | Admitting: Adult Health

## 2020-10-03 ENCOUNTER — Encounter: Payer: Self-pay | Admitting: Adult Health

## 2020-10-03 DIAGNOSIS — Z7189 Other specified counseling: Secondary | ICD-10-CM | POA: Diagnosis not present

## 2020-10-03 DIAGNOSIS — F339 Major depressive disorder, recurrent, unspecified: Secondary | ICD-10-CM

## 2020-10-03 DIAGNOSIS — F0280 Dementia in other diseases classified elsewhere without behavioral disturbance: Secondary | ICD-10-CM

## 2020-10-03 DIAGNOSIS — E034 Atrophy of thyroid (acquired): Secondary | ICD-10-CM

## 2020-10-03 DIAGNOSIS — R630 Anorexia: Secondary | ICD-10-CM | POA: Diagnosis not present

## 2020-10-03 DIAGNOSIS — G309 Alzheimer's disease, unspecified: Secondary | ICD-10-CM

## 2020-10-03 NOTE — Progress Notes (Signed)
Location:  [facility] Room Number: 102 B Place of Service:  SNF (31) Provider:  Kenard Gower, DNP, FNP-BC  Patient Care Team: Pecola Lawless, MD as PCP - General (Internal Medicine) Medina-Vargas, Margit Banda, NP as Nurse Practitioner (Internal Medicine)  Extended Emergency Contact Information Primary Emergency Contact: [name],[name] Address: [address]          [place], [place] [address] Darden [place] of [place] Home Phone: [phone] Mobile Phone: [phone] Relation: Daughter  Code Status:  DNR   Goals of care: Advanced Directive information Advanced Directives 08/31/2020  Does Patient Have a Medical Advance Directive? Yes  Type of Advance Directive Out of facility DNR (pink MOST or yellow form)  Does patient want to make changes to medical advance directive? No - Patient declined  Copy of Healthcare Power of Attorney in Chart? -  Would patient like information on creating a medical advance directive? -  Pre-existing out of facility DNR order (yellow form or pink MOST form) Yellow form placed in chart (order not valid for inpatient use)     Chief Complaint  Patient presents with  . Advance Care Planning    Care Plan Meeting    HPI:  Pt is an 87 y.o. female seen today for a care plan meeting. She is a long-term care resident of [facility]. She has a PMH of Alzheimer's disease, CVA, depression, hypertension, diabetes mellitus and hypothyroidism. The meeting was attended by Child psychotherapist and NP. Daughter, [name], was called via teleconference but did not answer. Resident declined to attend the meeting. Resident remains to be DNR. Discussed medications, vital signs and weights. She had her COVID-19 vaccine booster on 08/28/20. No untoward side effects reported. Resident was stable for the past 3 months. The meeting lasted for 20 minutes.   Past Medical History:  Diagnosis Date  . Cerebrovascular disease   .  Dementia (HCC)   . Depression   . DM type 2 (diabetes mellitus, type 2) (HCC) 07/02/2011  . Elevated LFTs   . Frequent falls   . HTN (hypertension) 07/02/2011  . Thrombocytopenia (HCC)   . UTI (lower urinary tract infection)    Past Surgical History:  Procedure Laterality Date  . ABDOMINAL HYSTERECTOMY    . CHOLECYSTECTOMY      No Known Allergies  Outpatient Encounter Medications as of 10/03/2020  Medication Sig  . acetaminophen (TYLENOL) 325 MG tablet Take 650 mg by mouth every 6 (six) hours as needed.  Marland Kitchen aspirin EC 81 MG tablet Take 81 mg by mouth 2 (two) times daily.   . bisacodyl (DULCOLAX) 10 MG suppository If not relieved by MOM, give 10 mg Bisacodyl suppositiory rectally X 1 dose in 24 hours as needed (Do not use constipation standing orders for residents with renal failure/CFR less than 30. Contact MD for orders) (Physician Order)  . levothyroxine (SYNTHROID) 50 MCG tablet Take 50 mcg by mouth daily before breakfast.  . magnesium hydroxide (MILK OF MAGNESIA) 400 MG/5ML suspension If no BM in 3 days, give 30 cc Milk of Magnesium p.o. x 1 dose in 24 hours as needed (Do not use standing constipation orders for residents with renal failure CFR less than 30. Contact MD for orders) (Physician Order)  . mirtazapine (REMERON) 15 MG tablet Take 15 mg by mouth at bedtime.  . Multiple Vitamin (MULTIVITAMIN WITH MINERALS) TABS tablet Take 1 tablet by mouth 2 (two) times daily.  . NON FORMULARY Mech soft diet  . NON FORMULARY  Take 1 each by mouth in the morning and at bedtime. Magic Cup  . Nutritional Supplement LIQD Take 120 mLs by mouth daily. MedPass  . nystatin (MYCOSTATIN/NYSTOP) powder Apply 1 application topically in the morning, at noon, in the evening, and at bedtime.  . sertraline (ZOLOFT) 25 MG tablet Take 25 mg by mouth daily.  . Sodium Phosphates (RA SALINE ENEMA RE) If not relieved by Biscodyl suppository, give disposable Saline Enema rectally X 1 dose/24 hrs as needed (Do not  use constipation standing orders for residents with renal failure/CFR less than 30. Contact MD for orders)(Physician Or   No facility-administered encounter medications on file as of 10/03/2020.    Review of Systems  GENERAL: No change in appetite, no fatigue, no weight changes, no fever, chills or weakness MOUTH and THROAT: Denies oral discomfort, gingival pain or bleeding RESPIRATORY: no cough, SOB, DOE, wheezing, hemoptysis CARDIAC: No chest pain, edema or palpitations GI: No abdominal pain, diarrhea, constipation, heart burn, nausea or vomiting GU: Denies dysuria, frequency, hematuria or discharge NEUROLOGICAL: Denies dizziness, syncope, numbness, or headache PSYCHIATRIC: Denies feelings of depression or anxiety. No report of hallucinations, insomnia, paranoia, or agitation   Immunization History  Administered Date(s) Administered  . Influenza, High Dose Seasonal PF 06/29/2019  . Influenza-Unspecified 07/12/2020  . Moderna Sars-Covid-2 Vaccination 10/10/2019, 11/07/2019, 08/28/2020  . Pneumococcal Polysaccharide-23 07/03/2011  . Tdap 10/22/2012   Pertinent  Health Maintenance Due  Topic Date Due  . URINE MICROALBUMIN  Never done  . PNA vac Low Risk Adult (2 of 2 - PCV13) 07/02/2012  . INFLUENZA VACCINE  Completed  . FOOT EXAM  Discontinued  . HEMOGLOBIN A1C  Discontinued  . OPHTHALMOLOGY EXAM  Discontinued  . DEXA SCAN  Discontinued   Fall Risk  04/20/2019  Falls in the past year? (No Data)  Comment Emmi Telephone Survey: data to providers prior to load  Number falls in past yr: (No Data)  Comment Emmi Telephone Survey Actual Response =      Vitals:   10/03/20 1000  BP: 136/68  Pulse: 66  Resp: 17  Temp: (!) 97.3 F (36.3 C)  Weight: 128 lb 3.2 oz (58.2 kg)  Height: 5\' 3"  (1.6 m)   Body mass index is 22.71 kg/m.  Physical Exam  GENERAL APPEARANCE: Well nourished. In no acute distress. Normal body habitus SKIN:  Skin is warm and dry.  MOUTH and THROAT:  Lips are without lesions. Oral mucosa is moist and without lesions.  RESPIRATORY: Breathing is even & unlabored, BS CTAB CARDIAC: RRR, no murmur,no extra heart sounds, no edema GI: Abdomen soft, normal BS, no masses, no tenderness EXTREMITIES:  Able to move X 4 extremities NEUROLOGICAL: There is no tremor. Speech is clear. Alert to self, disoriented to time and place. PSYCHIATRIC:  Affect and behavior are appropriate  Labs reviewed: Recent Labs    10/24/19 0000 04/03/20 0000  NA 138 141  141  K 4.5 4.3  4.3  CL 102 105  105  CO2 25* 25*  25*  BUN 17 15  15   CREATININE 0.6 0.5  0.5  CALCIUM 9.0 9.1  9.1    Recent Labs    11/09/19 0000 04/03/20 0000 05/29/20 0000  WBC 4.5 4.1  4.1 4.9  NEUTROABS 3 2  2 3   HGB 11.0* 11.3*  11.3* 11.8*  HCT 32* 34*  34* 35*  PLT 86* 110*  110* 114*   Lab Results  Component Value Date   TSH 2.11 11/09/2019  Lab Results  Component Value Date   HGBA1C 5.9 11/09/2019    Assessment/Plan  1. Advance care planning -  Remains to be DNR -  discussed medications, vital signs and weights  2. Hypothyroidism due to acquired atrophy of thyroid Lab Results  Component Value Date   TSH 2.11 11/09/2019   -   Continue levothyroxine  3. Poor appetite Wt Readings from Last 3 Encounters:  10/03/20 128 lb 3.2 oz (58.2 kg)  09/24/20 132 lb 12.8 oz (60.2 kg)  08/31/20 132 lb 12.8 oz (60.2 kg)   -  Stable, continue Mirtazapine  4. Depression, recurrent (HCC) -  Mood is stable, continue Sertraline -  Followed by psych NP  5. Alzheimer's dementia without behavioral disturbance, unspecified timing of dementia onset (HCC) -  Continue supportive care     Family/ staff Communication:  Discussed plan of care with IDT.  Labs/tests ordered:  None  Goals of care:   Long-term care   Kenard Gower, DNP, MSN, FNP-BC [facility] [phone] (Monday-Friday 8:00 a.m. - 5:00 p.m.) [phone]  (after hours)

## 2020-10-04 ENCOUNTER — Encounter: Payer: Self-pay | Admitting: Adult Health

## 2020-10-24 ENCOUNTER — Non-Acute Institutional Stay (SKILLED_NURSING_FACILITY): Payer: Medicare Other | Admitting: Adult Health

## 2020-10-24 DIAGNOSIS — R630 Anorexia: Secondary | ICD-10-CM | POA: Diagnosis not present

## 2020-10-24 DIAGNOSIS — G309 Alzheimer's disease, unspecified: Secondary | ICD-10-CM

## 2020-10-24 DIAGNOSIS — D696 Thrombocytopenia, unspecified: Secondary | ICD-10-CM | POA: Diagnosis not present

## 2020-10-24 DIAGNOSIS — F339 Major depressive disorder, recurrent, unspecified: Secondary | ICD-10-CM

## 2020-10-24 DIAGNOSIS — E034 Atrophy of thyroid (acquired): Secondary | ICD-10-CM | POA: Diagnosis not present

## 2020-10-24 DIAGNOSIS — F0280 Dementia in other diseases classified elsewhere without behavioral disturbance: Secondary | ICD-10-CM

## 2020-10-24 NOTE — Progress Notes (Signed)
Location:  [facility] Room Number: 102-B Place of Service:  SNF (31) Provider:  Kenard Gower, DNP, FNP-BC  Patient Care Team: Pecola Lawless, MD as PCP - General (Internal Medicine) Medina-Vargas, Margit Banda, NP as Nurse Practitioner (Internal Medicine)  Extended Emergency Contact Information Primary Emergency Contact: [name],[name] Address: [address]          [place], [place] [address] D[place] of [place] Home Phone: [phone] Mobile Phone: [phone] Relation: Daughter  Code Status:  DNR  Goals of care: Advanced Directive information Advanced Directives 10/04/2020  Does Patient Have a Medical Advance Directive? Yes  Type of Advance Directive Out of facility DNR (pink MOST or yellow form)  Does patient want to make changes to medical advance directive? No - Patient declined  Copy of Healthcare Power of Attorney in Chart? -  Would patient like information on creating a medical advance directive? -  Pre-existing out of facility DNR order (yellow form or pink MOST form) Yellow form placed in chart (order not valid for inpatient use)     Chief Complaint  Patient presents with  . Medical Management of Chronic Issues    Routine [facility] visit    HPI:  Pt is an 87 y.o. female seen today for medical management of chronic diseases.  She is a long-term care resident of  [facility]. She has a PMH of Alzheimer's disease, CVA, depression, hypertension, diabetes mellitus and hypothyroidism. Latest platelet 114, 05/29/20. No bruising nor bleeding noted.  Latest weight 128.2 lbs, down 3.43% in 32 days.  Daughter does not wish for a feeding tube. Oral intake is 25 to 50%.  She takes Remeron 15 mg at bedtime to increase appetite.   Past Medical History:  Diagnosis Date  . Cerebrovascular disease   . Dementia (HCC)   . Depression   . DM type 2 (diabetes mellitus, type 2) (HCC) 07/02/2011  . Elevated LFTs   . Frequent  falls   . HTN (hypertension) 07/02/2011  . Thrombocytopenia (HCC)   . UTI (lower urinary tract infection)    Past Surgical History:  Procedure Laterality Date  . ABDOMINAL HYSTERECTOMY    . CHOLECYSTECTOMY      No Known Allergies  Outpatient Encounter Medications as of 10/24/2020  Medication Sig  . acetaminophen (TYLENOL) 325 MG tablet Take 650 mg by mouth every 6 (six) hours as needed.  Marland Kitchen aspirin EC 81 MG tablet Take 81 mg by mouth 2 (two) times daily.   . bisacodyl (DULCOLAX) 10 MG suppository If not relieved by MOM, give 10 mg Bisacodyl suppositiory rectally X 1 dose in 24 hours as needed (Do not use constipation standing orders for residents with renal failure/CFR less than 30. Contact MD for orders) (Physician Order)  . levothyroxine (SYNTHROID) 50 MCG tablet Take 50 mcg by mouth daily before breakfast.  . magnesium hydroxide (MILK OF MAGNESIA) 400 MG/5ML suspension If no BM in 3 days, give 30 cc Milk of Magnesium p.o. x 1 dose in 24 hours as needed (Do not use standing constipation orders for residents with renal failure CFR less than 30. Contact MD for orders) (Physician Order)  . mirtazapine (REMERON) 15 MG tablet Take 15 mg by mouth at bedtime.  . Multiple Vitamin (MULTIVITAMIN WITH MINERALS) TABS tablet Take 1 tablet by mouth 2 (two) times daily.  . NON FORMULARY Mech soft diet  . NON FORMULARY Take 1 each by mouth in the morning and at bedtime. Magic Cup  .  Nutritional Supplement LIQD Take 120 mLs by mouth daily. MedPass  . nystatin (MYCOSTATIN/NYSTOP) powder Apply 1 application topically in the morning, at noon, in the evening, and at bedtime.  . sertraline (ZOLOFT) 25 MG tablet Take 25 mg by mouth daily.  . Sodium Phosphates (RA SALINE ENEMA RE) If not relieved by Biscodyl suppository, give disposable Saline Enema rectally X 1 dose/24 hrs as needed (Do not use constipation standing orders for residents with renal failure/CFR less than 30. Contact MD for orders)(Physician Or    No facility-administered encounter medications on file as of 10/24/2020.    Review of Systems  GENERAL: No fever or chills  MOUTH and THROAT: Denies oral discomfort, gingival pain or bleeding RESPIRATORY: no cough, SOB, DOE, wheezing, hemoptysis CARDIAC: No chest pain, edema or palpitations GI: No abdominal pain, diarrhea, constipation, heart burn, nausea or vomiting GU: Denies dysuria, frequency, hematuria, incontinence, or discharge NEUROLOGICAL: Denies dizziness, syncope, numbness, or headache PSYCHIATRIC: Denies feelings of depression or anxiety. No report of hallucinations, insomnia, paranoia, or agitation   Immunization History  Administered Date(s) Administered  . Influenza, High Dose Seasonal PF 06/29/2019  . Influenza-Unspecified 07/12/2020  . Moderna Sars-Covid-2 Vaccination 10/10/2019, 11/07/2019, 08/28/2020  . Pneumococcal Polysaccharide-23 07/03/2011  . Tdap 10/22/2012   Pertinent  Health Maintenance Due  Topic Date Due  . URINE MICROALBUMIN  Never done  . PNA vac Low Risk Adult (2 of 2 - PCV13) 07/02/2012  . INFLUENZA VACCINE  Completed  . FOOT EXAM  Discontinued  . HEMOGLOBIN A1C  Discontinued  . OPHTHALMOLOGY EXAM  Discontinued  . DEXA SCAN  Discontinued   Fall Risk  04/20/2019  Falls in the past year? (No Data)  Comment Emmi Telephone Survey: data to providers prior to load  Number falls in past yr: (No Data)  Comment Emmi Telephone Survey Actual Response =      Vitals:   10/24/20 1615  BP: 133/76  Pulse: 65  Temp: (!) 97 F (36.1 C)  TempSrc: Oral  Weight: 128 lb 3.2 oz (58.2 kg)  Height: 5\' 3"  (1.6 m)   Body mass index is 22.71 kg/m.  Physical Exam  GENERAL APPEARANCE: Well nourished. In no acute distress. Normal body habitus SKIN:  Skin is warm and dry. There are no suspicious lesions or rash MOUTH and THROAT: Lips are without lesions. Oral mucosa is moist and without lesions.  RESPIRATORY: Breathing is even & unlabored, BS  CTAB CARDIAC: RRR, no murmur,no extra heart sounds, no edema GI: Abdomen soft, normal BS, no masses, no tenderness EXTREMITIES: Able to move x4 extremities NEUROLOGICAL: There is no tremor. Speech is clear. Alert to self, disoriented to time and place. PSYCHIATRIC: . Affect and behavior are appropriate  Labs reviewed: Recent Labs    04/03/20 0000  NA 141  141  K 4.3  4.3  CL 105  105  CO2 25*  25*  BUN 15  15  CREATININE 0.5  0.5  CALCIUM 9.1  9.1    Recent Labs    11/09/19 0000 04/03/20 0000 05/29/20 0000  WBC 4.5 4.1  4.1 4.9  NEUTROABS 3 2  2 3   HGB 11.0* 11.3*  11.3* 11.8*  HCT 32* 34*  34* 35*  PLT 86* 110*  110* 114*   Lab Results  Component Value Date   TSH 2.11 11/09/2019   Lab Results  Component Value Date   HGBA1C 5.9 11/09/2019    Assessment/Plan  1. Thrombocytopenia (HCC) Lab Results  Component Value Date  PLT 114 (A) 05/29/2020   11/09/19 -  Platelet 86  -  improved  2. Hypothyroidism due to acquired atrophy of thyroid Lab Results  Component Value Date   TSH 2.11 11/09/2019   -   Continue levothyroxine  3. Poor appetite Wt Readings from Last 3 Encounters:  10/24/20 128 lb 3.2 oz (58.2 kg)  10/03/20 128 lb 3.2 oz (58.2 kg)  09/24/20 132 lb 12.8 oz (60.2 kg)   -   Continue Remeron to increase appetite   4. Depression, recurrent (HCC) -   Mood is stable, continue sertraline  5. Alzheimer's dementia without behavioral disturbance, unspecified timing of dementia onset (HCC) -Continue supportive care    Family/ staff Communication: Discussed plan of care with resident and charge nurse.  Labs/tests ordered: None  Goals of care:   Long-term care  Kenard Gower, DNP, MSN, FNP-BC [facility] [phone] (Monday-Friday 8:00 a.m. - 5:00 p.m.) [phone] (after hours)

## 2020-11-20 ENCOUNTER — Encounter: Payer: Self-pay | Admitting: Internal Medicine

## 2020-11-20 ENCOUNTER — Non-Acute Institutional Stay (SKILLED_NURSING_FACILITY): Payer: Medicare Other | Admitting: Adult Health

## 2020-11-20 ENCOUNTER — Encounter: Payer: Self-pay | Admitting: Adult Health

## 2020-11-20 DIAGNOSIS — F0280 Dementia in other diseases classified elsewhere without behavioral disturbance: Secondary | ICD-10-CM

## 2020-11-20 DIAGNOSIS — R630 Anorexia: Secondary | ICD-10-CM

## 2020-11-20 DIAGNOSIS — F339 Major depressive disorder, recurrent, unspecified: Secondary | ICD-10-CM | POA: Diagnosis not present

## 2020-11-20 DIAGNOSIS — G309 Alzheimer's disease, unspecified: Secondary | ICD-10-CM

## 2020-11-20 DIAGNOSIS — E034 Atrophy of thyroid (acquired): Secondary | ICD-10-CM | POA: Diagnosis not present

## 2020-11-20 DIAGNOSIS — I10 Essential (primary) hypertension: Secondary | ICD-10-CM | POA: Diagnosis not present

## 2020-11-20 NOTE — Progress Notes (Signed)
Location:  [facility] Room Number: 102/B Place of Service:  SNF (31) Provider:  Kenard Gower, DNP, FNP-BC  Patient Care Team: Pecola Lawless, MD as PCP - General (Internal Medicine) Medina-Vargas, Margit Banda, NP as Nurse Practitioner (Internal Medicine)  Extended Emergency Contact Information Primary Emergency Contact: [name],[name] Address: [address]          [place], [place] [address] D[place] of [place] Home Phone: [phone] Mobile Phone: [phone] Relation: Daughter  Code Status: DNR   Goals of care: Advanced Directive information Advanced Directives 11/20/2020  Does Patient Have a Medical Advance Directive? Yes  Type of Advance Directive Out of facility DNR (pink MOST or yellow form)  Does patient want to make changes to medical advance directive? No - Patient declined  Copy of Healthcare Power of Attorney in Chart? -  Would patient like information on creating a medical advance directive? -  Pre-existing out of facility DNR order (yellow form or pink MOST form) Yellow form placed in chart (order not valid for inpatient use)     Chief Complaint  Patient presents with  . Medical Management of Chronic Issues    Routine Visit of Medical Management     HPI:  Pt is a 87 y.o. female seen today for medical management of chronic diseases. She is a long-term care resident of [facility]. She has a PMH of Alzheimer's disease, CVA, depression, hypertension, diabetes mellitus and hypothyroidism. SBPs ranging from 118 to 133, with outlier 186 and 190. She does not take any anti-hypertensive medications.  Latest weight is 134.6 lbs. She takes Mirtazapine 15 mg at bedtime to increase appetite.   Past Medical History:  Diagnosis Date  . Cerebrovascular disease   . Dementia (HCC)   . Depression   . DM type 2 (diabetes mellitus, type 2) (HCC) 07/02/2011  . Elevated LFTs   . Frequent falls   . HTN (hypertension)  07/02/2011  . Thrombocytopenia (HCC)   . UTI (lower urinary tract infection)    Past Surgical History:  Procedure Laterality Date  . ABDOMINAL HYSTERECTOMY    . CHOLECYSTECTOMY      No Known Allergies  Outpatient Encounter Medications as of 11/20/2020  Medication Sig  . acetaminophen (TYLENOL) 325 MG tablet Take 650 mg by mouth every 6 (six) hours as needed.  Marland Kitchen aspirin EC 81 MG tablet Take 81 mg by mouth 2 (two) times daily.   . bisacodyl (DULCOLAX) 10 MG suppository If not relieved by MOM, give 10 mg Bisacodyl suppositiory rectally X 1 dose in 24 hours as needed (Do not use constipation standing orders for residents with renal failure/CFR less than 30. Contact MD for orders) (Physician Order)  . levothyroxine (SYNTHROID) 50 MCG tablet Take 50 mcg by mouth daily before breakfast.  . magnesium hydroxide (MILK OF MAGNESIA) 400 MG/5ML suspension If no BM in 3 days, give 30 cc Milk of Magnesium p.o. x 1 dose in 24 hours as needed (Do not use standing constipation orders for residents with renal failure CFR less than 30. Contact MD for orders) (Physician Order)  . mirtazapine (REMERON) 15 MG tablet Take 15 mg by mouth at bedtime.  . Multiple Vitamin (MULTIVITAMIN WITH MINERALS) TABS tablet Take 1 tablet by mouth 2 (two) times daily.  . NON FORMULARY Mech soft diet  . NON FORMULARY Take 1 each by mouth in the morning and at bedtime. Magic Cup  . Nutritional Supplement LIQD Take by mouth. MED PASS 120 ML QD  BID  . nystatin (MYCOSTATIN/NYSTOP) powder Apply 1 application topically in the morning, at noon, in the evening, and at bedtime. APPLY TOPICALLY TO BILATERAL ABDOMINAL FOLDS  (DX CANDIDA, SKIN0  . sertraline (ZOLOFT) 25 MG tablet Take 25 mg by mouth daily.  . Sodium Phosphates (RA SALINE ENEMA RE) If not relieved by Biscodyl suppository, give disposable Saline Enema rectally X 1 dose/24 hrs as needed (Do not use constipation standing orders for residents with renal failure/CFR less than 30.  Contact MD for orders)(Physician Or   No facility-administered encounter medications on file as of 11/20/2020.    Review of Systems  GENERAL: No change in appetite, no fatigue, no weight changes, no fever, chills or weakness MOUTH and THROAT: Denies oral discomfort, gingival pain or bleeding RESPIRATORY: no cough, SOB, DOE, wheezing, hemoptysis CARDIAC: No chest pain, edema or palpitations GI: No abdominal pain, diarrhea, constipation, heart burn, nausea or vomiting GU: Denies dysuria, frequency, hematuria or discharge NEUROLOGICAL: Denies dizziness, syncope, numbness, or headache PSYCHIATRIC: Denies feelings of depression or anxiety. No report of hallucinations, insomnia, paranoia, or agitation    Immunization History  Administered Date(s) Administered  . Influenza, High Dose Seasonal PF 06/29/2019  . Influenza-Unspecified 07/12/2020  . Moderna Sars-Covid-2 Vaccination 10/10/2019, 11/07/2019, 08/28/2020  . Pneumococcal Polysaccharide-23 07/03/2011  . Tdap 10/22/2012   Pertinent  Health Maintenance Due  Topic Date Due  . URINE MICROALBUMIN  Never done  . PNA vac Low Risk Adult (2 of 2 - PCV13) 07/02/2012  . INFLUENZA VACCINE  Completed  . FOOT EXAM  Discontinued  . HEMOGLOBIN A1C  Discontinued  . OPHTHALMOLOGY EXAM  Discontinued  . DEXA SCAN  Discontinued   Fall Risk  04/20/2019  Falls in the past year? (No Data)  Comment Emmi Telephone Survey: data to providers prior to load  Number falls in past yr: (No Data)  Comment Emmi Telephone Survey Actual Response =      Vitals:   11/20/20 1527  BP: (!) 131/54  Pulse: 67  Resp: 17  Temp: 98.1 F (36.7 C)  SpO2: 97%  Weight: 134 lb 9.6 oz (61.1 kg)  Height: 5\' 3"  (1.6 m)   Body mass index is 23.84 kg/m.  Physical Exam  GENERAL APPEARANCE: Well nourished. In no acute distress. Normal body habitus SKIN:  Skin is warm and dry.  MOUTH and THROAT: Lips are without lesions. Oral mucosa is moist and without lesions.   RESPIRATORY: Breathing is even & unlabored, BS CTAB CARDIAC: RRR, no murmur,no extra heart sounds, no edema GI: Abdomen soft, normal BS, no masses, no tendernes NEUROLOGICAL: There is no tremor. Speech is clear. Alert to self, disoriented to time and place. PSYCHIATRIC:  Affect and behavior are appropriate  Labs reviewed: Recent Labs    04/03/20 0000  NA 141  141  K 4.3  4.3  CL 105  105  CO2 25*  25*  BUN 15  15  CREATININE 0.5  0.5  CALCIUM 9.1  9.1    Recent Labs    04/03/20 0000 05/29/20 0000  WBC 4.1  4.1 4.9  NEUTROABS 2  2 3   HGB 11.3*  11.3* 11.8*  HCT 34*  34* 35*  PLT 110*  110* 114*   Lab Results  Component Value Date   TSH 2.11 11/09/2019   Lab Results  Component Value Date   HGBA1C 5.9 11/09/2019    Assessment/Plan  1. Essential hypertension -  Monitor BPs -   Not on any antihypertensive medications  2.  Hypothyroidism due to acquired atrophy of thyroid Lab Results  Component Value Date   TSH 2.11 11/09/2019   -Continue levothyroxine  3. Poor appetite Wt Readings from Last 3 Encounters:  11/20/20 134 lb 9.6 oz (61.1 kg)  11/20/20 134 lb 9.6 oz (61.1 kg)  10/24/20 128 lb 3.2 oz (58.2 kg)   -   Continue Mirtazapine, multivitamins and med Pass  4. Major depression, recurrent, chronic (HCC) -   Mood is stable, continue sertraline -   Followed by psych NP  5. Alzheimer's dementia without behavioral disturbance, unspecified timing of dementia onset (HCC) -  BIMS score 6/15, ranging in severe cognitive impairment -  continue supportive care     Family/ staff Communication: Discussed plan of care with resident and charge nurse.  Labs/tests ordered:  Tsh, CMP, CBC hgbA1C  Goals of care:   Long-term care   Kenard Gower, DNP, MSN, FNP-BC [facility] [phone] (Monday-Friday 8:00 a.m. - 5:00 p.m.) [phone] (after hours)

## 2020-11-20 NOTE — Progress Notes (Signed)
   NURSING HOME LOCATION:  [facility] ROOM NUMBER:  102/B  CODE STATUS: DNR   PCP:  Pecola Lawless, MD   This is a comprehensive admission note to [facility] performed on this date less than 30 days from date of admission. Included are preadmission medical/surgical history; reconciled medication list; family history; social history and comprehensive review of systems.  Corrections and additions to the records were documented. Comprehensive physical exam was also performed. Additionally a clinical summary was entered for each active diagnosis pertinent to this admission in the Problem List to enhance continuity of care.  HPI:  Past medical and surgical history:  Social history:  Family history:   Review of systems:  Could not be completed due to dementia. Date given as Constitutional: No fever, significant weight change, fatigue  Eyes: No redness, discharge, pain, vision change ENT/mouth: No nasal congestion, purulent discharge, earache, change in hearing, sore throat  Cardiovascular: No chest pain, palpitations, paroxysmal nocturnal dyspnea, claudication, edema  Respiratory: No cough, sputum production, hemoptysis, DOE, significant snoring, apnea Gastrointestinal: No heartburn, dysphagia, abdominal pain, nausea /vomiting, rectal bleeding, melena, change in bowels Genitourinary: No dysuria, hematuria, pyuria, incontinence, nocturia Musculoskeletal: No joint stiffness, joint swelling, weakness, pain Dermatologic: No rash, pruritus, change in appearance of skin Neurologic: No dizziness, headache, syncope, seizures, numbness, tingling Psychiatric: No significant anxiety, depression, insomnia, anorexia Endocrine: No change in hair/skin/nails, excessive thirst, excessive hunger, excessive urination  Hematologic/lymphatic: No significant bruising, lymphadenopathy, abnormal bleeding Allergy/immunology: No itchy/watery eyes, significant sneezing, urticaria,  angioedema  Physical exam:  Pertinent or positive findings: General appearance: Adequately nourished; no acute distress, increased work of breathing is present.   Lymphatic: No lymphadenopathy about the head, neck, axilla. Eyes: No conjunctival inflammation or lid edema is present. There is no scleral icterus. Ears:  External ear exam shows no significant lesions or deformities.   Nose:  External nasal examination shows no deformity or inflammation. Nasal mucosa are pink and moist without lesions, exudates Oral exam: Lips and gums are healthy appearing.There is no oropharyngeal erythema or exudate. Neck:  No thyromegaly, masses, tenderness noted.    Heart:  Normal rate and regular rhythm. S1 and S2 normal without gallop, murmur, click, rub.  Lungs: Chest clear to auscultation without wheezes, rhonchi, rales, rubs. Abdomen: Bowel sounds are normal.  Abdomen is soft and nontender with no organomegaly, hernias, masses. GU: Deferred  Extremities:  No cyanosis, clubbing, edema. Neurologic exam:  Strength equal  in upper & lower extremities. Balance, Rhomberg, finger to nose testing could not be completed due to clinical state Deep tendon reflexes are equal Skin: Warm & dry w/o tenting. No significant lesions or rash.  See clinical summary under each active problem in the Problem List with associated updated therapeutic plan  This encounter was created in error - please disregard.

## 2020-12-11 ENCOUNTER — Encounter: Payer: Self-pay | Admitting: Internal Medicine

## 2020-12-11 ENCOUNTER — Non-Acute Institutional Stay (SKILLED_NURSING_FACILITY): Payer: Medicare Other | Admitting: Internal Medicine

## 2020-12-11 DIAGNOSIS — E1151 Type 2 diabetes mellitus with diabetic peripheral angiopathy without gangrene: Secondary | ICD-10-CM | POA: Diagnosis not present

## 2020-12-11 DIAGNOSIS — E232 Diabetes insipidus: Secondary | ICD-10-CM | POA: Insufficient documentation

## 2020-12-11 DIAGNOSIS — I5189 Other ill-defined heart diseases: Secondary | ICD-10-CM

## 2020-12-11 DIAGNOSIS — D696 Thrombocytopenia, unspecified: Secondary | ICD-10-CM | POA: Diagnosis not present

## 2020-12-11 DIAGNOSIS — F0280 Dementia in other diseases classified elsewhere without behavioral disturbance: Secondary | ICD-10-CM

## 2020-12-11 DIAGNOSIS — G309 Alzheimer's disease, unspecified: Secondary | ICD-10-CM

## 2020-12-11 DIAGNOSIS — E034 Atrophy of thyroid (acquired): Secondary | ICD-10-CM

## 2020-12-11 NOTE — Assessment & Plan Note (Signed)
Last A1c was in February 2021 and was nondiabetic at 5.9%.  Update will be discussed with the [facility] NP.

## 2020-12-11 NOTE — Assessment & Plan Note (Signed)
Last TSH was therapeutic but it was in February 2021.  Update will be discussed with the [facility] NP.

## 2020-12-11 NOTE — Assessment & Plan Note (Signed)
Diabetes insipidus has clinically resolved.

## 2020-12-11 NOTE — Assessment & Plan Note (Signed)
Most recent platelet count was 114,000.  No bleeding dyscrasias reported.  Platelet count will be rechecked should such arise.

## 2020-12-11 NOTE — Assessment & Plan Note (Signed)
Clinically compensated with no abnormal breath sounds, neck vein distention, hepatojugular reflux, or peripheral edema.

## 2020-12-11 NOTE — Assessment & Plan Note (Signed)
She is pleasantly demented unable to provide the date or the name of the president.  No behavioral issues reported.

## 2020-12-11 NOTE — Patient Instructions (Signed)
See assessment and plan under each diagnosis in the problem list and acutely for this visit 

## 2020-12-11 NOTE — Progress Notes (Unsigned)
NURSING HOME LOCATION:  [facility] ROOM NUMBER:  102 B CODE STATUS:  DNR PCP:  Douglass Rivers MD  This is a nursing facility follow up visit of chronic medical diagnoses & to document compliance with Regulation 483.30 (c) in The Long Term Care Survey Manual Phase 2 which mandates caregiver visit ( visits can alternate among physician, PA or NP as per statutes) within 10 days of 30 days / 60 days/ 90 days post admission to SNF date    Interim medical record and care since last SNF visit was updated with review of diagnostic studies and change in clinical status since last visit were documented.  HPI: She is a permanent resident of facility with medical diagnoses of essential hypertension, diabetes with peripheral vascular disease, history of IBS, hypothyroidism, history of diabetes insipidus, history of diastolic dysfunction, history of thrombocytopenia, dementia and history of COVID-19 infection in January 2021.  Labs are not current; she has had a stable anemia with the most recent H/H being 11.8/35.  Platelet count was 114,000, stable to slightly improved.  No bleeding dyscrasias have been reported.  Review of systems:  She was unable to provide the date, not even the year, nor the President's name.  She cradles a stuffed bunny across her chest.  When I asked its name the response was "Bunny".  She could not tell me old Mr. Hassie Bruce was. Dementia invalidated responses, which were all totally negative.  She made the comment when asked how she was doing "okay, I guess".  Staff validated that she does have incomplete bowel movements with some clinical constipation.   Constitutional: No fever, significant weight change, fatigue  Eyes: No redness, discharge, pain, vision change ENT/mouth: No nasal congestion,  purulent discharge, earache, change in hearing, sore throat  Cardiovascular: No chest pain, palpitations, paroxysmal nocturnal dyspnea, claudication, edema  Respiratory:  No cough, sputum production, hemoptysis, DOE, significant snoring, apnea   Gastrointestinal: No heartburn, dysphagia, abdominal pain, nausea /vomiting, rectal bleeding, melena Genitourinary: No dysuria, hematuria, pyuria, incontinence, nocturia Musculoskeletal: No joint stiffness, joint swelling, weakness, pain Dermatologic: No rash, pruritus, change in appearance of skin Neurologic: No dizziness, headache, syncope, seizures, numbness, tingling Psychiatric: No significant anxiety, depression, insomnia, anorexia Endocrine: No change in hair/skin/nails, excessive thirst, excessive hunger, excessive urination  Hematologic/lymphatic: No significant bruising, lymphadenopathy, abnormal bleeding Allergy/immunology: No itchy/watery eyes, significant sneezing, urticaria, angioedema  Physical exam:  Pertinent or positive findings: She appears her stated age.  Hair is disheveled.  She is hard of hearing.  She is edentulous and her mouth is agape.  Posterior tibial pulses are stronger than the dorsalis pedis pulses.  She is symmetrically weak to opposition in all extremities.  General appearance: Adequately nourished; no acute distress, increased work of breathing is present.   Lymphatic: No lymphadenopathy about the head, neck, axilla. Eyes: No conjunctival inflammation or lid edema is present. There is no scleral icterus. Ears:  External ear exam shows no significant lesions or deformities.   Nose:  External nasal examination shows no deformity or inflammation. Nasal mucosa are pink and moist without lesions, exudates Oral exam:  Lips and gums are healthy appearing. There is no oropharyngeal erythema or exudate. Neck:  No thyromegaly, masses, tenderness noted.    Heart:  Normal rate and regular rhythm. S1 and S2 normal without gallop, murmur, click, rub .  Lungs: Chest clear to auscultation without wheezes, rhonchi, rales, rubs. Abdomen: Bowel sounds are normal. Abdomen is soft and nontender with no  organomegaly, hernias, masses. GU: Deferred  Extremities:  No cyanosis, clubbing, edema  Neurologic exam :Balance, Rhomberg, finger to nose testing could not be completed due to clinical state Skin: Warm & dry w/o tenting. No significant lesions or rash.  See summary under each active problem in the Problem List with associated updated therapeutic plan

## 2021-01-02 ENCOUNTER — Encounter: Payer: Self-pay | Admitting: Adult Health

## 2021-01-02 ENCOUNTER — Non-Acute Institutional Stay (SKILLED_NURSING_FACILITY): Payer: Medicare Other | Admitting: Adult Health

## 2021-01-02 DIAGNOSIS — G309 Alzheimer's disease, unspecified: Secondary | ICD-10-CM

## 2021-01-02 DIAGNOSIS — E034 Atrophy of thyroid (acquired): Secondary | ICD-10-CM

## 2021-01-02 DIAGNOSIS — F339 Major depressive disorder, recurrent, unspecified: Secondary | ICD-10-CM

## 2021-01-02 DIAGNOSIS — F0280 Dementia in other diseases classified elsewhere without behavioral disturbance: Secondary | ICD-10-CM

## 2021-01-02 DIAGNOSIS — Z7189 Other specified counseling: Secondary | ICD-10-CM

## 2021-01-02 DIAGNOSIS — R630 Anorexia: Secondary | ICD-10-CM

## 2021-01-02 DIAGNOSIS — E1169 Type 2 diabetes mellitus with other specified complication: Secondary | ICD-10-CM

## 2021-01-02 NOTE — Progress Notes (Signed)
Location:  [facility] Room Number: 102 B Place of Service:  SNF (31) Provider:  Kenard Gower, DNP, FNP-BC  Patient Care Team: Pecola Lawless, MD as PCP - General (Internal Medicine) Medina-Vargas, Margit Banda, NP as Nurse Practitioner (Internal Medicine)  Extended Emergency Contact Information Primary Emergency Contact: [name],[name] Address: [address]          [place], [place] [address] [place] of [place] Home Phone: [phone] Mobile Phone: [phone] Relation: Daughter  Code Status:  DNR  Goals of care: Advanced Directive information Advanced Directives 12/11/2020  Does Patient Have a Medical Advance Directive? Yes  Type of Advance Directive Out of facility DNR (pink MOST or yellow form)  Does patient want to make changes to medical advance directive? No - Patient declined  Copy of Healthcare Power of Attorney in Chart? -  Would patient like information on creating a medical advance directive? -  Pre-existing out of facility DNR order (yellow form or pink MOST form) Yellow form placed in chart (order not valid for inpatient use)     Chief Complaint  Patient presents with  . Acute Visit    Advance care planning    HPI:  Pt is a 87 y.o. female seen today for care plan meeting.  She is a long-term care resident of [facility]. The meeting was attended by Child psychotherapist, life enrichment, MDS coordinator, NP and Lamar Sprinkles, daughter, who attended via teleconference.  She remains to be DNR. Resident likes getting out of bed and watching other residents but does not participate in activities. She has good appetite and had a total of 18.4 lbs. She currently takes Remeron 15 mg at bedtime for poor appetite. Daughter agreed to have Remeron dosage to be decreased to 7.5 mg at bedtime. The meeting lasted for 20 minutes.   Past Medical History:  Diagnosis Date  . Cerebrovascular disease   . Dementia (HCC)   .  Depression   . DM type 2 (diabetes mellitus, type 2) (HCC) 07/02/2011  . Elevated LFTs   . Frequent falls   . HTN (hypertension) 07/02/2011  . Thrombocytopenia (HCC)   . UTI (lower urinary tract infection)    Past Surgical History:  Procedure Laterality Date  . ABDOMINAL HYSTERECTOMY    . CHOLECYSTECTOMY      No Known Allergies  Outpatient Encounter Medications as of 01/02/2021  Medication Sig  . acetaminophen (TYLENOL) 325 MG tablet Take 650 mg by mouth every 6 (six) hours as needed.  Marland Kitchen aspirin EC 81 MG tablet Take 81 mg by mouth daily.  . bisacodyl (DULCOLAX) 10 MG suppository If not relieved by MOM, give 10 mg Bisacodyl suppositiory rectally X 1 dose in 24 hours as needed (Do not use constipation standing orders for residents with renal failure/CFR less than 30. Contact MD for orders) (Physician Order)  . levothyroxine (SYNTHROID) 50 MCG tablet Take 50 mcg by mouth daily before breakfast.  . magnesium hydroxide (MILK OF MAGNESIA) 400 MG/5ML suspension If no BM in 3 days, give 30 cc Milk of Magnesium p.o. x 1 dose in 24 hours as needed (Do not use standing constipation orders for residents with renal failure CFR less than 30. Contact MD for orders) (Physician Order)  . mirtazapine (REMERON) 15 MG tablet Take 15 mg by mouth at bedtime.  . Multiple Vitamin (MULTIVITAMIN WITH MINERALS) TABS tablet Take 1 tablet by mouth 2 (two) times daily.  . NON FORMULARY Mech soft diet  . NON FORMULARY  Take 1 each by mouth in the morning and at bedtime. Magic Cup  . Nutritional Supplement LIQD Take by mouth. MED PASS 120 ML QD BID  . nystatin (NYAMYC) powder Apply 1 application topically. APPLY TOPICALLY TO BILATERAL ABDOMINAL FOLDS 4 TIMES A DAY DX: CANDIDA, SKIN  . sertraline (ZOLOFT) 25 MG tablet Take 25 mg by mouth daily.  . Sodium Phosphates (RA SALINE ENEMA RE) If not relieved by Biscodyl suppository, give disposable Saline Enema rectally X 1 dose/24 hrs as needed (Do not use constipation  standing orders for residents with renal failure/CFR less than 30. Contact MD for orders)(Physician Or   No facility-administered encounter medications on file as of 01/02/2021.    Review of Systems  GENERAL: No change in appetite, no fatigue, no weight changes, no fever, chills  MOUTH and THROAT: Denies oral discomfort, gingival pain or bleeding RESPIRATORY: no cough, SOB, DOE, wheezing, hemoptysis CARDIAC: No chest pain, edema or palpitations GI: No abdominal pain, diarrhea, constipation, heart burn, nausea or vomiting GU: Denies dysuria, frequency, hematuria, incontinence, or discharge NEUROLOGICAL: Denies dizziness, syncope, numbness, or headache PSYCHIATRIC: Denies feelings of depression or anxiety. No report of hallucinations, insomnia, paranoia, or agitation    Immunization History  Administered Date(s) Administered  . Influenza, High Dose Seasonal PF 06/29/2019  . Influenza-Unspecified 07/12/2020  . Moderna Sars-Covid-2 Vaccination 10/10/2019, 11/07/2019, 08/28/2020  . Pneumococcal Polysaccharide-23 07/03/2011  . Tdap 10/22/2012   Pertinent  Health Maintenance Due  Topic Date Due  . URINE MICROALBUMIN  Never done  . PNA vac Low Risk Adult (2 of 2 - PCV13) 07/02/2012  . INFLUENZA VACCINE  04/22/2021  . FOOT EXAM  Discontinued  . HEMOGLOBIN A1C  Discontinued  . OPHTHALMOLOGY EXAM  Discontinued  . DEXA SCAN  Discontinued   Fall Risk  04/20/2019  Falls in the past year? (No Data)  Comment Emmi Telephone Survey: data to providers prior to load  Number falls in past yr: (No Data)  Comment Emmi Telephone Survey Actual Response =      Vitals:   01/02/21 1000  BP: (!) 121/58  Pulse: 63  Resp: 16  Temp: (!) 97.5 F (36.4 C)  Weight: 152 lb (68.9 kg)  Height: 5\' 3"  (1.6 m)   Body mass index is 26.93 kg/m.  Physical Exam  GENERAL APPEARANCE: Well nourished. In no acute distress. Normal body habitus SKIN:  Skin is warm and dry.  MOUTH and THROAT: Lips are without  lesions. Oral mucosa is moist and without lesions.  RESPIRATORY: Breathing is even & unlabored, BS CTAB CARDIAC: RRR, no murmur,no extra heart sounds, no edema GI: Abdomen soft, normal BS, no masses, no tenderness NEUROLOGICAL: There is no tremor. Speech is clear.  Alert to self, disoriented to time and place. PSYCHIATRIC:  Affect and behavior are appropriate  Labs reviewed: Recent Labs    04/03/20 0000  NA 141  141  K 4.3  4.3  CL 105  105  CO2 25*  25*  BUN 15  15  CREATININE 0.5  0.5  CALCIUM 9.1  9.1   No results for input(s): AST, ALT, ALKPHOS, BILITOT, PROT, ALBUMIN in the last 8760 hours. Recent Labs    04/03/20 0000 05/29/20 0000  WBC 4.1  4.1 4.9  NEUTROABS 2  2 3   HGB 11.3*  11.3* 11.8*  HCT 34*  34* 35*  PLT 110*  110* 114*   Lab Results  Component Value Date   TSH 2.11 11/09/2019   Lab Results  Component  Value Date   HGBA1C 5.9 11/09/2019     Assessment/Plan  1. Advance care planning -Remains to be DNR -Discussed medications, vital signs and weights  2. Hypothyroidism due to acquired atrophy of thyroid Lab Results  Component Value Date   TSH 2.11 11/09/2019   -   Continue levothyroxine  3. Poor appetite -Gained 18.4 lbs  -  Will decrease Remeron from 15 mg daily to 7.5 mg daily  4. Major depression, recurrent, chronic (HCC) -   Mood this is stable, continue sertraline  5. Type 2 diabetes mellitus with other specified complication, without long-term current use of insulin (HCC) Lab Results  Component Value Date   HGBA1C 5.9 11/09/2019   -Diet controlled  6. Alzheimer's dementia without behavioral disturbance, unspecified timing of dementia onset (HCC) -   BIMS score 5/15, ranging in severe cognitive impairment -   Continue supportive care    Family/ staff Communication: Discussed plan of care with daughter and IDT.  Labs/tests ordered: CBC, CMP, A1c, TSH and lipid panel  Goals of care:   Long-term care   Kenard Gower, DNP, MSN, FNP-BC [facility] [phone] (Monday-Friday 8:00 a.m. - 5:00 p.m.) [phone] (after hours)

## 2021-01-03 LAB — BASIC METABOLIC PANEL
BUN: 15 (ref 4–21)
CO2: 27 — AB (ref 13–22)
Chloride: 106 (ref 99–108)
Creatinine: 0.6 (ref 0.5–1.1)
Glucose: 195
Potassium: 3.9 (ref 3.4–5.3)
Sodium: 140 (ref 137–147)

## 2021-01-03 LAB — COMPREHENSIVE METABOLIC PANEL
Albumin: 3.4 — AB (ref 3.5–5.0)
Calcium: 9.2 (ref 8.7–10.7)
GFR calc Af Amer: >90
GFR calc non Af Amer: 82.68
Globulin: 2.3

## 2021-01-03 LAB — LIPID PANEL
Cholesterol: 141 (ref 0–200)
HDL: 43 (ref 35–70)
LDL Cholesterol: 78
Triglycerides: 101 (ref 40–160)

## 2021-01-03 LAB — HEPATIC FUNCTION PANEL
ALT: 11 (ref 7–35)
AST: 22 (ref 13–35)
Alkaline Phosphatase: 103 (ref 25–125)

## 2021-01-03 LAB — CBC AND DIFFERENTIAL
HCT: 34 — AB (ref 36–46)
Hemoglobin: 11.9 — AB (ref 12.0–16.0)
Platelets: 197 (ref 150–399)
WBC: 4.2

## 2021-01-03 LAB — CBC: RBC: 3.58 — AB (ref 3.87–5.11)

## 2021-02-05 ENCOUNTER — Non-Acute Institutional Stay (SKILLED_NURSING_FACILITY): Payer: Medicare Other | Admitting: Adult Health

## 2021-02-05 ENCOUNTER — Encounter: Payer: Self-pay | Admitting: Adult Health

## 2021-02-05 DIAGNOSIS — G309 Alzheimer's disease, unspecified: Secondary | ICD-10-CM

## 2021-02-05 DIAGNOSIS — E034 Atrophy of thyroid (acquired): Secondary | ICD-10-CM

## 2021-02-05 DIAGNOSIS — R630 Anorexia: Secondary | ICD-10-CM | POA: Diagnosis not present

## 2021-02-05 DIAGNOSIS — E1169 Type 2 diabetes mellitus with other specified complication: Secondary | ICD-10-CM

## 2021-02-05 DIAGNOSIS — F0280 Dementia in other diseases classified elsewhere without behavioral disturbance: Secondary | ICD-10-CM

## 2021-02-05 DIAGNOSIS — F339 Major depressive disorder, recurrent, unspecified: Secondary | ICD-10-CM

## 2021-02-05 NOTE — Progress Notes (Signed)
Location:  [facility] Room Number: 125-A Place of Service:  SNF (31) Provider:  Kenard Gower, DNP, FNP-BC  Patient Care Team: Pecola Lawless, MD as PCP - General (Internal Medicine) Medina-Vargas, Margit Banda, NP as Nurse Practitioner (Internal Medicine)  Extended Emergency Contact Information Primary Emergency Contact: [name],[name] Address: [address]          [place], [place] [address] [place] of [place] Home Phone: [phone] Mobile Phone: [phone] Relation: Daughter  Code Status:  DNR  Goals of care: Advanced Directive information Advanced Directives 02/05/2021  Does Patient Have a Medical Advance Directive? Yes  Type of Estate agent of [place];Out of facility DNR (pink MOST or yellow form)  Does patient want to make changes to medical advance directive? No - Patient declined  Copy of Healthcare Power of Attorney in Chart? Yes - validated most recent copy scanned in chart (See row information)  Would patient like information on creating a medical advance directive? -  Pre-existing out of facility DNR order (yellow form or pink MOST form) -     Chief Complaint  Patient presents with  . Medical Management of Chronic Issues    Routine Visit.    HPI:  Pt is a 87 y.o. female seen today for medical management of chronic diseases. She has a PMH of Alzheimer's disease, CVA, depression, hypertension, diabetes mellitus and hypothyroidism. Latest A1C 7.2, 01/03/21. She is not on any diabetic medications. Latest weight 148.2 lbs, stable. She takes Mirtazapine 7.5 mg at bedtime for poor appetite.    Past Medical History:  Diagnosis Date  . Cerebrovascular disease   . Dementia (HCC)   . Depression   . DM type 2 (diabetes mellitus, type 2) (HCC) 07/02/2011  . Elevated LFTs   . Frequent falls   . HTN (hypertension) 07/02/2011  . Thrombocytopenia (HCC)   . UTI (lower urinary tract infection)    Past Surgical  History:  Procedure Laterality Date  . ABDOMINAL HYSTERECTOMY    . CHOLECYSTECTOMY      No Known Allergies  Outpatient Encounter Medications as of 02/05/2021  Medication Sig  . acetaminophen (TYLENOL) 325 MG tablet Take 650 mg by mouth every 6 (six) hours as needed.  Marland Kitchen aspirin EC 81 MG tablet Take 81 mg by mouth daily.  . bisacodyl (DULCOLAX) 10 MG suppository If not relieved by MOM, give 10 mg Bisacodyl suppositiory rectally X 1 dose in 24 hours as needed (Do not use constipation standing orders for residents with renal failure/CFR less than 30. Contact MD for orders) (Physician Order)  . levothyroxine (SYNTHROID) 50 MCG tablet Take 50 mcg by mouth daily before breakfast.  . magnesium hydroxide (MILK OF MAGNESIA) 400 MG/5ML suspension If no BM in 3 days, give 30 cc Milk of Magnesium p.o. x 1 dose in 24 hours as needed (Do not use standing constipation orders for residents with renal failure CFR less than 30. Contact MD for orders) (Physician Order)  . mirtazapine (REMERON) 15 MG tablet Take 15 mg by mouth at bedtime.  . Multiple Vitamin (MULTIVITAMIN WITH MINERALS) TABS tablet Take 1 tablet by mouth 2 (two) times daily.  . NON FORMULARY Mech soft diet  . nystatin (MYCOSTATIN/NYSTOP) powder Apply 1 application topically. APPLY TOPICALLY TO BILATERAL ABDOMINAL FOLDS 4 TIMES A DAY DX: CANDIDA, SKIN  . sertraline (ZOLOFT) 25 MG tablet Take 25 mg by mouth daily.  . Sodium Phosphates (RA SALINE ENEMA RE) If not relieved by Biscodyl suppository, give disposable Saline  Enema rectally X 1 dose/24 hrs as needed (Do not use constipation standing orders for residents with renal failure/CFR less than 30. Contact MD for orders)(Physician Or  . [DISCONTINUED] NON FORMULARY Take 1 each by mouth in the morning and at bedtime. Magic Cup  . [DISCONTINUED] Nutritional Supplement LIQD Take by mouth. MED PASS 120 ML QD BID   No facility-administered encounter medications on file as of 02/05/2021.    Review of  Systems  GENERAL: No fever or chills  MOUTH and THROAT: Denies oral discomfort, gingival pain or bleeding RESPIRATORY: no cough, SOB, DOE, wheezing, hemoptysis CARDIAC: No chest pain, edema or palpitations GI: No abdominal pain, diarrhea, constipation, heart burn, nausea or vomiting GU: Denies dysuria, frequency, hematuria or discharge NEUROLOGICAL: Denies dizziness, syncope, numbness, or headache PSYCHIATRIC: Denies feelings of depression or anxiety. No report of hallucinations, insomnia, paranoia, or agitation   Immunization History  Administered Date(s) Administered  . Influenza, High Dose Seasonal PF 06/29/2019  . Influenza-Unspecified 07/12/2020  . Moderna Sars-Covid-2 Vaccination 10/10/2019, 11/07/2019, 08/28/2020  . Pneumococcal Polysaccharide-23 07/03/2011  . Tdap 10/22/2012   Pertinent  Health Maintenance Due  Topic Date Due  . URINE MICROALBUMIN  Never done  . PNA vac Low Risk Adult (2 of 2 - PCV13) 07/02/2012  . INFLUENZA VACCINE  04/22/2021  . FOOT EXAM  Discontinued  . HEMOGLOBIN A1C  Discontinued  . OPHTHALMOLOGY EXAM  Discontinued  . DEXA SCAN  Discontinued   Fall Risk  04/20/2019  Falls in the past year? (No Data)  Comment Emmi Telephone Survey: data to providers prior to load  Number falls in past yr: (No Data)  Comment Emmi Telephone Survey Actual Response =      Vitals:   02/05/21 1416  BP: (!) 111/52  Pulse: 71  Resp: 20  Temp: (!) 97.3 F (36.3 C)  Weight: 148 lb 3.2 oz (67.2 kg)  Height: 5\' 3"  (1.6 m)   Body mass index is 26.25 kg/m.  Physical Exam  GENERAL APPEARANCE: Well nourished. In no acute distress. Normal body habitus SKIN:  Skin is warm and dry.  MOUTH and THROAT: Lips are without lesions. Oral mucosa is moist and without lesions.  RESPIRATORY: Breathing is even & unlabored, BS CTAB CARDIAC: RRR, no murmur,no extra heart sounds, no edema GI: Abdomen soft, normal BS, no masses, no tenderness NEUROLOGICAL: There is no tremor.  Speech is clear. Alert to self, disoriented to time and place. PSYCHIATRIC:  Affect and behavior are appropriate  Labs reviewed: Recent Labs    04/03/20 0000 01/03/21 0000  NA 141  141 140  K 4.3  4.3 3.9  CL 105  105 106  CO2 25*  25* 27*  BUN 15  15 15   CREATININE 0.5  0.5 0.6  CALCIUM 9.1  9.1 9.2   Recent Labs    01/03/21 0000  AST 22  ALT 11  ALKPHOS 103  ALBUMIN 3.4*   Recent Labs    04/03/20 0000 05/29/20 0000 01/03/21 0000  WBC 4.1  4.1 4.9 4.2  NEUTROABS 2  2 3   --   HGB 11.3*  11.3* 11.8* 11.9*  HCT 34*  34* 35* 34*  PLT 110*  110* 114* 197   Lab Results  Component Value Date   TSH 2.11 11/09/2019   Lab Results  Component Value Date   HGBA1C 5.9 11/09/2019   Lab Results  Component Value Date   CHOL 141 01/03/2021   HDL 43 01/03/2021   LDLCALC 78 01/03/2021  TRIG 101 01/03/2021     Assessment/Plan  1. Hypothyroidism due to acquired atrophy of thyroid Lab Results  Component Value Date   TSH 2.11 11/09/2019   -   Continue levothyroxine  2. Type 2 diabetes mellitus with other specified complication, without long-term current use of insulin (HCC) -  hgbA1C 7.2 -  Diet-controlled  3. Major depression, recurrent, chronic (HCC) -  Mood is stable, continue sertraline -   Followed by psych NP  4. Poor appetite Wt Readings from Last 3 Encounters:  02/05/21 148 lb 3.2 oz (67.2 kg)  01/02/21 152 lb (68.9 kg)  12/11/20 132 lb 12.8 oz (60.2 kg)   -   Continue mirtazapine  5. Alzheimer's dementia without behavioral disturbance, unspecified timing of dementia onset (HCC) -  BIMS score 5/15, ranging in severe cognitive impairment -   Continue supportive care    Family/ staff Communication:   Discussed plan of care with resident and charge nurse.  Labs/tests ordered:  None  Goals of care:  Long-term care     Kenard Gower, DNP, MSN, FNP-BC [facility] [phone] (Monday-Friday 8:00  a.m. - 5:00 p.m.) [phone] (after hours)

## 2021-03-11 LAB — HEMOGLOBIN A1C: Hemoglobin A1C: 7.1

## 2021-04-01 ENCOUNTER — Encounter: Payer: Self-pay | Admitting: Adult Health

## 2021-04-01 ENCOUNTER — Non-Acute Institutional Stay (SKILLED_NURSING_FACILITY): Payer: Medicare Other | Admitting: Adult Health

## 2021-04-01 DIAGNOSIS — E1169 Type 2 diabetes mellitus with other specified complication: Secondary | ICD-10-CM

## 2021-04-01 DIAGNOSIS — E034 Atrophy of thyroid (acquired): Secondary | ICD-10-CM | POA: Diagnosis not present

## 2021-04-01 DIAGNOSIS — F0280 Dementia in other diseases classified elsewhere without behavioral disturbance: Secondary | ICD-10-CM

## 2021-04-01 DIAGNOSIS — R630 Anorexia: Secondary | ICD-10-CM

## 2021-04-01 DIAGNOSIS — G309 Alzheimer's disease, unspecified: Secondary | ICD-10-CM

## 2021-04-01 DIAGNOSIS — F339 Major depressive disorder, recurrent, unspecified: Secondary | ICD-10-CM

## 2021-04-01 NOTE — Progress Notes (Signed)
Location:  [facility] Room Number: 125-A Place of Service:  SNF (31) Provider:  Kenard Gower, DNP, FNP-BC  Patient Care Team: Pecola Lawless, MD as PCP - General (Internal Medicine) Medina-Vargas, Margit Banda, NP as Nurse Practitioner (Internal Medicine)  Extended Emergency Contact Information Primary Emergency Contact: [name],[name] Address: [address]          [place], [place] [address] [place] of [place] Home Phone: [phone] Mobile Phone: [phone] Relation: Daughter  Code Status: DNR   Goals of care: Advanced Directive information Advanced Directives 04/01/2021  Does Patient Have a Medical Advance Directive? Yes  Type of Advance Directive Out of facility DNR (pink MOST or yellow form)  Does patient want to make changes to medical advance directive? No - Guardian declined  Copy of Healthcare Power of Attorney in Chart? -  Would patient like information on creating a medical advance directive? -  Pre-existing out of facility DNR order (yellow form or pink MOST form) Yellow form placed in chart (order not valid for inpatient use)     Chief Complaint  Patient presents with   Medical Management of Chronic Issues    Routine    HPI:  Pt is a 87 y.o. female seen today for medical management of chronic diseases.     Past Medical History:  Diagnosis Date   Cerebrovascular disease    Dementia (HCC)    Depression    DM type 2 (diabetes mellitus, type 2) (HCC) 07/02/2011   Elevated LFTs    Frequent falls    HTN (hypertension) 07/02/2011   Thrombocytopenia (HCC)    UTI (lower urinary tract infection)    Past Surgical History:  Procedure Laterality Date   ABDOMINAL HYSTERECTOMY     CHOLECYSTECTOMY      No Known Allergies  Outpatient Encounter Medications as of 04/01/2021  Medication Sig   acetaminophen (TYLENOL) 325 MG tablet Take 650 mg by mouth every 6 (six) hours as needed.   aspirin EC 81 MG tablet Take 81 mg by mouth  daily.   bisacodyl (DULCOLAX) 10 MG suppository If not relieved by MOM, give 10 mg Bisacodyl suppositiory rectally X 1 dose in 24 hours as needed (Do not use constipation standing orders for residents with renal failure/CFR less than 30. Contact MD for orders) (Physician Order)   levothyroxine (SYNTHROID) 50 MCG tablet Take 50 mcg by mouth daily before breakfast.   magnesium hydroxide (MILK OF MAGNESIA) 400 MG/5ML suspension If no BM in 3 days, give 30 cc Milk of Magnesium p.o. x 1 dose in 24 hours as needed (Do not use standing constipation orders for residents with renal failure CFR less than 30. Contact MD for orders) (Physician Order)   mirtazapine (REMERON) 15 MG tablet Take 15 mg by mouth at bedtime.   Multiple Vitamin (MULTIVITAMIN WITH MINERALS) TABS tablet Take 1 tablet by mouth 2 (two) times daily.   NON FORMULARY Diet:Mech soft/thin   sertraline (ZOLOFT) 25 MG tablet Take 25 mg by mouth daily.   Sodium Phosphates (RA SALINE ENEMA RE) If not relieved by Biscodyl suppository, give disposable Saline Enema rectally X 1 dose/24 hrs as needed (Do not use constipation standing orders for residents with renal failure/CFR less than 30. Contact MD for orders)(Physician Or   [DISCONTINUED] nystatin (MYCOSTATIN/NYSTOP) powder Apply 1 application topically. APPLY TOPICALLY TO BILATERAL ABDOMINAL FOLDS 4 TIMES A DAY DX: CANDIDA, SKIN   No facility-administered encounter medications on file as of 04/01/2021.    Review of Systems  GENERAL: No change in appetite, no fatigue, no weight changes, no fever, chills or weakness SKIN: Denies rash, itching, wounds, ulcer sores, or nail abnormalities EYES: Denies change in vision, dry eyes, eye pain, itching or discharge EARS: Denies change in hearing, ringing in ears, or earache NOSE: Denies nasal congestion or epistaxis MOUTH and THROAT: Denies oral discomfort, gingival pain or bleeding, pain from teeth or hoarseness   RESPIRATORY: no cough, SOB, DOE,  wheezing, hemoptysis CARDIAC: No chest pain, edema or palpitations GI: No abdominal pain, diarrhea, constipation, heart burn, nausea or vomiting GU: Denies dysuria, frequency, hematuria, incontinence, or discharge MUSCULOSKELETAL: Denies joint pain, muscle pain, back pain, restricted movement, or unusual weakness CIRCULATION: Denies claudication, edema of legs, varicosities, or cold extremities NEUROLOGICAL: Denies dizziness, syncope, numbness, or headache PSYCHIATRIC: Denies feelings of depression or anxiety. No report of hallucinations, insomnia, paranoia, or agitation ENDOCRINE: Denies polyphagia, polyuria, polydipsia, heat or cold intolerance HEME/LYMPH: Denies excessive bruising, petechia, enlarged lymph nodes, or bleeding problems IMMUNOLOGIC: Denies history of frequent infections, AIDS, or use of immunosuppressive agents   Immunization History  Administered Date(s) Administered   Influenza, High Dose Seasonal PF 06/29/2019   Influenza-Unspecified 07/12/2020   Moderna Sars-Covid-2 Vaccination 10/10/2019, 11/07/2019, 08/28/2020   Pneumococcal Polysaccharide-23 07/03/2011   Tdap 10/22/2012   Pertinent  Health Maintenance Due  Topic Date Due   URINE MICROALBUMIN  Never done   PNA vac Low Risk Adult (2 of 2 - PCV13) 07/02/2012   INFLUENZA VACCINE  04/22/2021   FOOT EXAM  Discontinued   HEMOGLOBIN A1C  Discontinued   OPHTHALMOLOGY EXAM  Discontinued   DEXA SCAN  Discontinued   Fall Risk  04/20/2019  Falls in the past year? (No Data)  Comment Emmi Telephone Survey: data to providers prior to load  Number falls in past yr: (No Data)  Comment Emmi Telephone Survey Actual Response =      Vitals:   04/01/21 1230  BP: (!) 124/58  Pulse: 65  Resp: 17  Temp: (!) 97.5 F (36.4 C)  Weight: 149 lb 9.6 oz (67.9 kg)  Height: 5\' 3"  (1.6 m)   Body mass index is 26.5 kg/m.  Physical Exam  GENERAL APPEARANCE: Well nourished. In no acute distress. Normal body habitus SKIN:  Skin  is warm and dry. There are no suspicious lesions or rash HEAD: Normal in size and contour. No evidence of trauma EYES: Lids open and close normally. No blepharitis, entropion or ectropion. PERRL. Conjunctivae are clear and sclerae are white. Lenses are without opacity EARS: Pinnae are normal. Patient hears normal voice tunes of the examiner MOUTH and THROAT: Lips are without lesions. Oral mucosa is moist and without lesions. Tongue is normal in shape, size, and color and without lesions NECK: supple, trachea midline, no neck masses, no thyroid tenderness, no thyromegaly LYMPHATICS: No LAN in the neck, no supraclavicular LAN RESPIRATORY: Breathing is even & unlabored, BS CTAB CARDIAC: RRR, no murmur,no extra heart sounds, no edema GI: Abdomen soft, normal BS, no masses, no tenderness, no hepatomegaly, no splenomegaly MUSCULOSKELETAL: No deformities. Movement at each extremity is full and painless. Strength is 5/5 at each extremity. Back is without kyphosis or scoliosis CIRCULATION: Pedal pulses are 2+. There is no edema of the legs, ankles and feet NEUROLOGICAL: There is no tremor. Speech is clear PSYCHIATRIC: Alert and oriented X 3. Affect and behavior are appropriate  Labs reviewed: Recent Labs    04/03/20 0000 01/03/21 0000  NA 141  141 140  K 4.3  4.3  3.9  CL 105  105 106  CO2 25*  25* 27*  BUN 15  15 15   CREATININE 0.5  0.5 0.6  CALCIUM 9.1  9.1 9.2   Recent Labs    01/03/21 0000  AST 22  ALT 11  ALKPHOS 103  ALBUMIN 3.4*   Recent Labs    04/03/20 0000 05/29/20 0000 01/03/21 0000  WBC 4.1  4.1 4.9 4.2  NEUTROABS 2  2 3   --   HGB 11.3*  11.3* 11.8* 11.9*  HCT 34*  34* 35* 34*  PLT 110*  110* 114* 197   Lab Results  Component Value Date   TSH 2.11 11/09/2019   Lab Results  Component Value Date   HGBA1C 7.1 03/11/2021   Lab Results  Component Value Date   CHOL 141 01/03/2021   HDL 43 01/03/2021   LDLCALC 78 01/03/2021   TRIG 101 01/03/2021     Significant Diagnostic Results in last 30 days:  No results found.  Assessment/Plan    Family/ staff Communication:   Labs/tests ordered:    Goals of care:      01/05/2021, DNP, MSN, FNP-BC [facility] [phone] (Monday-Friday 8:00 a.m. - 5:00 p.m.) [phone] (after hours)

## 2021-04-03 ENCOUNTER — Encounter: Payer: Self-pay | Admitting: Adult Health

## 2021-04-03 LAB — CBC AND DIFFERENTIAL
HCT: 31 — AB (ref 36–46)
Hemoglobin: 11.2 — AB (ref 12.0–16.0)
Platelets: 176 (ref 150–399)
WBC: 3.1

## 2021-04-03 LAB — BASIC METABOLIC PANEL
BUN: 17 (ref 4–21)
CO2: 25 — AB (ref 13–22)
Chloride: 100 (ref 99–108)
Creatinine: 0.6 (ref <=1.1)
Glucose: 396
Potassium: 4.1 (ref 3.4–5.3)
Sodium: 136 — AB (ref 137–147)

## 2021-04-03 LAB — COMPREHENSIVE METABOLIC PANEL
Calcium: 8.9 (ref 8.7–10.7)
GFR calc Af Amer: >90
GFR calc non Af Amer: 81.63

## 2021-04-03 LAB — CBC: RBC: 3.33 — AB (ref 3.87–5.11)

## 2021-04-03 NOTE — Progress Notes (Signed)
Location:  [facility] Room Number: 125-A Place of Service:  SNF (31) Provider:  Kenard Gower, DNP, FNP-BC  Patient Care Team: Pecola Lawless, MD as PCP - General (Internal Medicine) Medina-Vargas, Margit Banda, NP as Nurse Practitioner (Internal Medicine)  Extended Emergency Contact Information Primary Emergency Contact: [name],[name] Address: [address]          [place], [place] [address] Darden Amber of [place] Home Phone: [phone] Mobile Phone: [phone] Relation: Daughter  Code Status: DNR   Goals of care: Advanced Directive information Advanced Directives 04/01/2021  Does Patient Have a Medical Advance Directive? Yes  Type of Advance Directive Out of facility DNR (pink MOST or yellow form)  Does patient want to make changes to medical advance directive? No - Guardian declined  Copy of Healthcare Power of Attorney in Chart? -  Would patient like information on creating a medical advance directive? -  Pre-existing out of facility DNR order (yellow form or pink MOST form) Yellow form placed in chart (order not valid for inpatient use)     Chief Complaint  Patient presents with   Medical Management of Chronic Issues    Routine Visit    HPI:  Pt is a 87 y.o. female seen today for medical management of chronic diseases. She is a long-term resident of [facility]. She has a PMH of Alzheimer's disease, CVA, depression, hypertension, diabetes mellitus and hypothyroidism. Restorative exercises were discontinued due to consistent refusals. Latest weight is 149.6 lbs, gained 6.4 lbs in a month. She takes Mirtazapine 7.5 mg at bedtime to increase appetite. She does not take any medication for diabetes. Latest A1C 7.1, 03/11/21. She takes Levothyroxine 50 mcg daily for diabetes.   Past Medical History:  Diagnosis Date   Cerebrovascular disease    Dementia (HCC)    Depression    DM type 2 (diabetes mellitus, type 2) (HCC)  07/02/2011   Elevated LFTs    Frequent falls    HTN (hypertension) 07/02/2011   Thrombocytopenia (HCC)    UTI (lower urinary tract infection)    Past Surgical History:  Procedure Laterality Date   ABDOMINAL HYSTERECTOMY     CHOLECYSTECTOMY      No Known Allergies  Outpatient Encounter Medications as of 04/01/2021  Medication Sig   acetaminophen (TYLENOL) 325 MG tablet Take 650 mg by mouth every 6 (six) hours as needed.   aspirin EC 81 MG tablet Take 81 mg by mouth daily.   bisacodyl (DULCOLAX) 10 MG suppository If not relieved by MOM, give 10 mg Bisacodyl suppositiory rectally X 1 dose in 24 hours as needed (Do not use constipation standing orders for residents with renal failure/CFR less than 30. Contact MD for orders) (Physician Order)   levothyroxine (SYNTHROID) 50 MCG tablet Take 50 mcg by mouth daily before breakfast.   magnesium hydroxide (MILK OF MAGNESIA) 400 MG/5ML suspension If no BM in 3 days, give 30 cc Milk of Magnesium p.o. x 1 dose in 24 hours as needed (Do not use standing constipation orders for residents with renal failure CFR less than 30. Contact MD for orders) (Physician Order)   mirtazapine (REMERON) 15 MG tablet Take 15 mg by mouth at bedtime.   Multiple Vitamin (MULTIVITAMIN WITH MINERALS) TABS tablet Take 1 tablet by mouth 2 (two) times daily.   NON FORMULARY Diet:Mech soft/thin   sertraline (ZOLOFT) 25 MG tablet Take 25 mg by mouth daily.   Sodium Phosphates (RA SALINE ENEMA RE) If not relieved by  Biscodyl suppository, give disposable Saline Enema rectally X 1 dose/24 hrs as needed (Do not use constipation standing orders for residents with renal failure/CFR less than 30. Contact MD for orders)(Physician Or   No facility-administered encounter medications on file as of 04/01/2021.    Review of Systems  Unable to obtain due to dementia   Immunization History  Administered Date(s) Administered   Influenza, High Dose Seasonal PF 06/29/2019    Influenza-Unspecified 07/12/2020   Moderna Sars-Covid-2 Vaccination 10/10/2019, 11/07/2019, 08/28/2020   Pneumococcal Polysaccharide-23 07/03/2011   Tdap 10/22/2012   Pertinent  Health Maintenance Due  Topic Date Due   URINE MICROALBUMIN  Never done   PNA vac Low Risk Adult (2 of 2 - PCV13) 07/02/2012   INFLUENZA VACCINE  04/22/2021   FOOT EXAM  Discontinued   HEMOGLOBIN A1C  Discontinued   OPHTHALMOLOGY EXAM  Discontinued   DEXA SCAN  Discontinued   Fall Risk  04/20/2019  Falls in the past year? (No Data)  Comment Emmi Telephone Survey: data to providers prior to load  Number falls in past yr: (No Data)  Comment Emmi Telephone Survey Actual Response =      Vitals:   04/03/21 1510  BP: (!) 124/58  Pulse: 65  Resp: 17  Temp: (!) 97.5 F (36.4 C)  Weight: 149 lb 9.6 oz (67.9 kg)  Height: 5\' 3"  (1.6 m)   Body mass index is 26.5 kg/m.  Physical Exam  GENERAL APPEARANCE: Well nourished. In no acute distress. Normal body habitus SKIN:  Skin is warm and dry.  MOUTH and THROAT: Lips are without lesions. Oral mucosa is moist and without lesions.  RESPIRATORY: Breathing is even & unlabored, BS CTAB CARDIAC: RRR, no murmur,no extra heart sounds, no edema GI: Abdomen soft, normal BS, no masses, no tenderness NEUROLOGICAL: There is no tremor. Speech is clear. Alert to self, disoriented to time and place. PSYCHIATRIC:  Affect and behavior are appropriate  Labs reviewed: Recent Labs    01/03/21 0000  NA 140  K 3.9  CL 106  CO2 27*  BUN 15  CREATININE 0.6  CALCIUM 9.2   Recent Labs    01/03/21 0000  AST 22  ALT 11  ALKPHOS 103  ALBUMIN 3.4*   Recent Labs    05/29/20 0000 01/03/21 0000  WBC 4.9 4.2  NEUTROABS 3  --   HGB 11.8* 11.9*  HCT 35* 34*  PLT 114* 197   Lab Results  Component Value Date   TSH 2.11 11/09/2019   Lab Results  Component Value Date   HGBA1C 7.1 03/11/2021   Lab Results  Component Value Date   CHOL 141 01/03/2021   HDL 43  01/03/2021   LDLCALC 78 01/03/2021   TRIG 101 01/03/2021    Significant Diagnostic Results in last 30 days:  No results found.  Assessment/Plan  1. Poor appetite Wt Readings from Last 3 Encounters:  04/03/21 149 lb 9.6 oz (67.9 kg)  04/01/21 149 lb 9.6 oz (67.9 kg)  02/05/21 148 lb 3.2 oz (67.2 kg)   -  weight is stable -  Continue Remeron  2. Type 2 diabetes mellitus with other specified complication, without long-term current use of insulin (HCC) Lab Results  Component Value Date   HGBA1C 7.1 03/11/2021   -   Diet controlled  3. Hypothyroidism due to acquired atrophy of thyroid Lab Results  Component Value Date   TSH 2.11 11/09/2019   -Continue levothyroxine  4. Major depression, recurrent, chronic (HCC) -  mood is stable, continue sertraline  5. Alzheimer's dementia without behavioral disturbance, unspecified timing of dementia onset (HCC) -  BIMS score 4/15, ranging in severe cognitive impairment -  continue supportive care     Family/ staff Communication: Discussed plan of care with resident and charge nurse.  Labs/tests ordered: None  Goals of care:   Long-term care   Kenard Gower, DNP, MSN, FNP-BC [facility] [phone] (Monday-Friday 8:00 a.m. - 5:00 p.m.) [phone] (after hours)

## 2021-04-03 NOTE — Progress Notes (Signed)
This encounter was created in error - please disregard.

## 2021-04-18 ENCOUNTER — Non-Acute Institutional Stay (SKILLED_NURSING_FACILITY): Payer: Medicare Other | Admitting: Internal Medicine

## 2021-04-18 ENCOUNTER — Encounter: Payer: Self-pay | Admitting: Internal Medicine

## 2021-04-18 DIAGNOSIS — D696 Thrombocytopenia, unspecified: Secondary | ICD-10-CM

## 2021-04-18 DIAGNOSIS — E1151 Type 2 diabetes mellitus with diabetic peripheral angiopathy without gangrene: Secondary | ICD-10-CM | POA: Diagnosis not present

## 2021-04-18 DIAGNOSIS — D649 Anemia, unspecified: Secondary | ICD-10-CM

## 2021-04-18 NOTE — Progress Notes (Signed)
   NURSING HOME LOCATION:  [facility] ROOM NUMBER:  125 A  CODE STATUS:  DNR  PCP:  Douglass Rivers MD  This is a nursing facility follow up visit for of chronic medical diagnoses & to document compliance with Regulation 483.30 (c) in The Long Term Care Survey Manual Phase 2 which mandates caregiver visit ( visits can alternate among physician, PA or NP as per statutes) within 10 days of 30 days / 60 days/ 90 days post admission to SNF date    Interim medical record and care since last SNF visit was updated with review of diagnostic studies and change in clinical status since last visit were documented.  HPI: She is a permanent resident of this facility with diagnoses of history of cerebrovascular disease with vascular dementia, chronic depression, essential hypertension, thrombocytopenia, and history of diabetes with neurovascular complications. Surgeries include abdominal hysterectomy and cholecystectomy. Most recent labs were performed 7/13; random glucose was 396.  Most recent glucoses have ranged from a low of 107 up to 194.  A1c on 6/20 was 7.1% indicating good control.  Renal function is surprisingly good with a creatinine of 0.61 and GFR of 82. On 7/13 White count was 3100 and H/H11.2/31.3 down from her previous hemoglobin of 11.9.  Review of systems: Dementia invalidated responses.  She could provide no meaningful history.  She focused on wanting to get back in bed from her wheelchair.  She stated that she did not feel well but when asked why, her response was "I do not know".  She made no specific complaints.  Physical exam:  Pertinent or positive findings: She appears her age and chronically ill.  Hair is thin and unkempt.  Anisocoria is present with the left pupil slightly larger than the right.  She is edentulous and the mouth is sunken.  Breath sounds are decreased.  Abdomen is protuberant.  She has 1/2+ edema at the sock line.  Pedal pulses were decreased.  Limbs are atrophic.  General appearance: no acute distress, increased work of breathing is present.   Lymphatic: No lymphadenopathy about the head, neck, axilla. Eyes: No conjunctival inflammation or lid edema is present. There is no scleral icterus. Ears:  External ear exam shows no significant lesions or deformities.   Nose:  External nasal examination shows no deformity or inflammation. Nasal mucosa are pink and moist without lesions, exudates Neck:  No thyromegaly, masses, tenderness noted.    Heart:  Normal rate and regular rhythm. S1 and S2 normal without gallop, murmur, click, rub .  Lungs:  without wheezes, rhonchi, rales, rubs. Abdomen: Bowel sounds are normal. Abdomen is soft and nontender with no organomegaly, hernias, masses. GU: Deferred  Extremities:  No cyanosis, clubbing  Neurologic exam :Balance, Rhomberg, finger to nose testing could not be completed due to clinical state Skin: Warm & dry w/o tenting. No significant lesions or rash.  See summary under each active problem in the Problem List with associated updated therapeutic plan

## 2021-04-18 NOTE — Assessment & Plan Note (Addendum)
04/03/2021 H/H 11.2/31.3.  Slight drop in hemoglobin from prior value of 11.9.  No active bleeding dyscrasias reported at the SNF. Monitor CBC

## 2021-04-18 NOTE — Patient Instructions (Signed)
See assessment and plan under each diagnosis in the problem list and acutely for this visit 

## 2021-04-18 NOTE — Assessment & Plan Note (Signed)
04/03/2021 platelet count 176,000.  TCP resolved.

## 2021-04-18 NOTE — Assessment & Plan Note (Signed)
DM with neurovascular complications Glucose range  @ SNF: 107-194 Current A1c: 7.1% A1c goal : < 8% No hypoglycemia No change indicated

## 2021-05-13 ENCOUNTER — Encounter: Payer: Self-pay | Admitting: Adult Health

## 2021-05-13 ENCOUNTER — Non-Acute Institutional Stay (SKILLED_NURSING_FACILITY): Payer: Medicare Other | Admitting: Adult Health

## 2021-05-13 DIAGNOSIS — R630 Anorexia: Secondary | ICD-10-CM | POA: Diagnosis not present

## 2021-05-13 DIAGNOSIS — F339 Major depressive disorder, recurrent, unspecified: Secondary | ICD-10-CM

## 2021-05-13 DIAGNOSIS — D649 Anemia, unspecified: Secondary | ICD-10-CM | POA: Diagnosis not present

## 2021-05-13 DIAGNOSIS — F0280 Dementia in other diseases classified elsewhere without behavioral disturbance: Secondary | ICD-10-CM

## 2021-05-13 DIAGNOSIS — E034 Atrophy of thyroid (acquired): Secondary | ICD-10-CM | POA: Diagnosis not present

## 2021-05-13 DIAGNOSIS — G309 Alzheimer's disease, unspecified: Secondary | ICD-10-CM

## 2021-05-13 NOTE — Progress Notes (Signed)
Location:  [facility] Room Number: 125-A Place of Service:  SNF (31) Provider:  Kenard Gower, DNP, FNP-BC  Patient Care Team: Pecola Lawless, MD as PCP - General (Internal Medicine) Medina-Vargas, Margit Banda, NP as Nurse Practitioner (Internal Medicine)  Extended Emergency Contact Information Primary Emergency Contact: [name],[name] Address: [address]          [place], [place] [address] [place] of [place] Home Phone: [phone] Mobile Phone: [phone] Relation: Daughter   Code Status: DNR   Goals of care: Advanced Directive information Advanced Directives 05/23/2021  Does Patient Have a Medical Advance Directive? Yes  Type of Advance Directive Out of facility DNR (pink MOST or yellow form)  Does patient want to make changes to medical advance directive? No - Patient declined  Copy of Healthcare Power of Attorney in Chart? -  Would patient like information on creating a medical advance directive? -  Pre-existing out of facility DNR order (yellow form or pink MOST form) Yellow form placed in chart (order not valid for inpatient use)     Chief Complaint  Patient presents with   Medical Management of Chronic Issues    Routine Visit    HPI:  Pt is a 87 y.o. female seen today for medical management of chronic diseases. She is a long-term care resident of [facility]. She has a PMH of Alzheimer's disease, CVA, depression, hypertension, diabetes mellitus and hypothyroidism. Latest hgb 11.2. She takes Mirtazapine 7.5 mg at bedtime to increase appetite. Latest tsh 3.64, 12/2020. She takes levothyroxine 50 mcg 1 tab daily for hypothyroidism.   Past Medical History:  Diagnosis Date   Cerebrovascular disease    Dementia (HCC)    Depression    DM type 2 (diabetes mellitus, type 2) (HCC) 07/02/2011   Elevated LFTs    Frequent falls    HTN (hypertension) 07/02/2011   Thrombocytopenia (HCC)    UTI (lower urinary tract  infection)    Past Surgical History:  Procedure Laterality Date   ABDOMINAL HYSTERECTOMY     CHOLECYSTECTOMY      No Known Allergies  Outpatient Encounter Medications as of 05/13/2021  Medication Sig   acetaminophen (TYLENOL) 325 MG tablet Take 650 mg by mouth every 6 (six) hours as needed.   aspirin EC 81 MG tablet Take 81 mg by mouth daily.   bisacodyl (DULCOLAX) 10 MG suppository If not relieved by MOM, give 10 mg Bisacodyl suppositiory rectally X 1 dose in 24 hours as needed (Do not use constipation standing orders for residents with renal failure/CFR less than 30. Contact MD for orders) (Physician Order)   levothyroxine (SYNTHROID) 50 MCG tablet Take 50 mcg by mouth daily before breakfast.   magnesium hydroxide (MILK OF MAGNESIA) 400 MG/5ML suspension If no BM in 3 days, give 30 cc Milk of Magnesium p.o. x 1 dose in 24 hours as needed (Do not use standing constipation orders for residents with renal failure CFR less than 30. Contact MD for orders) (Physician Order)   mirtazapine (REMERON) 15 MG tablet Take 15 mg by mouth at bedtime.   Multiple Vitamin (MULTIVITAMIN WITH MINERALS) TABS tablet Take 1 tablet by mouth 2 (two) times daily.   NON FORMULARY Diet:Mech soft/thin   sertraline (ZOLOFT) 25 MG tablet Take 25 mg by mouth daily.   Sodium Phosphates (RA SALINE ENEMA RE) If not relieved by Biscodyl suppository, give disposable Saline Enema rectally X 1 dose/24 hrs as needed (Do not use constipation standing orders for  residents with renal failure/CFR less than 30. Contact MD for orders)(Physician Or   No facility-administered encounter medications on file as of 05/13/2021.    Review of Systems  GENERAL: No change in appetite, no fatigue, no weight changes, no fever, chills or weakness MOUTH and THROAT: Denies oral discomfort, gingival pain or bleeding RESPIRATORY: no cough, SOB, DOE, wheezing, hemoptysis CARDIAC: No chest pain, edema or palpitations GI: No abdominal pain, diarrhea,  constipation, heart burn, nausea or vomiting GU: Denies dysuria, frequency, hematuria or discharge NEUROLOGICAL: Denies dizziness, syncope, numbness, or headache PSYCHIATRIC: Denies feelings of depression or anxiety. No report of hallucinations, insomnia, paranoia, or agitation  Immunization History  Administered Date(s) Administered   Influenza, High Dose Seasonal PF 06/29/2019   Influenza-Unspecified 07/12/2020   Moderna Sars-Covid-2 Vaccination 10/10/2019, 11/07/2019, 08/28/2020   Pneumococcal Polysaccharide-23 07/03/2011   Tdap 10/22/2012   Pertinent  Health Maintenance Due  Topic Date Due   URINE MICROALBUMIN  Never done   PNA vac Low Risk Adult (2 of 2 - PCV13) 07/02/2012   INFLUENZA VACCINE  04/22/2021   FOOT EXAM  Discontinued   HEMOGLOBIN A1C  Discontinued   OPHTHALMOLOGY EXAM  Discontinued   DEXA SCAN  Discontinued   Fall Risk  04/20/2019  Falls in the past year? (No Data)  Comment Emmi Telephone Survey: data to providers prior to load  Number falls in past yr: (No Data)  Comment Emmi Telephone Survey Actual Response =      Vitals:   05/13/21 1505  BP: (!) 108/54  Pulse: 66  Resp: 18  Temp: 97.9 F (36.6 C)  Weight: 151 lb 12.8 oz (68.9 kg)  Height: 5\' 3"  (1.6 m)   Body mass index is 26.89 kg/m.  Physical Exam  GENERAL APPEARANCE: Well nourished. In no acute distress. Normal body habitus SKIN:  Skin is warm and dry.  MOUTH and THROAT: Lips are without lesions. Oral mucosa is moist and without lesions.  RESPIRATORY: Breathing is even & unlabored, BS CTAB CARDIAC: RRR, no murmur,no extra heart sounds, no edema GI: Abdomen soft, normal BS, no masses, no tenderness NEUROLOGICAL: There is no tremor. Speech is clear. Alert to self, disoriented to time and place. PSYCHIATRIC:  Affect and behavior are appropriate  Labs reviewed: Recent Labs    01/03/21 0000 04/03/21 0000  NA 140 136*  K 3.9 4.1  CL 106 100  CO2 27* 25*  BUN 15 17  CREATININE 0.6 0.6   CALCIUM 9.2 8.9   Recent Labs    01/03/21 0000  AST 22  ALT 11  ALKPHOS 103  ALBUMIN 3.4*   Recent Labs    05/29/20 0000 01/03/21 0000 04/03/21 0000 05/22/21 0000  WBC 4.9 4.2 3.1 3.8  NEUTROABS 3  --   --   --   HGB 11.8* 11.9* 11.2* 11.5*  HCT 35* 34* 31* 33*  PLT 114* 197 176 233   Lab Results  Component Value Date   TSH 2.11 11/09/2019   Lab Results  Component Value Date   HGBA1C 7.1 03/11/2021   Lab Results  Component Value Date   CHOL 141 01/03/2021   HDL 43 01/03/2021   LDLCALC 78 01/03/2021   TRIG 101 01/03/2021    Significant Diagnostic Results in last 30 days:  No results found.  Assessment/Plan  1. Normocytic anemia -   hgb  11.9 -  stable  2. Poor appetite - latest weight 151.8 lbs, stable -Continue Mirtazapine  3. Hypothyroidism due to acquired atrophy of thyroid -  tsh 3.64, within normal weights -  Continue levothyroxine  4. Major depression, recurrent, chronic (HCC) -  mood is stable, continue Escitalopram  5. Alzheimer's dementia without behavioral disturbance, unspecified timing of dementia onset (HCC)   BIMS score 4/15, discussed plan of care with resident and charge nurse.     Family/ staff Communication: Discussed plan of care with resident and charge nurse.  Labs/tests ordered: None  Goals of care:   Long-term care   Kenard Gower, DNP, MSN, FNP-BC [facility] [phone] (Monday-Friday 8:00 a.m. - 5:00 p.m.) [phone] (after hours)

## 2021-05-20 ENCOUNTER — Non-Acute Institutional Stay (SKILLED_NURSING_FACILITY): Payer: Medicare Other | Admitting: Internal Medicine

## 2021-05-20 ENCOUNTER — Encounter: Payer: Self-pay | Admitting: Internal Medicine

## 2021-05-20 DIAGNOSIS — G309 Alzheimer's disease, unspecified: Secondary | ICD-10-CM | POA: Diagnosis not present

## 2021-05-20 DIAGNOSIS — R072 Precordial pain: Secondary | ICD-10-CM

## 2021-05-20 DIAGNOSIS — F0280 Dementia in other diseases classified elsewhere without behavioral disturbance: Secondary | ICD-10-CM | POA: Diagnosis not present

## 2021-05-20 DIAGNOSIS — R079 Chest pain, unspecified: Secondary | ICD-10-CM | POA: Insufficient documentation

## 2021-05-20 NOTE — Assessment & Plan Note (Addendum)
D-dimer,troponin , PCXR, and EKG. Tylenol prn pending labs

## 2021-05-20 NOTE — Assessment & Plan Note (Signed)
She can not describe the chest even when given options & can not tell me when it started.

## 2021-05-20 NOTE — Progress Notes (Signed)
   NURSING HOME LOCATION:  [facility] ROOM NUMBER:  125 A  CODE STATUS:  DNR  PCP:  Douglass Rivers MD  This is a nursing facility follow up visit for specific acute issue of chest pain.  Interim medical record and care since last SNF visit was updated with review of diagnostic studies and change in clinical status since last visit were documented.  HPI: Staff requested assessment of chest pain.  It was reported that she had a choking episode on Saturday 8/27.  She was found to have desaturation with O2 sats of 78% on room air.  She was placed on 2 L with rise in O2 sats to 99%. Speech Therapy stated that she has had some oral phase dysphagia in the past but not recently.  Past medical history includes depression, diabetes with peripheral vascular disease, essential hypertension, thrombocytopenia, and cerebrovascular disease.  Review of systems: Dementia invalidated responses. Date given as "2002".  She could not name the POTUS.  When asked to describe the pain her response was "I do not know".  She cannot tell me when the pain began.  Physical exam:  Pertinent or positive findings: She is frail, appears chronically ill, and her stated age.  She is hard of hearing.  Eyebrows are absent.  Lower lids are puffy.  She is edentulous.  There is faint hirsutism of the chin.  She cried out in pain when I lightly placed the stethoscope on her chest. Initially she seemed to indicate substernal pain but subsequently she seemed to localize it to the left parasternal area.  Again she cried out in pain as I palpated the chest.  There were no lesions in this area.  Heart sounds are distant.  She has mild low-grade rhonchi on forceful expiration.  The pedal pulses are palpable but are stronger in the left lower extremity and the right.  Denna Haggard' sign is negative.  General appearance: no acute distress, increased work of breathing is present.   Lymphatic: No lymphadenopathy about the head,  neck, axilla. Eyes: No conjunctival inflammation or lid edema is present. There is no scleral icterus. Ears:  External ear exam shows no significant lesions or deformities.   Nose:  External nasal examination shows no deformity or inflammation. Nasal mucosa are pink and moist without lesions, exudates Oral exam:  Lips and gums are healthy appearing. There is no oropharyngeal erythema or exudate. Neck:  No thyromegaly, masses, tenderness noted.    Heart:  No gallop, murmur, click, rub .  Lungs: without wheezes, rales, rubs. Abdomen: Bowel sounds are normal. Abdomen is soft and nontender with no organomegaly, hernias, masses. GU: Deferred  Extremities:  No cyanosis, clubbing, edema  Skin: Warm & dry w/o tenting. No significant lesions or rash.  See summary under each active problem in the Problem List with associated updated therapeutic plan

## 2021-05-20 NOTE — Patient Instructions (Signed)
See assessment and plan under each diagnosis in the problem list and acutely for this visit 

## 2021-05-22 LAB — CBC AND DIFFERENTIAL
HCT: 33 — AB (ref 36–46)
Hemoglobin: 11.5 — AB (ref 12.0–16.0)
Platelets: 233 (ref 150–399)
WBC: 3.8

## 2021-05-22 LAB — CBC: RBC: 3.49 — AB (ref 3.87–5.11)

## 2021-05-23 ENCOUNTER — Encounter: Payer: Self-pay | Admitting: Adult Health

## 2021-05-23 ENCOUNTER — Non-Acute Institutional Stay (SKILLED_NURSING_FACILITY): Payer: Medicare Other | Admitting: Adult Health

## 2021-05-23 DIAGNOSIS — E1151 Type 2 diabetes mellitus with diabetic peripheral angiopathy without gangrene: Secondary | ICD-10-CM

## 2021-05-23 DIAGNOSIS — R7989 Other specified abnormal findings of blood chemistry: Secondary | ICD-10-CM

## 2021-05-23 NOTE — Progress Notes (Signed)
Location:  [facility] Room Number: 125-A Place of Service:  SNF (31) Provider:  Kenard Gower, DNP, FNP-BC  Patient Care Team: Pecola Lawless, MD as PCP - General (Internal Medicine) Medina-Vargas, Margit Banda, NP as Nurse Practitioner (Internal Medicine)  Extended Emergency Contact Information Primary Emergency Contact: [name],[name] Address: [address]          [place], [place] [address] Darden [place] of [place] Home Phone: [phone] Mobile Phone: [phone] Relation: Daughter  Code Status: DNR   Goals of care: Advanced Directive information Advanced Directives 05/23/2021  Does Patient Have a Medical Advance Directive? Yes  Type of Advance Directive Out of facility DNR (pink MOST or yellow form)  Does patient want to make changes to medical advance directive? No - Patient declined  Copy of Healthcare Power of Attorney in Chart? -  Would patient like information on creating a medical advance directive? -  Pre-existing out of facility DNR order (yellow form or pink MOST form) Yellow form placed in chart (order not valid for inpatient use)     Chief Complaint  Patient presents with   Acute Visit    Elevated D-diner    HPI:  Pt is a 87 y.o. female seen today for an acute visit regarding elevated D-dimer. She is a long-term care resident of [facility]. She recently complained of chest pain. Labs were ordered. D-dimer was noted to be elevated at 2.06. She denies having chest pains. Troponin 1 <0.30. EKG done was negative for acute STEMI, sinus rhythm. No SOB was noted. She does not take medication for diabetes mellitus.  Past Medical History:  Diagnosis Date   Cerebrovascular disease    Dementia (HCC)    Depression    DM type 2 (diabetes mellitus, type 2) (HCC) 07/02/2011   Elevated LFTs    Frequent falls    HTN (hypertension) 07/02/2011   Thrombocytopenia (HCC)    UTI (lower urinary tract infection)    Past  Surgical History:  Procedure Laterality Date   ABDOMINAL HYSTERECTOMY     CHOLECYSTECTOMY      No Known Allergies  Outpatient Encounter Medications as of 05/23/2021  Medication Sig   acetaminophen (TYLENOL) 325 MG tablet Take 650 mg by mouth every 6 (six) hours as needed.   apixaban (ELIQUIS) 2.5 MG TABS tablet Take 2.5 mg by mouth 2 (two) times daily. FOR ELEVATED D-DIMER   aspirin EC 81 MG tablet Take 81 mg by mouth daily.   bisacodyl (DULCOLAX) 10 MG suppository If not relieved by MOM, give 10 mg Bisacodyl suppositiory rectally X 1 dose in 24 hours as needed (Do not use constipation standing orders for residents with renal failure/CFR less than 30. Contact MD for orders) (Physician Order)   levothyroxine (SYNTHROID) 50 MCG tablet Take 50 mcg by mouth daily before breakfast.   magnesium hydroxide (MILK OF MAGNESIA) 400 MG/5ML suspension If no BM in 3 days, give 30 cc Milk of Magnesium p.o. x 1 dose in 24 hours as needed (Do not use standing constipation orders for residents with renal failure CFR less than 30. Contact MD for orders) (Physician Order)   mirtazapine (REMERON) 15 MG tablet Take 15 mg by mouth at bedtime.   Multiple Vitamin (MULTIVITAMIN WITH MINERALS) TABS tablet Take 1 tablet by mouth 2 (two) times daily.   NON FORMULARY Diet:Mech soft/thin   sertraline (ZOLOFT) 25 MG tablet Take 25 mg by mouth daily.   Sodium Phosphates (RA SALINE ENEMA RE) If not relieved  by Biscodyl suppository, give disposable Saline Enema rectally X 1 dose/24 hrs as needed (Do not use constipation standing orders for residents with renal failure/CFR less than 30. Contact MD for orders)(Physician Or   No facility-administered encounter medications on file as of 05/23/2021.    Review of Systems  GENERAL: No change in appetite, no fatigue, no weight changes, no fever, chills or weakness MOUTH and THROAT: Denies oral discomfort, gingival pain or bleeding RESPIRATORY: no cough, SOB, DOE, wheezing,  hemoptysis CARDIAC: No chest pain, edema or palpitations GI: No abdominal pain, diarrhea, constipation, heart burn, nausea or vomiting GU: Denies dysuria, frequency, hematuria, incontinence, or discharge NEUROLOGICAL: Denies dizziness, syncope, numbness, or headache PSYCHIATRIC: Denies feelings of depression or anxiety. No report of hallucinations, insomnia, paranoia, or agitation   Immunization History  Administered Date(s) Administered   Influenza, High Dose Seasonal PF 06/29/2019   Influenza-Unspecified 07/12/2020   Moderna Sars-Covid-2 Vaccination 10/10/2019, 11/07/2019, 08/28/2020   Pneumococcal Polysaccharide-23 07/03/2011   Tdap 10/22/2012   Pertinent  Health Maintenance Due  Topic Date Due   URINE MICROALBUMIN  Never done   PNA vac Low Risk Adult (2 of 2 - PCV13) 07/02/2012   INFLUENZA VACCINE  04/22/2021   FOOT EXAM  Discontinued   HEMOGLOBIN A1C  Discontinued   OPHTHALMOLOGY EXAM  Discontinued   DEXA SCAN  Discontinued   Fall Risk  04/20/2019  Falls in the past year? (No Data)  Comment Emmi Telephone Survey: data to providers prior to load  Number falls in past yr: (No Data)  Comment Emmi Telephone Survey Actual Response =      Vitals:   05/23/21 1620  BP: (!) 101/52  Pulse: 65  Resp: 18  Temp: (!) 97.5 F (36.4 C)  Weight: 151 lb 12.8 oz (68.9 kg)  Height: 5\' 3"  (1.6 m)   Body mass index is 26.89 kg/m.  Physical Exam  GENERAL APPEARANCE: Well nourished. In no acute distress. Normal body habitus SKIN:  Skin is warm and dry.  MOUTH and THROAT: Lips are without lesions. Oral mucosa is moist and without lesions.  RESPIRATORY: Breathing is even & unlabored, BS CTAB CARDIAC: RRR, no murmur,no extra heart sounds, no edema GI: Abdomen soft, normal BS, no masses, no tenderness NEUROLOGICAL: There is no tremor. Speech is clear. Alert to self, disoriented to time and place. PSYCHIATRIC:  Affect and behavior are appropriate  Labs reviewed: Recent Labs     01/03/21 0000 04/03/21 0000  NA 140 136*  K 3.9 4.1  CL 106 100  CO2 27* 25*  BUN 15 17  CREATININE 0.6 0.6  CALCIUM 9.2 8.9   Recent Labs    01/03/21 0000  AST 22  ALT 11  ALKPHOS 103  ALBUMIN 3.4*   Recent Labs    05/29/20 0000 01/03/21 0000 04/03/21 0000 05/22/21 0000  WBC 4.9 4.2 3.1 3.8  NEUTROABS 3  --   --   --   HGB 11.8* 11.9* 11.2* 11.5*  HCT 35* 34* 31* 33*  PLT 114* 197 176 233   Lab Results  Component Value Date   TSH 2.11 11/09/2019   Lab Results  Component Value Date   HGBA1C 7.1 03/11/2021   Lab Results  Component Value Date   CHOL 141 01/03/2021   HDL 43 01/03/2021   LDLCALC 78 01/03/2021   TRIG 101 01/03/2021    Significant Diagnostic Results in last 30 days:  No results found.  Assessment/Plan  1. Elevated d-dimer -  d-dimer 2.06, no SOB -  will start on Eliquis 2.5 mg BID  2. Diabetes mellitus with peripheral vascular disease (HCC) Lab Results  Component Value Date   HGBA1C 7.1 03/11/2021   -  diet-controlled   Family/ staff Communication:  Discussed plan of care with resident and charge nurse.  Labs/tests ordered:   None  Goals of care:   Long-term care   Kenard Gower, DNP, MSN, FNP-BC [facility] [phone] (Monday-Friday 8:00 a.m. - 5:00 p.m.) [phone] (after hours)

## 2021-06-12 LAB — BASIC METABOLIC PANEL
BUN: 15 (ref 4–21)
CO2: 30 — AB (ref 13–22)
Chloride: 106 (ref 99–108)
Creatinine: 0.6 (ref 0.5–1.1)
Glucose: 225
Potassium: 4.1 (ref 3.4–5.3)
Sodium: 141 (ref 137–147)

## 2021-06-12 LAB — CBC AND DIFFERENTIAL
HCT: 34 — AB (ref 36–46)
Hemoglobin: 12 (ref 12.0–16.0)
Platelets: 232 (ref 150–399)
WBC: 4.2

## 2021-06-12 LAB — HEPATIC FUNCTION PANEL
ALT: 12 (ref 7–35)
AST: 19 (ref 13–35)
Alkaline Phosphatase: 226 — AB (ref 25–125)
Bilirubin, Total: 0.4

## 2021-06-12 LAB — CBC: RBC: 3.59 — AB (ref 3.87–5.11)

## 2021-06-12 LAB — COMPREHENSIVE METABOLIC PANEL
Albumin: 3.1 — AB (ref 3.5–5.0)
Calcium: 9.4 (ref 8.7–10.7)
GFR calc Af Amer: >90
GFR calc non Af Amer: 80.25
Globulin: 2.5

## 2021-06-21 ENCOUNTER — Non-Acute Institutional Stay (SKILLED_NURSING_FACILITY): Payer: Medicare Other | Admitting: Adult Health

## 2021-06-21 ENCOUNTER — Encounter: Payer: Self-pay | Admitting: Adult Health

## 2021-06-21 DIAGNOSIS — R7989 Other specified abnormal findings of blood chemistry: Secondary | ICD-10-CM | POA: Diagnosis not present

## 2021-06-21 DIAGNOSIS — E1169 Type 2 diabetes mellitus with other specified complication: Secondary | ICD-10-CM | POA: Diagnosis not present

## 2021-06-21 DIAGNOSIS — I10 Essential (primary) hypertension: Secondary | ICD-10-CM

## 2021-06-21 DIAGNOSIS — F0280 Dementia in other diseases classified elsewhere without behavioral disturbance: Secondary | ICD-10-CM

## 2021-06-21 DIAGNOSIS — G301 Alzheimer's disease with late onset: Secondary | ICD-10-CM

## 2021-06-21 DIAGNOSIS — E034 Atrophy of thyroid (acquired): Secondary | ICD-10-CM

## 2021-06-21 NOTE — Progress Notes (Signed)
Location:  [facility] Room Number: 125-A Place of Service:  SNF (31) Provider:  Kenard Gower, DNP, FNP-BC  Patient Care Team: Pecola Lawless, MD as PCP - General (Internal Medicine) Medina-Vargas, Margit Banda, NP as Nurse Practitioner (Internal Medicine)  Extended Emergency Contact Information Primary Emergency Contact: [name],[name] Address: [address]          [place], [place] [address] D[place] of [place] Home Phone: [phone] Mobile Phone: [phone] Relation: Daughter  Code Status: DNR   Goals of care: Advanced Directive information Advanced Directives 06/21/2021  Does Patient Have a Medical Advance Directive? Yes  Type of Advance Directive Out of facility DNR (pink MOST or yellow form)  Does patient want to make changes to medical advance directive? No - Patient declined  Copy of Healthcare Power of Attorney in Chart? -  Would patient like information on creating a medical advance directive? -  Pre-existing out of facility DNR order (yellow form or pink MOST form) Yellow form placed in chart (order not valid for inpatient use)     Chief Complaint  Patient presents with   Medical Management of Chronic Issues    Routine Visit    HPI:  Pt is a 87 y.o. female seen today for medical management of chronic diseases.  She is a long-term care resident of [facility]. She has a PMH of Alzheimer's disease, CVA, depression, hypertension, diabetes mellitus and hypothyroidism. SBPs ranging from 113-128, with outlier 101and  104. She is not on any medication for hypertension.   Past Medical History:  Diagnosis Date   Cerebrovascular disease    Dementia (HCC)    Depression    DM type 2 (diabetes mellitus, type 2) (HCC) 07/02/2011   Elevated LFTs    Frequent falls    HTN (hypertension) 07/02/2011   Thrombocytopenia (HCC)    UTI (lower urinary tract infection)    Past Surgical History:  Procedure Laterality Date    ABDOMINAL HYSTERECTOMY     CHOLECYSTECTOMY      No Known Allergies  Outpatient Encounter Medications as of 06/21/2021  Medication Sig   acetaminophen (TYLENOL) 325 MG tablet Take 650 mg by mouth every 6 (six) hours as needed.   apixaban (ELIQUIS) 2.5 MG TABS tablet Take 2.5 mg by mouth 2 (two) times daily. FOR ELEVATED D-DIMER   aspirin EC 81 MG tablet Take 81 mg by mouth daily.   bisacodyl (DULCOLAX) 10 MG suppository If not relieved by MOM, give 10 mg Bisacodyl suppositiory rectally X 1 dose in 24 hours as needed (Do not use constipation standing orders for residents with renal failure/CFR less than 30. Contact MD for orders) (Physician Order)   Ensure (ENSURE) Take by mouth daily.   guaiFENesin (ROBITUSSIN) 100 MG/5ML SOLN Give 10 mL by mouth three times daily x 10 days dx: cough.   levothyroxine (SYNTHROID) 50 MCG tablet Take 50 mcg by mouth daily before breakfast.   magnesium hydroxide (MILK OF MAGNESIA) 400 MG/5ML suspension If no BM in 3 days, give 30 cc Milk of Magnesium p.o. x 1 dose in 24 hours as needed (Do not use standing constipation orders for residents with renal failure CFR less than 30. Contact MD for orders) (Physician Order)   Multiple Vitamin (MULTIVITAMIN WITH MINERALS) TABS tablet Take 1 tablet by mouth 2 (two) times daily.   NON FORMULARY Diet:MECHANICAL SOFT, THIN LIQUIDS   sertraline (ZOLOFT) 25 MG tablet Take 25 mg by mouth daily.   Sodium Phosphates (RA SALINE  ENEMA RE) If not relieved by Biscodyl suppository, give disposable Saline Enema rectally X 1 dose/24 hrs as needed (Do not use constipation standing orders for residents with renal failure/CFR less than 30. Contact MD for orders)(Physician Or   [DISCONTINUED] mirtazapine (REMERON) 15 MG tablet Take 15 mg by mouth at bedtime.   No facility-administered encounter medications on file as of 06/21/2021.    Review of Systems  Unable to obtain due to dementia    Immunization History  Administered Date(s)  Administered   Influenza, High Dose Seasonal PF 06/29/2019   Influenza-Unspecified 07/12/2020   Moderna Sars-Covid-2 Vaccination 10/10/2019, 11/07/2019, 08/28/2020   Pneumococcal Polysaccharide-23 07/03/2011   Tdap 10/22/2012   Pertinent  Health Maintenance Due  Topic Date Due   URINE MICROALBUMIN  Never done   INFLUENZA VACCINE  04/22/2021   FOOT EXAM  Discontinued   HEMOGLOBIN A1C  Discontinued   OPHTHALMOLOGY EXAM  Discontinued   DEXA SCAN  Discontinued   Fall Risk  04/20/2019  Falls in the past year? (No Data)  Comment Emmi Telephone Survey: data to providers prior to load  Number falls in past yr: (No Data)  Comment Emmi Telephone Survey Actual Response =      Vitals:   06/21/21 1049  BP: 113/66  Pulse: 71  Resp: 18  Temp: (!) 97.5 F (36.4 C)  Weight: 151 lb 3.2 oz (68.6 kg)  Height: 5\' 3"  (1.6 m)   Body mass index is 26.78 kg/m.  Physical Exam  GENERAL APPEARANCE: Well nourished. In no acute distress. Normal body habitus SKIN:  Skin is warm and dry.  MOUTH and THROAT: Lips are without lesions. Oral mucosa is moist and without lesions.  RESPIRATORY: Breathing is even & unlabored, BS CTAB CARDIAC: RRR, no murmur,no extra heart sounds, no edema GI: Abdomen soft, normal BS, no masses, no tenderness NEUROLOGICAL: There is no tremor. Speech is clear. Alert to self, disoriented to time and place. PSYCHIATRIC:  Affect and behavior are appropriate  Labs reviewed: Recent Labs    01/03/21 0000 04/03/21 0000 06/12/21 0000  NA 140 136* 141  K 3.9 4.1 4.1  CL 106 100 106  CO2 27* 25* 30*  BUN 15 17 15   CREATININE 0.6 0.6 0.6  CALCIUM 9.2 8.9 9.4   Recent Labs    01/03/21 0000 06/12/21 0000  AST 22 19  ALT 11 12  ALKPHOS 103 226*  ALBUMIN 3.4* 3.1*   Recent Labs    04/03/21 0000 05/22/21 0000 06/12/21 0000  WBC 3.1 3.8 4.2  HGB 11.2* 11.5* 12.0  HCT 31* 33* 34*  PLT 176 233 232   Lab Results  Component Value Date   TSH 2.11 11/09/2019   Lab  Results  Component Value Date   HGBA1C 7.1 03/11/2021   Lab Results  Component Value Date   CHOL 141 01/03/2021   HDL 43 01/03/2021   LDLCALC 78 01/03/2021   TRIG 101 01/03/2021    Significant Diagnostic Results in last 30 days:  No results found.  Assessment/Plan  1. Essential hypertension -   BPs stable, not taking any antihypertensive medication  2. Hypothyroidism due to acquired atrophy of thyroid Lab Results  Component Value Date   TSH 2.11 11/09/2019   -   Continue levothyroxine  3. Elevated d-dimer -  d-dimer 2.21, elevated -  no SOB, continue Eliquis  4. Type 2 diabetes mellitus with other specified complication, without long-term current use of insulin [facility]) Lab Results  Component Value Date  HGBA1C 7.1 03/11/2021   - diet-controlled  5. Late onset Alzheimer's dementia without behavioral disturbance, psychotic disturbance, mood disturbance, or anxiety, unspecified dementia severity (HCC) -  BIMS score 4/15, ranging in severe cognitive impairment -Continue supportive care     Family/ staff Communication: Discussed plan of care with charge nurse.  Labs/tests ordered:   A1c 8 TSH  Goals of care:   Long-term care    Kenard Gower, DNP, MSN, FNP-BC [facility] [phone] (Monday-Friday 8:00 a.m. - 5:00 p.m.) [phone] (after hours)

## 2021-07-01 LAB — HEMOGLOBIN A1C: Hemoglobin A1C: 7.8

## 2021-07-12 ENCOUNTER — Non-Acute Institutional Stay (SKILLED_NURSING_FACILITY): Payer: Medicare Other | Admitting: Adult Health

## 2021-07-12 ENCOUNTER — Encounter: Payer: Self-pay | Admitting: Adult Health

## 2021-07-12 DIAGNOSIS — R7989 Other specified abnormal findings of blood chemistry: Secondary | ICD-10-CM

## 2021-07-12 DIAGNOSIS — F0280 Dementia in other diseases classified elsewhere without behavioral disturbance: Secondary | ICD-10-CM

## 2021-07-12 DIAGNOSIS — E1151 Type 2 diabetes mellitus with diabetic peripheral angiopathy without gangrene: Secondary | ICD-10-CM | POA: Diagnosis not present

## 2021-07-12 DIAGNOSIS — E034 Atrophy of thyroid (acquired): Secondary | ICD-10-CM | POA: Diagnosis not present

## 2021-07-12 DIAGNOSIS — F339 Major depressive disorder, recurrent, unspecified: Secondary | ICD-10-CM | POA: Diagnosis not present

## 2021-07-12 DIAGNOSIS — G301 Alzheimer's disease with late onset: Secondary | ICD-10-CM

## 2021-07-12 NOTE — Progress Notes (Signed)
Location:  [facility] Room Number: 125 A Place of Service:  SNF (31) Provider:  Kenard Gower, DNP, FNP-BC  Patient Care Team: Pecola Lawless, MD as PCP - General (Internal Medicine) Medina-Vargas, Margit Banda, NP as Nurse Practitioner (Internal Medicine)  Extended Emergency Contact Information Primary Emergency Contact: [name],[name] Address: [address]          [place], [place] [address] Darden Amber of [place] Home Phone: [phone] Mobile Phone: [phone] Relation: Daughter  Code Status:  DNR  Goals of care: Advanced Directive information Advanced Directives 07/17/2021  Does Patient Have a Medical Advance Directive? Yes  Type of Estate agent of [place];Out of facility DNR (pink MOST or yellow form)  Does patient want to make changes to medical advance directive? No - Patient declined  Copy of Healthcare Power of Attorney in Chart? Yes - validated most recent copy scanned in chart (See row information)  Would patient like information on creating a medical advance directive? -  Pre-existing out of facility DNR order (yellow form or pink MOST form) Yellow form placed in chart (order not valid for inpatient use)     Chief Complaint  Patient presents with   Medical Management of Chronic Issues    Routine follow up visit    HPI:  Pt is a 87 y.o. female seen today for medical management of chronic diseases. She is a long-term care resident of [facility].  She has a PMH of Alzheimer's disease, CVA, depression, hypertension, diabetes mellitus and hypothyroidism. She does not take any medications for diabetes mellitus. Latest A1c 7.8, 06/29/21. She takes  Levothyroxine 50 mcg daily for hypothyroidism. Latest tsh 3.64, 01/03/21. No reported SOB. She takes  Eliquis 2.5 mg BID for elevated D-dimer. No shortness of breath.   Past Medical History:  Diagnosis Date   Cerebrovascular disease    Dementia  (HCC)    Depression    DM type 2 (diabetes mellitus, type 2) (HCC) 07/02/2011   Elevated LFTs    Frequent falls    HTN (hypertension) 07/02/2011   Thrombocytopenia (HCC)    UTI (lower urinary tract infection)    Past Surgical History:  Procedure Laterality Date   ABDOMINAL HYSTERECTOMY     CHOLECYSTECTOMY      No Known Allergies  Outpatient Encounter Medications as of 07/12/2021  Medication Sig   acetaminophen (TYLENOL) 325 MG tablet Take 650 mg by mouth every 6 (six) hours as needed.   apixaban (ELIQUIS) 2.5 MG TABS tablet Take 2.5 mg by mouth 2 (two) times daily. FOR ELEVATED D-DIMER   aspirin EC 81 MG tablet Take 81 mg by mouth daily.   bisacodyl (DULCOLAX) 10 MG suppository If not relieved by MOM, give 10 mg Bisacodyl suppositiory rectally X 1 dose in 24 hours as needed (Do not use constipation standing orders for residents with renal failure/CFR less than 30. Contact MD for orders) (Physician Order)   Ensure (ENSURE) Take by mouth daily.   levothyroxine (SYNTHROID) 50 MCG tablet Take 50 mcg by mouth daily before breakfast.   magnesium hydroxide (MILK OF MAGNESIA) 400 MG/5ML suspension If no BM in 3 days, give 30 cc Milk of Magnesium p.o. x 1 dose in 24 hours as needed (Do not use standing constipation orders for residents with renal failure CFR less than 30. Contact MD for orders) (Physician Order)   Multiple Vitamin (MULTIVITAMIN WITH MINERALS) TABS tablet Take 1 tablet by mouth 2 (two) times daily.   NON FORMULARY  Diet:MECHANICAL SOFT, THIN LIQUIDS   Nutritional Supplements (NUTRITIONAL SUPPLEMENT PO) Take by mouth. Magic cup   nystatin ointment (MYCOSTATIN) Apply 1 application topically 2 (two) times daily.   sertraline (ZOLOFT) 25 MG tablet Take 25 mg by mouth daily.   Sodium Phosphates (RA SALINE ENEMA RE) If not relieved by Biscodyl suppository, give disposable Saline Enema rectally X 1 dose/24 hrs as needed (Do not use constipation standing orders for residents with renal  failure/CFR less than 30. Contact MD for orders)(Physician Or   [DISCONTINUED] guaiFENesin (ROBITUSSIN) 100 MG/5ML SOLN Give 10 mL by mouth three times daily x 10 days dx: cough.   No facility-administered encounter medications on file as of 07/12/2021.    Review of Systems  Unable to obtain due to dementia   Immunization History  Administered Date(s) Administered   Influenza, High Dose Seasonal PF 06/29/2019   Influenza-Unspecified 07/12/2020   Moderna Sars-Covid-2 Vaccination 10/10/2019, 11/07/2019, 08/28/2020   Pneumococcal Polysaccharide-23 07/03/2011   Tdap 10/22/2012   Pertinent  Health Maintenance Due  Topic Date Due   URINE MICROALBUMIN  Never done   INFLUENZA VACCINE  04/22/2021   OPHTHALMOLOGY EXAM  07/20/2021 (Originally 01/05/1942)   FOOT EXAM  Discontinued   HEMOGLOBIN A1C  Discontinued   DEXA SCAN  Discontinued   Fall Risk  04/20/2019  Falls in the past year? (No Data)  Comment Emmi Telephone Survey: data to providers prior to load  Number falls in past yr: (No Data)  Comment Emmi Telephone Survey Actual Response =      Vitals:   07/12/21 1440  BP: 122/65  Pulse: 67  Resp: 18  Temp: (!) 97.3 F (36.3 C)  Height: 5\' 3"  (1.6 m)   Body mass index is 26.78 kg/m.  Physical Exam  GENERAL APPEARANCE: Well nourished. In no acute distress. Normal body habitus SKIN:  Skin is warm and dry.  MOUTH and THROAT: Lips are without lesions. Oral mucosa is moist and without lesions. Tongue is normal in shape, size, and color and without lesions RESPIRATORY: Breathing is even & unlabored, BS CTAB CARDIAC: RRR, no murmur,no extra heart sounds, no edema GI: Abdomen soft, normal BS, no masses, no tenderness NEUROLOGICAL: There is no tremor. Speech is clear. Alert to self, disoriented to time and place. PSYCHIATRIC:  Affect and behavior are appropriate  Labs reviewed: Recent Labs    01/03/21 0000 04/03/21 0000 06/12/21 0000  NA 140 136* 141  K 3.9 4.1 4.1  CL 106  100 106  CO2 27* 25* 30*  BUN 15 17 15   CREATININE 0.6 0.6 0.6  CALCIUM 9.2 8.9 9.4   Recent Labs    01/03/21 0000 06/12/21 0000  AST 22 19  ALT 11 12  ALKPHOS 103 226*  ALBUMIN 3.4* 3.1*   Recent Labs    04/03/21 0000 05/22/21 0000 06/12/21 0000  WBC 3.1 3.8 4.2  HGB 11.2* 11.5* 12.0  HCT 31* 33* 34*  PLT 176 233 232   Lab Results  Component Value Date   TSH 2.11 11/09/2019   Lab Results  Component Value Date   HGBA1C 7.1 03/11/2021   Lab Results  Component Value Date   CHOL 141 01/03/2021   HDL 43 01/03/2021   LDLCALC 78 01/03/2021   TRIG 101 01/03/2021    Significant Diagnostic Results in last 30 days:  No results found.  Assessment/Plan  1. Diabetes mellitus with peripheral vascular disease (HCC) -  A1C 7.8, 06/29/21 -  diet-controlled  2. Hypothyroidism due to acquired  atrophy of thyroid -  tsh 3.64,  01/03/21  3. Major depression, recurrent, chronic (HCC) - PHQ-9 score 0, no depressed mood -  continue Sertraline  4. Elevated d-dimer -   D-dimer 2.21, elevated  06/07/21, no SOB -  continue Eliquis  5. Late onset Alzheimer's dementia without behavioral disturbance, psychotic disturbance, mood disturbance, or anxiety, unspecified dementia severity (HCC) -  BIMS score 4/15, ranging in severe cognitive impairment -  continue supportive care     Family/ staff Communication:  Discussed plan of care with resident and charge nurse.  Labs/tests ordered:  None  Goals of care:   Long-term care   Kenard Gower, DNP, MSN, FNP-BC [facility] [phone] (Monday-Friday 8:00 a.m. - 5:00 p.m.) [phone] (after hours)

## 2021-07-17 ENCOUNTER — Encounter: Payer: Self-pay | Admitting: Adult Health

## 2021-07-17 ENCOUNTER — Non-Acute Institutional Stay (SKILLED_NURSING_FACILITY): Payer: Medicare Other | Admitting: Adult Health

## 2021-07-17 DIAGNOSIS — E034 Atrophy of thyroid (acquired): Secondary | ICD-10-CM | POA: Diagnosis not present

## 2021-07-17 DIAGNOSIS — R7989 Other specified abnormal findings of blood chemistry: Secondary | ICD-10-CM

## 2021-07-17 DIAGNOSIS — F339 Major depressive disorder, recurrent, unspecified: Secondary | ICD-10-CM | POA: Diagnosis not present

## 2021-07-17 DIAGNOSIS — Z7189 Other specified counseling: Secondary | ICD-10-CM | POA: Diagnosis not present

## 2021-07-17 DIAGNOSIS — G301 Alzheimer's disease with late onset: Secondary | ICD-10-CM

## 2021-07-17 DIAGNOSIS — F0280 Dementia in other diseases classified elsewhere without behavioral disturbance: Secondary | ICD-10-CM

## 2021-07-17 DIAGNOSIS — Z66 Do not resuscitate: Secondary | ICD-10-CM

## 2021-07-17 NOTE — Progress Notes (Signed)
Location:  [facility] Room Number: 125-A Place of Service:  SNF (31) Provider:  Kenard Gower, DNP, FNP-BC  Patient Care Team: Pecola Lawless, MD as PCP - General (Internal Medicine) Medina-Vargas, Margit Banda, NP as Nurse Practitioner (Internal Medicine)  Extended Emergency Contact Information Primary Emergency Contact: [name],[name] Address: [address]          [place], [place] [address] Darden [place] of [place] Home Phone: [phone] Mobile Phone: [phone] Relation: Daughter  Code Status:  DNR  Goals of care: Advanced Directive information Advanced Directives 07/17/2021  Does Patient Have a Medical Advance Directive? Yes  Type of Estate agent of [place];Out of facility DNR (pink MOST or yellow form)  Does patient want to make changes to medical advance directive? No - Patient declined  Copy of Healthcare Power of Attorney in Chart? Yes - validated most recent copy scanned in chart (See row information)  Would patient like information on creating a medical advance directive? -  Pre-existing out of facility DNR order (yellow form or pink MOST form) Yellow form placed in chart (order not valid for inpatient use)     Chief Complaint  Patient presents with   Advanced Directive    Advance care planning     HPI:  Pt is a 87 y.o. female who had a care plan meeting today attended by social worker, NP and [name], daughter, attended via telephone conference. She remains to be DNR. Discussed medications, vital signs and weights. Daughter stated that she has noticed a decline in her memory. Daughter said that resident does not recognize her now.  And thinks that she is her friend "Dot". Latest BIMS score 4/15, ranging in severe cognitive impairment. She is encouraged to attend recreational activities at the facility. Daughter gave verbal consent for new psych team to treat resident. The meeting lasted for 20 minutes    Past  Medical History:  Diagnosis Date   Cerebrovascular disease    Dementia (HCC)    Depression    DM type 2 (diabetes mellitus, type 2) (HCC) 07/02/2011   Elevated LFTs    Frequent falls    HTN (hypertension) 07/02/2011   Thrombocytopenia (HCC)    UTI (lower urinary tract infection)    Past Surgical History:  Procedure Laterality Date   ABDOMINAL HYSTERECTOMY     CHOLECYSTECTOMY      No Known Allergies  Outpatient Encounter Medications as of 07/17/2021  Medication Sig   acetaminophen (TYLENOL) 325 MG tablet Take 650 mg by mouth every 6 (six) hours as needed.   apixaban (ELIQUIS) 2.5 MG TABS tablet Take 2.5 mg by mouth 2 (two) times daily. FOR ELEVATED D-DIMER   aspirin EC 81 MG tablet Take 81 mg by mouth daily.   bisacodyl (DULCOLAX) 10 MG suppository If not relieved by MOM, give 10 mg Bisacodyl suppositiory rectally X 1 dose in 24 hours as needed (Do not use constipation standing orders for residents with renal failure/CFR less than 30. Contact MD for orders) (Physician Order)   Ensure (ENSURE) Take by mouth daily.   levothyroxine (SYNTHROID) 50 MCG tablet Take 50 mcg by mouth daily before breakfast.   magnesium hydroxide (MILK OF MAGNESIA) 400 MG/5ML suspension If no BM in 3 days, give 30 cc Milk of Magnesium p.o. x 1 dose in 24 hours as needed (Do not use standing constipation orders for residents with renal failure CFR less than 30. Contact MD for orders) (Physician Order)   Multiple Vitamin (MULTIVITAMIN WITH MINERALS)  TABS tablet Take 1 tablet by mouth 2 (two) times daily.   NON FORMULARY Diet:MECHANICAL SOFT, THIN LIQUIDS   Nutritional Supplements (NUTRITIONAL SUPPLEMENT PO) Take by mouth. Magic cup   nystatin ointment (MYCOSTATIN) Apply 1 application topically 2 (two) times daily.   sertraline (ZOLOFT) 25 MG tablet Take 25 mg by mouth daily.   Sodium Phosphates (RA SALINE ENEMA RE) If not relieved by Biscodyl suppository, give disposable Saline Enema rectally X 1 dose/24 hrs as  needed (Do not use constipation standing orders for residents with renal failure/CFR less than 30. Contact MD for orders)(Physician Or   No facility-administered encounter medications on file as of 07/17/2021.    Review of Systems  Unable to obtain due to dementia.  I  Immunization History  Administered Date(s) Administered   Influenza, High Dose Seasonal PF 06/29/2019   Influenza-Unspecified 07/12/2020   Moderna Sars-Covid-2 Vaccination 10/10/2019, 11/07/2019, 08/28/2020   Pneumococcal Polysaccharide-23 07/03/2011   Tdap 10/22/2012   Pertinent  Health Maintenance Due  Topic Date Due   URINE MICROALBUMIN  Never done   INFLUENZA VACCINE  04/22/2021   FOOT EXAM  Discontinued   HEMOGLOBIN A1C  Discontinued   OPHTHALMOLOGY EXAM  Discontinued   DEXA SCAN  Discontinued   Fall Risk 04/20/2019 07/11/2019 07/11/2019 07/12/2019 07/12/2019  Falls in the past year? (No Data) - - - -  Number of falls in past year Emmi Telephone Survey Actual Response =  - - - -  Patient Fall Risk Level - High fall risk High fall risk High fall risk High fall risk     Vitals:   07/17/21 1018  BP: 127/64  Pulse: (!) 59  Resp: 18  Temp: (!) 97.3 F (36.3 C)  Weight: 153 lb 9.6 oz (69.7 kg)  Height: 5\' 3"  (1.6 m)   Body mass index is 27.21 kg/m.  Physical Exam  GENERAL APPEARANCE: Well nourished. In no acute distress.  SKIN:  Skin is warm and dry.  MOUTH and THROAT: Lips are without lesions. Oral mucosa is moist and without lesions.  RESPIRATORY: Breathing is even & unlabored, BS CTAB CARDIAC: RRR, no murmur,no extra heart sounds, no edema GI: Abdomen soft, normal BS, no masses, no tenderness NEUROLOGICAL: There is no tremor. Speech is clear. Alert to self, disoriented to time and place. PSYCHIATRIC:  Affect and behavior are appropriate  Labs reviewed: Recent Labs    01/03/21 0000 04/03/21 0000 06/12/21 0000  NA 140 136* 141  K 3.9 4.1 4.1  CL 106 100 106  CO2 27* 25* 30*  BUN 15 17  15   CREATININE 0.6 0.6 0.6  CALCIUM 9.2 8.9 9.4   Recent Labs    01/03/21 0000 06/12/21 0000  AST 22 19  ALT 11 12  ALKPHOS 103 226*  ALBUMIN 3.4* 3.1*   Recent Labs    04/03/21 0000 05/22/21 0000 06/12/21 0000  WBC 3.1 3.8 4.2  HGB 11.2* 11.5* 12.0  HCT 31* 33* 34*  PLT 176 233 232   Lab Results  Component Value Date   TSH 2.11 11/09/2019   Lab Results  Component Value Date   HGBA1C 7.1 03/11/2021   Lab Results  Component Value Date   CHOL 141 01/03/2021   HDL 43 01/03/2021   LDLCALC 78 01/03/2021   TRIG 101 01/03/2021    Significant Diagnostic Results in last 30 days:  No results found.  Assessment/Plan  1. Advance care planning -   remains to be DNR -  discussed medications, vital signs  and weights  2. Elevated d-dimer -  d-dimer 2.21, elevated, no SOB -   will continue Eliquis  3. Hypothyroidism due to acquired atrophy of thyroid Lab Results  Component Value Date   TSH 2.11 11/09/2019   -  continue Levothyroxine  4. Major depression, recurrent, chronic (HCC) -   latest PHQ-9 is 0, no depressed mood -   continue Sertraline  5. Late onset Alzheimer's dementia without behavioral disturbance, psychotic disturbance, mood disturbance, or anxiety, unspecified dementia severity (HCC) -  BIMS 4/15, ranging in severe cognitive impairment -  continue supportive care  6. DNR (do not resuscitate) - Do not attempt resuscitation (DNR)     Family/ staff Communication:   Discussed plan of care with IDT and daughter.  Labs/tests ordered:  d-dimer and tsh  Goals of care:   Long-term care   Kenard Gower, DNP, MSN, FNP-BC [facility] [phone] (Monday-Friday 8:00 a.m. - 5:00 p.m.) [phone] (after hours)

## 2021-07-24 LAB — TSH: TSH: 3.2 (ref <=5.90)

## 2021-08-13 ENCOUNTER — Encounter: Payer: Self-pay | Admitting: Internal Medicine

## 2021-08-13 ENCOUNTER — Non-Acute Institutional Stay (SKILLED_NURSING_FACILITY): Payer: Medicare Other | Admitting: Internal Medicine

## 2021-08-13 DIAGNOSIS — D649 Anemia, unspecified: Secondary | ICD-10-CM

## 2021-08-13 DIAGNOSIS — I10 Essential (primary) hypertension: Secondary | ICD-10-CM

## 2021-08-13 DIAGNOSIS — E1151 Type 2 diabetes mellitus with diabetic peripheral angiopathy without gangrene: Secondary | ICD-10-CM | POA: Diagnosis not present

## 2021-08-13 DIAGNOSIS — R627 Adult failure to thrive: Secondary | ICD-10-CM

## 2021-08-13 DIAGNOSIS — N182 Chronic kidney disease, stage 2 (mild): Secondary | ICD-10-CM | POA: Insufficient documentation

## 2021-08-13 NOTE — Assessment & Plan Note (Signed)
Anemia improved with current H/H of 12/34.

## 2021-08-13 NOTE — Assessment & Plan Note (Signed)
Current albumin is 3.1.  Total protein is not current.  She does have limb atrophy and protein/caloric malnutrition suggested.  Staff reports poor oral intake of anything beyond sweets.

## 2021-08-13 NOTE — Assessment & Plan Note (Signed)
BP controlled; no change in antihypertensive medications  

## 2021-08-13 NOTE — Assessment & Plan Note (Addendum)
The most recent A1c suggested overall excellent control with a value of 7.1%.  This was in June and update is indicated as p.o. intake of nutritional foods is limited as she prefers sweets.

## 2021-08-13 NOTE — Assessment & Plan Note (Addendum)
Creatinine 0.6 which may reflect muscle mass deficiency.  GFR exhibits CKD stage II with a value of 80.25.  No indication for change in  medications or dosages.

## 2021-08-13 NOTE — Progress Notes (Signed)
   NURSING HOME LOCATION:  [name] ROOM NUMBER:  125 A  CODE STATUS:  DNR  PCP:  Douglass Rivers MD  This is a nursing facility follow up visit of chronic medical diagnoses & to document compliance with Regulation 483.30 (c) in The Long Term Care Survey Manual Phase 2 which mandates caregiver visit ( visits can alternate among physician, PA or NP as per statutes) within 10 days of 30 days / 60 days/ 90 days post admission to SNF date    Interim medical record and care since last SNF visit was updated with review of diagnostic studies and change in clinical status since last visit were documented.  HPI: She is a permanent resident of facility with diagnoses of diabetes with neurovascular complications, essential hypertension, history of thrombocytopenia, and vascular dementia.  Most recent labs 06/12/2021 revealed creatinine of 0.6 with GFR of 80.25 indicating high stage II CKD.  Albumin was 3.1.  There has been improvement in her anemia serially with H/H of 12/34.  Glucoses have varied from 195 up to 396 but A1c on 02/2021 revealed excellent control with a value of 7.1%.  Review of systems: Her dementia prevented completion of review of systems.  In fact she did not communicate verbally or follow commands. I had seen her in the TV room earlier in the day.  She had eaten and was screaming to go back to bed.  Staff states that she wants to spend her entire time in bed.  They also state that she eats poorly, focusing only on cookies or sweets included with the meals. They confirm that she does scream a lot; but she is not physically abusive.  Physical exam: Pertinent or positive findings: It was after 4 PM and she had been placed back in bed.  She had the sheets and covers over her head.  Even after pull down the linens; she did not wake up.  She kept her eyes closed throughout the exam.  Hair is fine and disheveled.  Mouth is edentulous and sunken.  Breath sounds are  decreased . The abdomen is soft.  She is wearing paper diapers.  Pedal pulses are decreased.  As noted she did not follow commands.  General appearance: Adequately nourished; no acute distress, increased work of breathing is present.   Lymphatic: No lymphadenopathy about the head, neck, axilla. Eyes: No conjunctival inflammation or lid edema is present. There is no scleral icterus. Ears:  External ear exam shows no significant lesions or deformities.   Nose:  External nasal examination shows no deformity or inflammation. Nasal mucosa are pink and moist without lesions, exudates Oral exam:  Lips and gums are healthy appearing. There is no oropharyngeal erythema or exudate. Neck:  No thyromegaly, masses, tenderness noted.    Heart:  Normal rate and regular rhythm. S1 and S2 normal without gallop, murmur, click, rub .  Lungs:  without wheezes, rhonchi, rales, rubs. Abdomen: Bowel sounds are normal. Abdomen is soft and nontender with no organomegaly, hernias, masses. GU: Deferred  Extremities:  No cyanosis, clubbing, edema  Neurologic exam :Balance, Rhomberg, finger to nose testing could not be completed due to clinical state Skin: Warm & dry w/o tenting. No significant lesions or rash.  See summary under each active problem in the Problem List with associated updated therapeutic plan

## 2021-08-15 NOTE — Patient Instructions (Signed)
See assessment and plan under each diagnosis in the problem list and acutely for this visit 

## 2021-09-13 ENCOUNTER — Encounter: Payer: Self-pay | Admitting: Adult Health

## 2021-09-13 ENCOUNTER — Non-Acute Institutional Stay (SKILLED_NURSING_FACILITY): Payer: Medicare Other | Admitting: Adult Health

## 2021-09-13 DIAGNOSIS — R7989 Other specified abnormal findings of blood chemistry: Secondary | ICD-10-CM

## 2021-09-13 DIAGNOSIS — E034 Atrophy of thyroid (acquired): Secondary | ICD-10-CM

## 2021-09-13 DIAGNOSIS — B372 Candidiasis of skin and nail: Secondary | ICD-10-CM

## 2021-09-13 DIAGNOSIS — F339 Major depressive disorder, recurrent, unspecified: Secondary | ICD-10-CM

## 2021-09-13 DIAGNOSIS — G301 Alzheimer's disease with late onset: Secondary | ICD-10-CM

## 2021-09-13 DIAGNOSIS — F0280 Dementia in other diseases classified elsewhere without behavioral disturbance: Secondary | ICD-10-CM

## 2021-09-13 NOTE — Progress Notes (Signed)
Location:  [facility] Room Number: 125-A Place of Service:  SNF (31) Provider:  Durenda Age, DNP, FNP-BC  Patient Care Team: Hendricks Limes, MD as PCP - General (Internal Medicine) Medina-Vargas, Senaida Lange, NP as Nurse Practitioner (Internal Medicine)  Extended Emergency Contact Information Primary Emergency Contact: [name],[name] Address: [address]          [place], [place] [address] Johnnette Litter of [place] Phone: [phone] Mobile Phone: [phone] Relation: Daughter  Code Status: DNR   Goals of care: Advanced Directive information Advanced Directives 09/13/2021  Does Patient Have a Medical Advance Directive? Yes  Type of Advance Directive Out of facility DNR (pink MOST or yellow form);Healthcare Power of Attorney  Does patient want to make changes to medical advance directive? No - Patient declined  Copy of [place] in Chart? Yes - validated most recent copy scanned in chart (See row information)  Would patient like information on creating a medical advance directive? -  Pre-existing out of facility DNR order (yellow form or pink MOST form) Yellow form placed in chart (order not valid for inpatient use)     Chief Complaint  Patient presents with   Medical Management of Chronic Issues    Routine Visit    HPI:  Pt is a 87 y.o. female seen today for medical management of chronic diseases. He is a long-term care resident of [facility]. He has a PMH of Alzheimer's disease, CVA, depression, hypertension, diabetes mellitus and hypothyroidism. Noted to have erythematous rashes on left axilla.  No reported SOB.  Latest D-dimer 2.56, elevated.  She takes Eliquis 2.5 mg twice a day. BIMS score 4/15, ranging in severe cognitive impairment.   Past Medical History:  Diagnosis Date   Cerebrovascular disease    Dementia ([place])    Depression    DM type 2 (diabetes mellitus, type 2) ([place]) 07/02/2011    Elevated LFTs    Frequent falls    HTN (hypertension) 07/02/2011   Thrombocytopenia ([place])    UTI (lower urinary tract infection)    Past Surgical History:  Procedure Laterality Date   ABDOMINAL HYSTERECTOMY     CHOLECYSTECTOMY      No Known Allergies  Outpatient Encounter Medications as of 09/13/2021  Medication Sig   acetaminophen (TYLENOL) 325 MG tablet Take 650 mg by mouth every 6 (six) hours as needed.   apixaban (ELIQUIS) 2.5 MG TABS tablet Take 2.5 mg by mouth 2 (two) times daily. FOR ELEVATED D-DIMER   aspirin EC 81 MG tablet Take 81 mg by mouth daily.   bisacodyl (DULCOLAX) 10 MG suppository If not relieved by MOM, give 10 mg Bisacodyl suppositiory rectally X 1 dose in 24 hours as needed (Do not use constipation standing orders for residents with renal failure/CFR less than 30. Contact MD for orders) (Physician Order)   Ensure (ENSURE) Take by mouth daily.   levothyroxine (SYNTHROID) 50 MCG tablet Take 50 mcg by mouth daily before breakfast.   magnesium hydroxide (MILK OF MAGNESIA) 400 MG/5ML suspension If no BM in 3 days, give 30 cc Milk of Magnesium p.o. x 1 dose in 24 hours as needed (Do not use standing constipation orders for residents with renal failure CFR less than 30. Contact MD for orders) (Physician Order)   Multiple Vitamin (MULTIVITAMIN WITH MINERALS) TABS tablet Take 1 tablet by mouth 2 (two) times daily.   NON FORMULARY Diet:MECHANICAL SOFT, THIN LIQUIDS   Nutritional Supplements (NUTRITIONAL SUPPLEMENT PO) Take by  mouth. Magic cup   nystatin ointment (MYCOSTATIN) Apply 1 application topically 2 (two) times daily.   sertraline (ZOLOFT) 25 MG tablet Take 25 mg by mouth daily.   Sodium Phosphates (RA SALINE ENEMA RE) If not relieved by Biscodyl suppository, give disposable Saline Enema rectally X 1 dose/24 hrs as needed (Do not use constipation standing orders for residents with renal failure/CFR less than 30. Contact MD for orders)(Physician Or   No  facility-administered encounter medications on file as of 09/13/2021.    Review of Systems  GENERAL: No change in appetite, no fatigue, no weight changes, no fever, chills  MOUTH and THROAT: Denies oral discomfort, gingival pain or bleeding, pain from teeth or hoarseness   RESPIRATORY: no cough, SOB, DOE, wheezing, hemoptysis CARDIAC: No chest pain, edema or palpitations GI: No abdominal pain, diarrhea, constipation, heart burn, nausea or vomiting GU: Denies dysuria, frequency, hematuria or discharge NEUROLOGICAL: Denies dizziness, syncope, numbness, or headache PSYCHIATRIC: Denies feelings of depression or anxiety. No report of hallucinations, insomnia, paranoia, or agitation   Immunization History  Administered Date(s) Administered   Influenza, High Dose Seasonal PF 06/29/2019   Influenza-Unspecified 07/12/2020   Moderna Sars-Covid-2 Vaccination 10/10/2019, 11/07/2019, 08/28/2020   Pneumococcal Polysaccharide-23 07/03/2011   Tdap 10/22/2012   Pertinent  Health Maintenance Due  Topic Date Due   OPHTHALMOLOGY EXAM  Never done   URINE MICROALBUMIN  Never done   INFLUENZA VACCINE  04/22/2021   FOOT EXAM  Discontinued   HEMOGLOBIN A1C  Discontinued   DEXA SCAN  Discontinued   Fall Risk 04/20/2019 07/11/2019 07/11/2019 07/12/2019 07/12/2019  Falls in the past year? (No Data) - - - -  Number of falls in past year Emmi Telephone Survey Actual Response =  - - - -  Patient Fall Risk Level - High fall risk High fall risk High fall risk High fall risk     Vitals:   09/13/21 1051  BP: 117/60  Pulse: 62  Resp: 18  Temp: (!) 97.2 F (36.2 C)  Weight: 145 lb 9.6 oz (66 kg)  Height: 5\' 3"  (1.6 m)   Body mass index is 25.79 kg/m.  Physical Exam  GENERAL APPEARANCE: Well nourished. In no acute distress. Normal body habitus SKIN: Erythematous rashes on left axilla.   MOUTH and THROAT: Lips are without lesions. Oral mucosa is moist and without lesions.  RESPIRATORY: Breathing is  even & unlabored, BS CTAB CARDIAC: RRR, no murmur,no extra heart sounds, no edema GI: Abdomen soft, normal BS, no masses, no tenderness NEUROLOGICAL: There is no tremor. Speech is clear. Alert to self, disoriented to time and place. PSYCHIATRIC:  Affect and behavior are appropriate  Labs reviewed: Recent Labs    01/03/21 0000 04/03/21 0000 06/12/21 0000  NA 140 136* 141  K 3.9 4.1 4.1  CL 106 100 106  CO2 27* 25* 30*  BUN 15 17 15   CREATININE 0.6 0.6 0.6  CALCIUM 9.2 8.9 9.4   Recent Labs    01/03/21 0000 06/12/21 0000  AST 22 19  ALT 11 12  ALKPHOS 103 226*  ALBUMIN 3.4* 3.1*   Recent Labs    04/03/21 0000 05/22/21 0000 06/12/21 0000  WBC 3.1 3.8 4.2  HGB 11.2* 11.5* 12.0  HCT 31* 33* 34*  PLT 176 233 232   Lab Results  Component Value Date   TSH 3.20 07/24/2021   Lab Results  Component Value Date   HGBA1C 7.8 07/01/2021   Lab Results  Component Value Date  CHOL 141 01/03/2021   HDL 43 01/03/2021   LDLCALC 78 01/03/2021   TRIG 101 01/03/2021    Significant Diagnostic Results in last 30 days:  No results found.  Assessment/Plan  1. Skin yeast infection - will start nystatin 100,000 unit/gram powder applied to rashes on the left axilla 4 times daily x2 weeks -   Keep area clean  2. Hypothyroidism due to acquired atrophy of thyroid Lab Results  Component Value Date   TSH 3.20 07/24/2021   -   Continue levothyroxine 50 mcg 1 tab daily  3. Elevated d-dimer -D-dimer 2.56, elevated -   Continue Eliquis 2.5 mg twice a day  4. Major depression, recurrent, chronic (HCC) -   PHQ-9 score 0, no depressed mood -   Continue sertraline 25 mg 1 tab daily  5. Late onset Alzheimer's dementia without behavioral disturbance, psychotic disturbance, mood disturbance, or anxiety, unspecified dementia severity ([place]) -   BIMS score 4/15, ranging in severe cognitive impairment -    Continue supportive care    Family/ staff Communication:   Discussed plan of  care with resident and charge nurse.  Labs/tests ordered:   None  Goals of care:   Long-term care   Durenda Age, DNP, MSN, FNP-BC [facility] [phone] (Monday-Friday 8:00 a.m. - 5:00 p.m.) [phone] (after hours)

## 2021-09-25 LAB — CBC
RBC: 3.88 (ref 3.87–5.11)
RBC: 3.88 (ref 3.87–5.11)

## 2021-09-25 LAB — CBC AND DIFFERENTIAL
HCT: 38 (ref 36–46)
HCT: 38 (ref 36–46)
Hemoglobin: 12.2 (ref 12.0–16.0)
Hemoglobin: 12.2 (ref 12.0–16.0)
Platelets: 231 (ref 150–399)
Platelets: 231 (ref 150–399)
WBC: 4.3
WBC: 4.3

## 2021-09-25 LAB — BASIC METABOLIC PANEL
BUN: 13 (ref 4–21)
BUN: 13 (ref 4–21)
CO2: 29 — AB (ref 13–22)
CO2: 29 — AB (ref 13–22)
Chloride: 107 (ref 99–108)
Chloride: 107 (ref 99–108)
Creatinine: 0.6 (ref 0.5–1.1)
Creatinine: 0.6 (ref 0.5–1.1)
Glucose: 174
Glucose: 174
Potassium: 3.9 (ref 3.4–5.3)
Potassium: 3.9 (ref 3.4–5.3)
Sodium: 144 (ref 137–147)
Sodium: 144 (ref 137–147)

## 2021-09-25 LAB — COMPREHENSIVE METABOLIC PANEL
Calcium: 9.2 (ref 8.7–10.7)
Calcium: 9.2 (ref 8.7–10.7)
GFR calc Af Amer: >90
GFR calc non Af Amer: 82.72

## 2021-09-25 LAB — HEMOGLOBIN A1C
Hemoglobin A1C: 6
Hemoglobin A1C: 6.6

## 2021-10-04 ENCOUNTER — Non-Acute Institutional Stay (SKILLED_NURSING_FACILITY): Payer: Medicare Other | Admitting: Adult Health

## 2021-10-04 ENCOUNTER — Encounter: Payer: Self-pay | Admitting: Adult Health

## 2021-10-04 DIAGNOSIS — F339 Major depressive disorder, recurrent, unspecified: Secondary | ICD-10-CM

## 2021-10-04 DIAGNOSIS — G301 Alzheimer's disease with late onset: Secondary | ICD-10-CM

## 2021-10-04 DIAGNOSIS — F0280 Dementia in other diseases classified elsewhere without behavioral disturbance: Secondary | ICD-10-CM

## 2021-10-04 DIAGNOSIS — E1151 Type 2 diabetes mellitus with diabetic peripheral angiopathy without gangrene: Secondary | ICD-10-CM | POA: Diagnosis not present

## 2021-10-04 DIAGNOSIS — E034 Atrophy of thyroid (acquired): Secondary | ICD-10-CM

## 2021-10-04 DIAGNOSIS — R7989 Other specified abnormal findings of blood chemistry: Secondary | ICD-10-CM

## 2021-10-04 NOTE — Progress Notes (Signed)
Location:  [facility] Room Number: 125-A Place of Service:  SNF (31) Provider:  Kenard GowerMedina-Vargas, Jahlia Omura, DNP, FNP-BC  Patient Care Team: Pecola LawlessHopper, William F, MD as PCP - General (Internal Medicine) Medina-Vargas, Margit BandaMonina C, NP as Nurse Practitioner (Internal Medicine)  Extended Emergency Contact Information Primary Emergency Contact: [name],[name] Address: [address]          [place], [place] [address] [place] Home Phone: [phone] Mobile Phone: [phone] Relation: Daughter  Code Status:  DNR  Goals of care: Advanced Directive information Advanced Directives 10/08/2021  Does Patient Have a Medical Advance Directive? Yes  Type of Advance Directive Out of facility DNR (pink MOST or yellow form);Healthcare Power of Attorney  Does patient want to make changes to medical advance directive? No - Patient declined  Copy of Healthcare Power of Attorney in Chart? Yes - validated most recent copy scanned in chart (See row information)  Would patient like information on creating a medical advance directive? -  Pre-existing out of facility DNR order (yellow form or pink MOST form) Yellow form placed in chart (order not valid for inpatient use)     Chief Complaint  Patient presents with   Medical Management of Chronic Issues    Routine Visit    HPI:  Pt is a 88 y.o. female seen today for medical management of chronic diseases. She is a long-term care resident of [facility]. She ha a PMH of Alzheimer's disease, CVA, depression, hypertension, diabetes mellitus and hypothyroidism.  Elevated d-dimer  -  2.28, elevated (09/25/21), 2.56 (07/24/21), 2.21 (06/07/21), takes Eliquis 2.5 mg BID  Hypothyroidism due to acquired atrophy of thyroid -  takes Levothyroxine 50 mcg 1 tab daily  Major depression, recurrent, chronic (HCC) -  PHQ-9 score is 0, no depressed mood, currently takes Sertraline 25 mg 1 tab daily  Diabetes mellitus with  peripheral vascular disease (HCC) -  A1c 6.6, improved from 7.8 (06/29/21), diet-controlled   Past Medical History:  Diagnosis Date   Cerebrovascular disease    Dementia (HCC)    Depression    DM type 2 (diabetes mellitus, type 2) (HCC) 07/02/2011   Elevated LFTs    Frequent falls    HTN (hypertension) 07/02/2011   Thrombocytopenia (HCC)    UTI (lower urinary tract infection)    Past Surgical History:  Procedure Laterality Date   ABDOMINAL HYSTERECTOMY     CHOLECYSTECTOMY      No Known Allergies  Outpatient Encounter Medications as of 10/04/2021  Medication Sig   acetaminophen (TYLENOL) 325 MG tablet Take 650 mg by mouth every 6 (six) hours as needed.   apixaban (ELIQUIS) 2.5 MG TABS tablet Take 2.5 mg by mouth 2 (two) times daily. FOR ELEVATED D-DIMER   aspirin EC 81 MG tablet Take 81 mg by mouth daily.   bisacodyl (DULCOLAX) 10 MG suppository If not relieved by MOM, give 10 mg Bisacodyl suppositiory rectally X 1 dose in 24 hours as needed (Do not use constipation standing orders for residents with renal failure/CFR less than 30. Contact MD for orders) (Physician Order)   Ensure (ENSURE) Take by mouth daily.   levothyroxine (SYNTHROID) 50 MCG tablet Take 50 mcg by mouth daily before breakfast.   magnesium hydroxide (MILK OF MAGNESIA) 400 MG/5ML suspension If no BM in 3 days, give 30 cc Milk of Magnesium p.o. x 1 dose in 24 hours as needed (Do not use standing constipation orders for residents with renal failure CFR less than 30.  Contact MD for orders) (Physician Order)   Multiple Vitamin (MULTIVITAMIN WITH MINERALS) TABS tablet Take 1 tablet by mouth 2 (two) times daily.   NON FORMULARY Diet:MECHANICAL SOFT, THIN LIQUIDS   Nutritional Supplements (NUTRITIONAL SUPPLEMENT PO) Take by mouth. Magic cup   nystatin ointment (MYCOSTATIN) Apply 1 application topically 2 (two) times daily.   sertraline (ZOLOFT) 25 MG tablet Take 25 mg by mouth daily.   Sodium Phosphates (RA SALINE ENEMA  RE) If not relieved by Biscodyl suppository, give disposable Saline Enema rectally X 1 dose/24 hrs as needed (Do not use constipation standing orders for residents with renal failure/CFR less than 30. Contact MD for orders)(Physician Or   No facility-administered encounter medications on file as of 10/04/2021.    Review of Systems   Unable to obtain due to dementia.    Immunization History  Administered Date(s) Administered   Influenza, High Dose Seasonal PF 06/29/2019   Influenza-Unspecified 07/12/2020   Moderna Sars-Covid-2 Vaccination 10/10/2019, 11/07/2019, 08/28/2020   Pneumococcal Polysaccharide-23 07/03/2011   Tdap 10/22/2012   Pertinent  Health Maintenance Due  Topic Date Due   OPHTHALMOLOGY EXAM  Never done   URINE MICROALBUMIN  Never done   INFLUENZA VACCINE  04/22/2021   FOOT EXAM  Discontinued   HEMOGLOBIN A1C  Discontinued   DEXA SCAN  Discontinued   Fall Risk 04/20/2019 07/11/2019 07/11/2019 07/12/2019 07/12/2019  Falls in the past year? (No Data) - - - -  Number of falls in past year Emmi Telephone Survey Actual Response =  - - - -  Patient Fall Risk Level - High fall risk High fall risk High fall risk High fall risk     Vitals:   10/04/21 1534  BP: 91/63  Pulse: 68  Resp: 18  Temp: (!) 97.5 F (36.4 C)  SpO2: 97%  Weight: 144 lb 12.8 oz (65.7 kg)  Height: 5\' 3"  (1.6 m)   Body mass index is 25.65 kg/m.  Physical Exam Constitutional:      Appearance: Normal appearance.  HENT:     Head: Normocephalic and atraumatic.     Nose: Nose normal.     Mouth/Throat:     Mouth: Mucous membranes are moist.  Eyes:     Conjunctiva/sclera: Conjunctivae normal.  Cardiovascular:     Rate and Rhythm: Normal rate and regular rhythm.  Pulmonary:     Effort: Pulmonary effort is normal.     Breath sounds: Normal breath sounds.  Abdominal:     General: Bowel sounds are normal.     Palpations: Abdomen is soft.  Musculoskeletal:        General: No swelling.      Cervical back: Normal range of motion.  Skin:    General: Skin is warm and dry.  Neurological:     Mental Status: She is alert.     Comments: Alert to self, disoriented to time and place.  Psychiatric:        Mood and Affect: Mood normal.        Behavior: Behavior normal.       Labs reviewed: Recent Labs    04/03/21 0000 06/12/21 0000 09/25/21 0000  NA 136* 141 144   144  K 4.1 4.1 3.9   3.9  CL 100 106 107   107  CO2 25* 30* 29*   29*  BUN 17 15 13   13   CREATININE 0.6 0.6 0.6   0.6  CALCIUM 8.9 9.4 9.2   9.2   Recent Labs  01/03/21 0000 06/12/21 0000  AST 22 19  ALT 11 12  ALKPHOS 103 226*  ALBUMIN 3.4* 3.1*   Recent Labs    05/22/21 0000 06/12/21 0000 09/25/21 0000  WBC 3.8 4.2 4.3   4.3  HGB 11.5* 12.0 12.2   12.2  HCT 33* 34* 38   38  PLT 233 232 231   231   Lab Results  Component Value Date   TSH 3.20 07/24/2021   Lab Results  Component Value Date   HGBA1C 6.6 09/25/2021         Lab Results  Component Value Date   CHOL 141 01/03/2021   HDL 43 01/03/2021   LDLCALC 78 01/03/2021   TRIG 101 01/03/2021    Significant Diagnostic Results in last 30 days:  No results found.  Assessment/Plan  1. Elevated d-dimer - d-dimer 2.28, still elevated -  no chest pains nor SOB -  continue Eliquis  2. Hypothyroidism due to acquired atrophy of thyroid Lab Results  Component Value Date   TSH 3.20 07/24/2021   -  continue Levothyroxine  3. Major depression, recurrent, chronic (HCC) -  mood is stable, continue Sertraline  4. Diabetes mellitus with peripheral vascular disease (HCC) Lab Results  Component Value Date   HGBA1C 6.6 09/25/2021        -  diet-controlled  5. Late onset Alzheimer's dementia without behavioral disturbance, psychotic disturbance, mood disturbance, or anxiety, unspecified dementia severity (HCC) -  continue supportive care   Family/ staff Communication: Discussed plan of care with charge nurse.  Labs/tests  ordered: None    Kenard Gower, DNP, MSN, FNP-BC [facility] [phone] (Monday-Friday 8:00 a.m. - 5:00 p.m.) [phone] (after hours)

## 2021-10-08 ENCOUNTER — Non-Acute Institutional Stay (SKILLED_NURSING_FACILITY): Payer: Medicare Other | Admitting: Adult Health

## 2021-10-08 ENCOUNTER — Encounter: Payer: Self-pay | Admitting: Adult Health

## 2021-10-08 DIAGNOSIS — R627 Adult failure to thrive: Secondary | ICD-10-CM

## 2021-10-08 DIAGNOSIS — E034 Atrophy of thyroid (acquired): Secondary | ICD-10-CM | POA: Diagnosis not present

## 2021-10-08 DIAGNOSIS — F339 Major depressive disorder, recurrent, unspecified: Secondary | ICD-10-CM

## 2021-10-08 DIAGNOSIS — F0280 Dementia in other diseases classified elsewhere without behavioral disturbance: Secondary | ICD-10-CM

## 2021-10-08 DIAGNOSIS — Z7189 Other specified counseling: Secondary | ICD-10-CM

## 2021-10-08 DIAGNOSIS — E1151 Type 2 diabetes mellitus with diabetic peripheral angiopathy without gangrene: Secondary | ICD-10-CM

## 2021-10-08 DIAGNOSIS — G301 Alzheimer's disease with late onset: Secondary | ICD-10-CM

## 2021-10-08 LAB — D-DIMER, QUANTITATIVE: D-Dimer, Quant: 2.28

## 2021-10-08 NOTE — Progress Notes (Signed)
Location:  [facility] Room Number: 125-A Place of Service:  SNF (31) Provider:  Kenard Gower, DNP, FNP-BC  Patient Care Team: Pecola Lawless, MD as PCP - General (Internal Medicine) Medina-Vargas, Margit Banda, NP as Nurse Practitioner (Internal Medicine)  Extended Emergency Contact Information Primary Emergency Contact: JenniferBrenda Address: [address]          [place], [place] [address] [place] Home Phone: [phone] Mobile Phone: [phone] Relation: Daughter  Code Status:  DNR  Goals of care: Advanced Directive information Advanced Directives 10/08/2021  Does Patient Have a Medical Advance Directive? Yes  Type of Advance Directive Out of facility DNR (pink MOST or yellow form);Healthcare Power of Attorney  Does patient want to make changes to medical advance directive? No - Patient declined  Copy of Healthcare Power of Attorney in Chart? Yes - validated most recent copy scanned in chart (See row information)  Would patient like information on creating a medical advance directive? -  Pre-existing out of facility DNR order (yellow form or pink MOST form) Yellow form placed in chart (order not valid for inpatient use)     Chief Complaint  Patient presents with   Advanced Directive    Advance Care Planning     HPI:  Pt is a 88 y.o. female who had a care plan meeting today attended by social workers, NP, life enrichment and daughter, who attended via telephone conference. Resident is a long-term care resident of [facility]. She remains to be DNR. Discussed medications, vital signs and weights. She does not participate in facility activities but likes to eat snacks in the dayroom while watching tv. Latest weight is 144.8 lbs, down 0.55% in 33 days, down 5.73% in 91 days and down 3.21% in 181 days. Daughter does not want feeding tubes. The meeting lasted for 20 minutes.  Past Medical History:  Diagnosis  Date   Cerebrovascular disease    Dementia (HCC)    Depression    DM type 2 (diabetes mellitus, type 2) (HCC) 07/02/2011   Elevated LFTs    Frequent falls    HTN (hypertension) 07/02/2011   Thrombocytopenia (HCC)    UTI (lower urinary tract infection)    Past Surgical History:  Procedure Laterality Date   ABDOMINAL HYSTERECTOMY     CHOLECYSTECTOMY      No Known Allergies  Outpatient Encounter Medications as of 10/08/2021  Medication Sig   acetaminophen (TYLENOL) 325 MG tablet Take 650 mg by mouth every 6 (six) hours as needed.   apixaban (ELIQUIS) 2.5 MG TABS tablet Take 2.5 mg by mouth 2 (two) times daily. FOR ELEVATED D-DIMER   aspirin EC 81 MG tablet Take 81 mg by mouth daily.   bisacodyl (DULCOLAX) 10 MG suppository If not relieved by MOM, give 10 mg Bisacodyl suppositiory rectally X 1 dose in 24 hours as needed (Do not use constipation standing orders for residents with renal failure/CFR less than 30. Contact MD for orders) (Physician Order)   Ensure (ENSURE) Take by mouth daily.   levothyroxine (SYNTHROID) 50 MCG tablet Take 50 mcg by mouth daily before breakfast.   magnesium hydroxide (MILK OF MAGNESIA) 400 MG/5ML suspension If no BM in 3 days, give 30 cc Milk of Magnesium p.o. x 1 dose in 24 hours as needed (Do not use standing constipation orders for residents with renal failure CFR less than 30. Contact MD for orders) (Physician Order)   Multiple Vitamin (MULTIVITAMIN WITH MINERALS) TABS tablet Take  1 tablet by mouth 2 (two) times daily.   NON FORMULARY Diet:MECHANICAL SOFT, THIN LIQUIDS   Nutritional Supplements (NUTRITIONAL SUPPLEMENT PO) Take by mouth. Magic cup   nystatin ointment (MYCOSTATIN) Apply 1 application topically 2 (two) times daily.   sertraline (ZOLOFT) 25 MG tablet Take 25 mg by mouth daily.   Sodium Phosphates (RA SALINE ENEMA RE) If not relieved by Biscodyl suppository, give disposable Saline Enema rectally X 1 dose/24 hrs as needed (Do not use  constipation standing orders for residents with renal failure/CFR less than 30. Contact MD for orders)(Physician Or   No facility-administered encounter medications on file as of 10/08/2021.    Review of Systems  Unable to obtain due to dementia.    Immunization History  Administered Date(s) Administered   Influenza, High Dose Seasonal PF 06/29/2019   Influenza-Unspecified 07/12/2020   Moderna Sars-Covid-2 Vaccination 10/10/2019, 11/07/2019, 08/28/2020   Pneumococcal Polysaccharide-23 07/03/2011   Tdap 10/22/2012   Pertinent  Health Maintenance Due  Topic Date Due   OPHTHALMOLOGY EXAM  Never done   URINE MICROALBUMIN  Never done   INFLUENZA VACCINE  04/22/2021   FOOT EXAM  Discontinued   HEMOGLOBIN A1C  Discontinued   DEXA SCAN  Discontinued   Fall Risk 04/20/2019 07/11/2019 07/11/2019 07/12/2019 07/12/2019  Falls in the past year? (No Data) - - - -  Number of falls in past year Emmi Telephone Survey Actual Response =  - - - -  Patient Fall Risk Level - High fall risk High fall risk High fall risk High fall risk     Vitals:   10/08/21 0904  BP: 124/60  Pulse: 82  Resp: 18  Temp: (!) 97.3 F (36.3 C)  Weight: 144 lb 12.8 oz (65.7 kg)  Height: 5\' 3"  (1.6 m)   Body mass index is 25.65 kg/m.  Physical Exam Constitutional:      Appearance: Normal appearance.  HENT:     Head: Normocephalic and atraumatic.     Nose: Nose normal.     Mouth/Throat:     Mouth: Mucous membranes are moist.  Eyes:     Conjunctiva/sclera: Conjunctivae normal.  Cardiovascular:     Rate and Rhythm: Normal rate and regular rhythm.  Pulmonary:     Effort: Pulmonary effort is normal.     Breath sounds: Normal breath sounds.  Abdominal:     General: Bowel sounds are normal.     Palpations: Abdomen is soft.  Musculoskeletal:        General: No swelling.  Skin:    General: Skin is warm and dry.  Neurological:     Mental Status: She is alert.     Comments: Alert to self, disoriented to  time and place.  Psychiatric:        Mood and Affect: Mood normal.        Behavior: Behavior normal.       Labs reviewed: Recent Labs    04/03/21 0000 06/12/21 0000 09/25/21 0000  NA 136* 141 144   144  K 4.1 4.1 3.9   3.9  CL 100 106 107   107  CO2 25* 30* 29*   29*  BUN 17 15 13   13   CREATININE 0.6 0.6 0.6   0.6  CALCIUM 8.9 9.4 9.2   9.2   Recent Labs    01/03/21 0000 06/12/21 0000  AST 22 19  ALT 11 12  ALKPHOS 103 226*  ALBUMIN 3.4* 3.1*   Recent Labs  05/22/21 0000 06/12/21 0000 09/25/21 0000  WBC 3.8 4.2 4.3   4.3  HGB 11.5* 12.0 12.2   12.2  HCT 33* 34* 38   38  PLT 233 232 231   231   Lab Results  Component Value Date   TSH 3.20 07/24/2021   Lab Results  Component Value Date   HGBA1C 6.6 09/25/2021        Lab Results  Component Value Date   CHOL 141 01/03/2021   HDL 43 01/03/2021   LDLCALC 78 01/03/2021   TRIG 101 01/03/2021    Significant Diagnostic Results in last 30 days:  No results found.  Assessment/Plan  1. Advance care planning -    DNR -    Discussed with medications, vital signs and weights  2. Hypothyroidism due to acquired atrophy of thyroid Lab Results  Component Value Date   TSH 3.20 07/24/2021   -    Continue levothyroxine  3. Major depression, recurrent, chronic (HCC) -  PHQ-9 score is 0, no depressed mood -   Continue sertraline  4. Diabetes mellitus with peripheral vascular disease (HCC) -Diet controlled  5. Adult failure to thrive Wt Readings from Last 3 Encounters:  10/08/21 144 lb 12.8 oz (65.7 kg)  10/04/21 144 lb 12.8 oz (65.7 kg)  09/13/21 145 lb 9.6 oz (66 kg)   -  daughter does not want resident to have peg tube -  continue supplementations with multivitamins, Ensure and Magic cup  6. Late onset Alzheimer's dementia without behavioral disturbance, psychotic disturbance, mood disturbance, or anxiety, unspecified dementia severity (HCC) -   BIMS score 4/15, ranging in severe cognitive  impairment -   Continue supportive care     Family/ staff Communication: Discussed plan of care with daughter and IDT.  Labs/tests ordered:   None    Kenard Gower, DNP, MSN, FNP-BC [facility] [phone] (Monday-Friday 8:00 a.m. - 5:00 p.m.) [phone] (after hours)

## 2021-10-28 ENCOUNTER — Encounter: Payer: Self-pay | Admitting: Adult Health

## 2021-10-28 ENCOUNTER — Non-Acute Institutional Stay (SKILLED_NURSING_FACILITY): Payer: Medicare Other | Admitting: Adult Health

## 2021-10-28 DIAGNOSIS — R630 Anorexia: Secondary | ICD-10-CM

## 2021-10-28 DIAGNOSIS — E1169 Type 2 diabetes mellitus with other specified complication: Secondary | ICD-10-CM | POA: Diagnosis not present

## 2021-10-28 NOTE — Progress Notes (Signed)
Location:  [facility] Room Number: 125 A Place of Service:  SNF (31) Provider:  Kenard Gower, DNP, FNP-BC  Patient Care Team: Pecola Lawless, MD as PCP - General (Internal Medicine) Medina-Vargas, Margit Banda, NP as Nurse Practitioner (Internal Medicine)  Extended Emergency Contact Information Primary Emergency Contact: [name],[name] Address: [address]          [place], [place] [address] [place] Home Phone: [phone] Mobile Phone: [phone] Relation: Daughter  Code Status:   DNR  Goals of care: Advanced Directive information Advanced Directives 10/08/2021  Does Patient Have a Medical Advance Directive? Yes  Type of Advance Directive Out of facility DNR (pink MOST or yellow form);Healthcare Power of Attorney  Does patient want to make changes to medical advance directive? No - Patient declined  Copy of Healthcare Power of Attorney in Chart? Yes - validated most recent copy scanned in chart (See row information)  Would patient like information on creating a medical advance directive? -  Pre-existing out of facility DNR order (yellow form or pink MOST form) Yellow form placed in chart (order not valid for inpatient use)     Chief Complaint  Patient presents with   Acute Visit    Poor appetite    HPI:  Pt is a 88 y.o. female seen today for an acute visit. She is a long-term care resident of [facility]. She had a weight loss of 4.83% in 31 days. Latest weight is  137.8 lbs Body mass index is 24.41 kg/m. Magic cup BID was  started for supplementation. Daughter of resident does not want feeding tube for resident. She has diagnosis of Alzheimer's dementia with BIMS score of 4/15, ranging in severe cognitive impairment.  Past Medical History:  Diagnosis Date   Cerebrovascular disease    Dementia (HCC)    Depression    DM type 2 (diabetes mellitus, type 2) (HCC) 07/02/2011   Elevated LFTs    Frequent  falls    HTN (hypertension) 07/02/2011   Thrombocytopenia (HCC)    UTI (lower urinary tract infection)    Past Surgical History:  Procedure Laterality Date   ABDOMINAL HYSTERECTOMY     CHOLECYSTECTOMY      No Known Allergies  Outpatient Encounter Medications as of 10/28/2021  Medication Sig   acetaminophen (TYLENOL) 325 MG tablet Take 650 mg by mouth every 6 (six) hours as needed.   apixaban (ELIQUIS) 2.5 MG TABS tablet Take 2.5 mg by mouth 2 (two) times daily. FOR ELEVATED D-DIMER   aspirin EC 81 MG tablet Take 81 mg by mouth daily.   bisacodyl (DULCOLAX) 10 MG suppository If not relieved by MOM, give 10 mg Bisacodyl suppositiory rectally X 1 dose in 24 hours as needed (Do not use constipation standing orders for residents with renal failure/CFR less than 30. Contact MD for orders) (Physician Order)   Ensure (ENSURE) Take by mouth daily.   levothyroxine (SYNTHROID) 50 MCG tablet Take 50 mcg by mouth daily before breakfast.   magnesium hydroxide (MILK OF MAGNESIA) 400 MG/5ML suspension If no BM in 3 days, give 30 cc Milk of Magnesium p.o. x 1 dose in 24 hours as needed (Do not use standing constipation orders for residents with renal failure CFR less than 30. Contact MD for orders) (Physician Order)   Multiple Vitamin (MULTIVITAMIN WITH MINERALS) TABS tablet Take 1 tablet by mouth 2 (two) times daily.   NON FORMULARY Diet:MECHANICAL SOFT, THIN LIQUIDS   Nutritional Supplements (NUTRITIONAL  SUPPLEMENT PO) Take by mouth. Magic cup   nystatin ointment (MYCOSTATIN) Apply 1 application topically 2 (two) times daily.   sertraline (ZOLOFT) 25 MG tablet Take 25 mg by mouth daily.   Sodium Phosphates (RA SALINE ENEMA RE) If not relieved by Biscodyl suppository, give disposable Saline Enema rectally X 1 dose/24 hrs as needed (Do not use constipation standing orders for residents with renal failure/CFR less than 30. Contact MD for orders)(Physician Or   No facility-administered encounter medications  on file as of 10/28/2021.    Review of Systems  Constitutional:  Positive for appetite change. Negative for chills, fatigue and fever.  HENT:  Negative for congestion, hearing loss, rhinorrhea and sore throat.   Eyes: Negative.   Respiratory:  Negative for cough, shortness of breath and wheezing.   Cardiovascular:  Negative for chest pain, palpitations and leg swelling.  Gastrointestinal:  Negative for abdominal pain, constipation, diarrhea, nausea and vomiting.  Genitourinary:  Negative for dysuria.  Musculoskeletal:  Negative for arthralgias, back pain and myalgias.  Skin:  Negative for color change, rash and wound.  Neurological:  Negative for dizziness, weakness and headaches.  Psychiatric/Behavioral:  Negative for behavioral problems. The patient is not nervous/anxious.       Immunization History  Administered Date(s) Administered   Influenza, High Dose Seasonal PF 06/29/2019   Influenza-Unspecified 07/12/2020   Moderna Sars-Covid-2 Vaccination 10/10/2019, 11/07/2019, 08/28/2020   Pneumococcal Polysaccharide-23 07/03/2011   Tdap 10/22/2012   Pertinent  Health Maintenance Due  Topic Date Due   OPHTHALMOLOGY EXAM  Never done   URINE MICROALBUMIN  Never done   INFLUENZA VACCINE  04/22/2021   FOOT EXAM  Discontinued   HEMOGLOBIN A1C  Discontinued   DEXA SCAN  Discontinued   Fall Risk 04/20/2019 07/11/2019 07/11/2019 07/12/2019 07/12/2019  Falls in the past year? (No Data) - - - -  Number of falls in past year Emmi Telephone Survey Actual Response =  - - - -  Patient Fall Risk Level - High fall risk High fall risk High fall risk High fall risk     Vitals:   10/28/21 1522  BP: 112/61  Pulse: 60  Resp: 20  Temp: 97.7 F (36.5 C)  Weight: 137 lb 12.8 oz (62.5 kg)  Height: 5\' 3"  (1.6 m)   Body mass index is 24.41 kg/m.  Physical Exam Constitutional:      General: She is not in acute distress.    Appearance: Normal appearance.  HENT:     Head: Normocephalic and  atraumatic.     Nose: Nose normal.     Mouth/Throat:     Mouth: Mucous membranes are moist.  Eyes:     Conjunctiva/sclera: Conjunctivae normal.  Cardiovascular:     Rate and Rhythm: Normal rate and regular rhythm.  Pulmonary:     Effort: Pulmonary effort is normal.     Breath sounds: Normal breath sounds.  Abdominal:     General: Bowel sounds are normal.     Palpations: Abdomen is soft.  Musculoskeletal:     Cervical back: Normal range of motion.  Skin:    General: Skin is warm and dry.  Neurological:     General: No focal deficit present.     Mental Status: She is alert. She is disoriented.     Comments: Alert to self, disoriented to time and place.  Psychiatric:        Mood and Affect: Mood normal.        Behavior: Behavior normal.  Labs reviewed: Recent Labs    04/03/21 0000 06/12/21 0000 09/25/21 0000  NA 136* 141 144   144  K 4.1 4.1 3.9   3.9  CL 100 106 107   107  CO2 25* 30* 29*   29*  BUN 17 15 13   13   CREATININE 0.6 0.6 0.6   0.6  CALCIUM 8.9 9.4 9.2   9.2   Recent Labs    01/03/21 0000 06/12/21 0000  AST 22 19  ALT 11 12  ALKPHOS 103 226*  ALBUMIN 3.4* 3.1*   Recent Labs    05/22/21 0000 06/12/21 0000 09/25/21 0000  WBC 3.8 4.2 4.3   4.3  HGB 11.5* 12.0 12.2   12.2  HCT 33* 34* 38   38  PLT 233 232 231   231   Lab Results  Component Value Date   TSH 3.20 07/24/2021   Lab Results  Component Value Date   HGBA1C 6.6 09/25/2021   HGBA1C 6.0 09/25/2021   Lab Results  Component Value Date   CHOL 141 01/03/2021   HDL 43 01/03/2021   LDLCALC 78 01/03/2021   TRIG 101 01/03/2021    Significant Diagnostic Results in last 30 days:  No results found.  Assessment/Plan  1. Poor appetite -  will start Remeron 15 mg at bedtime to increase appetite  2. Type 2 diabetes mellitus with other specified complication, without long-term current use of insulin (HCC) Lab Results  Component Value Date   HGBA1C 6.6 09/25/2021  -   diet-controlled    Family/ staff Communication: Discussed plan of care with resident and charge nurse  Labs/tests ordered:   None    Kenard Gower, DNP, MSN, FNP-BC [facility] [phone] (Monday-Friday 8:00 a.m. - 5:00 p.m.) [phone] (after hours)

## 2021-11-05 ENCOUNTER — Encounter: Payer: Self-pay | Admitting: Internal Medicine

## 2021-11-05 ENCOUNTER — Non-Acute Institutional Stay (SKILLED_NURSING_FACILITY): Payer: Medicare Other | Admitting: Internal Medicine

## 2021-11-05 DIAGNOSIS — I10 Essential (primary) hypertension: Secondary | ICD-10-CM

## 2021-11-05 DIAGNOSIS — D696 Thrombocytopenia, unspecified: Secondary | ICD-10-CM

## 2021-11-05 DIAGNOSIS — E034 Atrophy of thyroid (acquired): Secondary | ICD-10-CM

## 2021-11-05 DIAGNOSIS — N182 Chronic kidney disease, stage 2 (mild): Secondary | ICD-10-CM | POA: Diagnosis not present

## 2021-11-05 NOTE — Patient Instructions (Signed)
See assessment and plan under each diagnosis in the problem list and acutely for this visit 

## 2021-11-05 NOTE — Assessment & Plan Note (Signed)
Patient is unable to provide any history or follow any commands.

## 2021-11-05 NOTE — Assessment & Plan Note (Signed)
07/24/2021 TSH therapeutic with a value of 3.20.  No change indicated

## 2021-11-05 NOTE — Assessment & Plan Note (Addendum)
09/25/2021 platelet count 231,000; thrombocytopenia resolved.  No bleeding dyscrasias reported in the context of Eliquis prophylaxis.

## 2021-11-05 NOTE — Assessment & Plan Note (Addendum)
BP controlled w/o antihypertensive medications  

## 2021-11-05 NOTE — Assessment & Plan Note (Signed)
Current creatinine 0.6 with a GFR of 82.72 indicating CKD high stage II.  No change in medications indicated.

## 2021-11-05 NOTE — Progress Notes (Signed)
° °  NURSING HOME LOCATION:  [facility] ROOM NUMBER:  125 A  CODE STATUS:  DNR  PCP:  Durenda Age NP,PSC  This is a nursing facility follow up visit of chronic medical diagnoses & to document compliance with Regulation 483.30 (c) in The [place] Manual Phase 2 which mandates caregiver visit ( visits can alternate among physician, PA or NP as per statutes) within 10 days of 30 days / 60 days/ 90 days post admission to SNF date    Interim medical record and care since last SNF visit was updated with review of diagnostic studies and change in clinical status since last visit were documented.  HPI: She is a permanent resident of this facility with diagnoses of diabetes with cerebrovascular disease, essential hypertension, history of thrombocytopenia, chronic depression, and dementia.  Recent labs were completed 09/25/2021.  Creatinine was 0.6 and GFR 82.72 indicating CKD stage II.  Albumin was 3.1; total protein was last checked 07/12/2019 and was 4.9.  Previously documented anemia has resolved; on 1/4 H/H was 12.2/38 and platelet count 231,000. TSH was therapeutic on 07/24/2021 with a value of 3.20. Lipids are at the updated in April; on 01/03/2021 LDL was mildly above goal with a value of 78.  Review of systems: Patient can provide no history and cannot follow commands dependably.  She is disoriented x3.  Physical exam:  Pertinent or positive findings: She appears chronically ill.  Hair is thin and disheveled.  She was initially asleep despite the fact that he was almost 1:00 in the afternoon.  The sheet was pulled over her head.  When I attempted to pull the covers down to examine her she resisted and kept trying to pull the sheet back over her head.  She did not open her eyes during the exam.  She is edentulous.  Heart sounds are distant.  Breath sounds are decreased.  Dorsalis pedis pulses are stronger than posterior tibial pulses.  Feet are in  booties.  She has interosseous wasting of the hands.  She did oppose my hand with her lower extremities and both were profoundly weak.  General appearance: no acute distress, increased work of breathing is present.   Lymphatic: No lymphadenopathy about the head, neck, axilla. Ears:  External ear exam shows no significant lesions or deformities.   Nose:  External nasal examination shows no deformity or inflammation. Nasal mucosa are pink and moist without lesions, exudates Neck:  No thyromegaly, masses, tenderness noted.    Heart:  No gallop, murmur, click, rub .  Lungs:  without wheezes, rhonchi, rales, rubs. Abdomen: Bowel sounds are normal. Abdomen is soft and nontender with no organomegaly, hernias, masses. GU: Deferred  Extremities:  No cyanosis, clubbing, edema  Neurologic exam :Balance, Rhomberg, finger to nose testing could not be completed due to clinical state Skin: Warm & dry w/o tenting. No significant lesions or rash.  See summary under each active problem in the Problem List with associated updated therapeutic plan

## 2021-11-28 ENCOUNTER — Non-Acute Institutional Stay (SKILLED_NURSING_FACILITY): Payer: Medicare Other | Admitting: Adult Health

## 2021-11-28 ENCOUNTER — Encounter: Payer: Self-pay | Admitting: Adult Health

## 2021-11-28 DIAGNOSIS — R630 Anorexia: Secondary | ICD-10-CM | POA: Diagnosis not present

## 2021-11-28 DIAGNOSIS — F339 Major depressive disorder, recurrent, unspecified: Secondary | ICD-10-CM | POA: Diagnosis not present

## 2021-11-28 NOTE — Progress Notes (Signed)
Location:  [facility] Room Number: 125-A Place of Service:  SNF (31) Provider:  Kenard Gower, DNP, FNP-BC  Patient Care Team: Jennifer Lawless, MD as PCP - General (Internal Medicine) Medina-Vargas, Margit Banda, NP as Nurse Practitioner (Internal Medicine)  Extended Emergency Contact Information Primary Emergency Contact: Jennifer Kerr Address: [address]          [place], [place] [address] [place] Home Phone: [phone] Mobile Phone: [phone] Relation: Daughter  Code Status:  DNR  Goals of care: Advanced Directive information Advanced Directives 11/28/2021  Does Patient Have a Medical Advance Directive? Yes  Type of Estate agent of [place];Out of facility DNR (pink MOST or yellow form)  Does patient want to make changes to medical advance directive? No - Patient declined  Copy of Healthcare Power of Attorney in Chart? Yes - validated most recent copy scanned in chart (See row information)  Would patient like information on creating a medical advance directive? -  Pre-existing out of facility DNR order (yellow form or pink MOST form) -     Chief Complaint  Patient presents with   Acute Visit    Medication Management.    HPI:  Pt is a 88 y.o. female seen today for medication management. She is a long-term care resident of [facility]. She was started on Remeron 15 mg at bedtime to increase her appetite. Latest weight is 135.4 lbs. Her weight is stable and has been changed to monthly weights. Pharmacy recommended that Remeron 7.5 mg is better in treating poor appetite than 15 mg. She takes Sertraline 50 mg daily for depression. Latest PHQ-9 score is 0, no depressed mood.   Past Medical History:  Diagnosis Date   Cerebrovascular disease    Dementia (HCC)    Depression    DM type 2 (diabetes mellitus, type 2) (HCC) 07/02/2011   Elevated LFTs    Frequent falls    HTN  (hypertension) 07/02/2011   Thrombocytopenia (HCC)    UTI (lower urinary tract infection)    Past Surgical History:  Procedure Laterality Date   ABDOMINAL HYSTERECTOMY     CHOLECYSTECTOMY      No Known Allergies  Outpatient Encounter Medications as of 11/28/2021  Medication Sig   acetaminophen (TYLENOL) 325 MG tablet Take 650 mg by mouth every 6 (six) hours as needed.   apixaban (ELIQUIS) 2.5 MG TABS tablet Take 2.5 mg by mouth 2 (two) times daily. FOR ELEVATED D-DIMER   aspirin EC 81 MG tablet Take 81 mg by mouth daily.   bisacodyl (DULCOLAX) 10 MG suppository If not relieved by MOM, give 10 mg Bisacodyl suppositiory rectally X 1 dose in 24 hours as needed (Do not use constipation standing orders for residents with renal failure/CFR less than 30. Contact MD for orders) (Physician Order)   Ensure (ENSURE) Take by mouth daily.   levothyroxine (SYNTHROID) 50 MCG tablet Take 50 mcg by mouth daily before breakfast.   magnesium hydroxide (MILK OF MAGNESIA) 400 MG/5ML suspension If no BM in 3 days, give 30 cc Milk of Magnesium p.o. x 1 dose in 24 hours as needed (Do not use standing constipation orders for residents with renal failure CFR less than 30. Contact MD for orders) (Physician Order)   mirtazapine (REMERON) 15 MG tablet Take 15 mg by mouth at bedtime.   Multiple Vitamin (MULTIVITAMIN WITH MINERALS) TABS tablet Take 1 tablet by mouth 2 (two) times daily.   NON FORMULARY Diet:MECHANICAL SOFT, THIN  LIQUIDS   Nutritional Supplements (NUTRITIONAL SUPPLEMENT PO) Take by mouth. Magic cup   nystatin ointment (MYCOSTATIN) Apply 1 application topically 2 (two) times daily.   sertraline (ZOLOFT) 50 MG tablet Take 50 mg by mouth daily.   Sodium Phosphates (RA SALINE ENEMA RE) If not relieved by Biscodyl suppository, give disposable Saline Enema rectally X 1 dose/24 hrs as needed (Do not use constipation standing orders for residents with renal failure/CFR less than 30. Contact MD for  orders)(Physician Or   [DISCONTINUED] sertraline (ZOLOFT) 25 MG tablet Take 25 mg by mouth daily.   No facility-administered encounter medications on file as of 11/28/2021.    Review of Systems  Constitutional:  Negative for appetite change, chills, fatigue and fever.  HENT:  Negative for congestion, hearing loss, rhinorrhea and sore throat.   Eyes: Negative.   Respiratory:  Negative for cough, shortness of breath and wheezing.   Cardiovascular:  Negative for chest pain, palpitations and leg swelling.  Gastrointestinal:  Negative for abdominal pain, constipation, diarrhea, nausea and vomiting.  Genitourinary:  Negative for dysuria.  Musculoskeletal:  Negative for arthralgias, back pain and myalgias.  Skin:  Negative for color change, rash and wound.  Neurological:  Negative for dizziness, weakness and headaches.  Psychiatric/Behavioral:  Negative for behavioral problems. The patient is not nervous/anxious.       Immunization History  Administered Date(s) Administered   Fluad Quad(high Dose 65+) 07/02/2021   Influenza, High Dose Seasonal PF 06/29/2019   Influenza-Unspecified 07/12/2020   Moderna Sars-Covid-2 Vaccination 10/10/2019, 11/07/2019, 08/28/2020   Pneumococcal Polysaccharide-23 07/03/2011   Tdap 10/22/2012   Pertinent  Health Maintenance Due  Topic Date Due   OPHTHALMOLOGY EXAM  Never done   URINE MICROALBUMIN  Never done   INFLUENZA VACCINE  Completed   FOOT EXAM  Discontinued   HEMOGLOBIN A1C  Discontinued   DEXA SCAN  Discontinued   Fall Risk 04/20/2019 07/11/2019 07/11/2019 07/12/2019 07/12/2019  Falls in the past year? (No Data) - - - -  Number of falls in past year Emmi Telephone Survey Actual Response =  - - - -  Patient Fall Risk Level - High fall risk High fall risk High fall risk High fall risk     Vitals:   11/28/21 1556  BP: (!) 100/56  Pulse: 69  Resp: 20  Temp: (!) 97.3 F (36.3 C)  Height: 5\' 3"  (1.6 m)   Body mass index is 24.41  kg/m.  Physical Exam Constitutional:      General: She is not in acute distress.    Appearance: Normal appearance.  HENT:     Head: Normocephalic and atraumatic.     Nose: Nose normal.     Mouth/Throat:     Mouth: Mucous membranes are moist.  Eyes:     Conjunctiva/sclera: Conjunctivae normal.  Cardiovascular:     Rate and Rhythm: Normal rate and regular rhythm.  Pulmonary:     Effort: Pulmonary effort is normal.     Breath sounds: Normal breath sounds.  Abdominal:     General: Bowel sounds are normal.     Palpations: Abdomen is soft.  Musculoskeletal:     Cervical back: Normal range of motion.  Skin:    General: Skin is warm and dry.  Neurological:     Mental Status: She is alert. Mental status is at baseline. She is disoriented.  Psychiatric:        Mood and Affect: Mood normal.        Behavior: Behavior  normal.       Labs reviewed: Recent Labs    04/03/21 0000 06/12/21 0000 09/25/21 0000  NA 136* 141 144  144  K 4.1 4.1 3.9  3.9  CL 100 106 107  107  CO2 25* 30* 29*  29*  BUN 17 15 13  13   CREATININE 0.6 0.6 0.6  0.6  CALCIUM 8.9 9.4 9.2  9.2   Recent Labs    01/03/21 0000 06/12/21 0000  AST 22 19  ALT 11 12  ALKPHOS 103 226*  ALBUMIN 3.4* 3.1*   Recent Labs    05/22/21 0000 06/12/21 0000 09/25/21 0000  WBC 3.8 4.2 4.3  4.3  HGB 11.5* 12.0 12.2  12.2  HCT 33* 34* 38  38  PLT 233 232 231  231   Lab Results  Component Value Date   TSH 3.20 07/24/2021   Lab Results  Component Value Date   HGBA1C 6.6 09/25/2021   HGBA1C 6.0 09/25/2021   Lab Results  Component Value Date   CHOL 141 01/03/2021   HDL 43 01/03/2021   LDLCALC 78 01/03/2021   TRIG 101 01/03/2021    Significant Diagnostic Results in last 30 days:  No results found.  Assessment/Plan  1. Poor appetite Wt Readings from Last 3 Encounters:  10/28/21 137 lb 12.8 oz (62.5 kg)  10/08/21 144 lb 12.8 oz (65.7 kg)  10/04/21 144 lb 12.8 oz (65.7 kg)  Body mass index  is 23.99 kg/m.  -  Remeron dosage decreased to 7.5 mg at bedtime  2. Depression, recurrent (HCC) -  mood is stable, continue Sertraline     Family/ staff Communication: Discussed plan of care with resident and charge nurse.  Labs/tests ordered:   None    Jennifer Gower, DNP, MSN, FNP-BC [facility] [phone] (Monday-Friday 8:00 a.m. - 5:00 p.m.) [phone] (after hours)

## 2021-12-25 ENCOUNTER — Non-Acute Institutional Stay (SKILLED_NURSING_FACILITY): Payer: Medicare Other | Admitting: Adult Health

## 2021-12-25 ENCOUNTER — Encounter: Payer: Self-pay | Admitting: Adult Health

## 2021-12-25 DIAGNOSIS — Z7189 Other specified counseling: Secondary | ICD-10-CM | POA: Diagnosis not present

## 2021-12-25 DIAGNOSIS — R630 Anorexia: Secondary | ICD-10-CM

## 2021-12-25 DIAGNOSIS — E034 Atrophy of thyroid (acquired): Secondary | ICD-10-CM | POA: Diagnosis not present

## 2021-12-25 DIAGNOSIS — G301 Alzheimer's disease with late onset: Secondary | ICD-10-CM

## 2021-12-25 DIAGNOSIS — R7989 Other specified abnormal findings of blood chemistry: Secondary | ICD-10-CM

## 2021-12-25 DIAGNOSIS — F339 Major depressive disorder, recurrent, unspecified: Secondary | ICD-10-CM

## 2021-12-25 DIAGNOSIS — F0280 Dementia in other diseases classified elsewhere without behavioral disturbance: Secondary | ICD-10-CM

## 2021-12-25 NOTE — Progress Notes (Signed)
? ?Location:  [facility] Room Number: 125A ?Place of Service:  SNF (31) ?Provider:  Kenard GowerMedina-Vargas, Isla Sabree, DNP, FNP-BC ? ?Patient Care Team: ?Pecola LawlessHopper, William F, MD as PCP - General (Internal Medicine) ?Medina-Vargas, Margit BandaMonina C, NP as Nurse Practitioner (Internal Medicine) ? ?Extended Emergency Contact Information ?Primary Emergency Contact: [name],[name] ?Address: [address] ?         [place], [place] [address] [place] ?Home Phone: [phone] ?Mobile Phone: [phone] ?Relation: Daughter ? ?Code Status:  DNR ? ?Goals of care: Advanced Directive information ? ?  12/25/2021  ?  2:26 PM  ?Advanced Directives  ?Does Patient Have a Medical Advance Directive? Yes  ?Type of Advance Directive Out of facility DNR (pink MOST or yellow form)  ?Does patient want to make changes to medical advance directive? No - Patient declined  ?Pre-existing out of facility DNR order (yellow form or pink MOST form) Yellow form placed in chart (order not valid for inpatient use)  ? ? ? ?Chief Complaint  ?Patient presents with  ? Acute Visit  ?  Care plan meeting  ? ? ?HPI:  ?Pt is a 88 y.o. female who had a care plan meeting today attended by social worker, nurse practitioner, nurse practitioner student, dietitian and daughter Steward DroneBrenda, who attended by telephone conference.  She remains to be DNR.  Discussed medications, vital signs and weights.  No reported agitation.  Latest weight is 140.4 lbs, gain 5 lbs in a month.  She was recently restarted on Remeron 7.5 mg daily for poor appetite.  She remains to be on Eliquis 2.5 mg 1 tab twice a day for elevated D-dimer 2.35.  She denies chest pains.  She does not participate much in social activities except for eating and drinking/snacking activities.  She has refused restorative therapy last week.  She eats in the dining room.  The meeting lasted for 20 minutes ? ? ?Past Medical History:  ?Diagnosis Date  ? Cerebrovascular disease   ? Dementia (HCC)   ?  Depression   ? DM type 2 (diabetes mellitus, type 2) (HCC) 07/02/2011  ? Elevated LFTs   ? Frequent falls   ? HTN (hypertension) 07/02/2011  ? Thrombocytopenia (HCC)   ? UTI (lower urinary tract infection)   ? ?Past Surgical History:  ?Procedure Laterality Date  ? ABDOMINAL HYSTERECTOMY    ? CHOLECYSTECTOMY    ? ? ?No Known Allergies ? ?Outpatient Encounter Medications as of 12/25/2021  ?Medication Sig  ? acetaminophen (TYLENOL) 325 MG tablet Take 650 mg by mouth every 6 (six) hours as needed.  ? apixaban (ELIQUIS) 2.5 MG TABS tablet Take 2.5 mg by mouth 2 (two) times daily. FOR ELEVATED D-DIMER  ? aspirin EC 81 MG tablet Take 81 mg by mouth daily.  ? bisacodyl (DULCOLAX) 10 MG suppository If not relieved by MOM, give 10 mg Bisacodyl suppositiory rectally X 1 dose in 24 hours as needed (Do not use constipation standing orders for residents with renal failure/CFR less than 30. Contact MD for orders) (Physician Order)  ? Ensure (ENSURE) Take by mouth daily.  ? levothyroxine (SYNTHROID) 50 MCG tablet Take 50 mcg by mouth daily before breakfast.  ? magnesium hydroxide (MILK OF MAGNESIA) 400 MG/5ML suspension If no BM in 3 days, give 30 cc Milk of Magnesium p.o. x 1 dose in 24 hours as needed (Do not use standing constipation orders for residents with renal failure CFR less than 30. Contact MD for orders) (Physician Order)  ? mirtazapine (REMERON)  15 MG tablet Take 15 mg by mouth at bedtime.  ? Multiple Vitamin (MULTIVITAMIN WITH MINERALS) TABS tablet Take 1 tablet by mouth 2 (two) times daily.  ? NON FORMULARY Diet:MECHANICAL SOFT, THIN LIQUIDS  ? Nutritional Supplements (NUTRITIONAL SUPPLEMENT PO) Take by mouth. Magic cup  ? nystatin ointment (MYCOSTATIN) Apply 1 application topically 2 (two) times daily.  ? sertraline (ZOLOFT) 50 MG tablet Take 50 mg by mouth daily.  ? Sodium Phosphates (RA SALINE ENEMA RE) If not relieved by Biscodyl suppository, give disposable Saline Enema rectally X 1 dose/24 hrs as needed (Do not  use constipation standing orders for residents with renal failure/CFR less than 30. Contact MD for orders)(Physician Or  ? ?No facility-administered encounter medications on file as of 12/25/2021.  ? ? ?Review of Systems  ?Constitutional:  Negative for appetite change, chills, fatigue and fever.  ?HENT:  Negative for congestion, hearing loss, rhinorrhea and sore throat.   ?Eyes: Negative.   ?Respiratory:  Negative for cough, shortness of breath and wheezing.   ?Cardiovascular:  Negative for chest pain, palpitations and leg swelling.  ?Gastrointestinal:  Negative for abdominal pain, constipation, diarrhea, nausea and vomiting.  ?Genitourinary:  Negative for dysuria.  ?Musculoskeletal:  Negative for arthralgias, back pain and myalgias.  ?Skin:  Negative for color change, rash and wound.  ?Neurological:  Negative for dizziness, weakness and headaches.  ?Psychiatric/Behavioral:  Negative for behavioral problems. The patient is not nervous/anxious.    ? ? ? ?Immunization History  ?Administered Date(s) Administered  ? Fluad Quad(high Dose 65+) 07/02/2021  ? Influenza, High Dose Seasonal PF 06/29/2019  ? Influenza-Unspecified 07/12/2020  ? Moderna Sars-Covid-2 Vaccination 10/10/2019, 11/07/2019, 08/28/2020  ? Pneumococcal Polysaccharide-23 07/03/2011  ? Tdap 10/22/2012  ? ?Pertinent  Health Maintenance Due  ?Topic Date Due  ? OPHTHALMOLOGY EXAM  Never done  ? URINE MICROALBUMIN  Never done  ? INFLUENZA VACCINE  04/22/2022  ? FOOT EXAM  Discontinued  ? HEMOGLOBIN A1C  Discontinued  ? DEXA SCAN  Discontinued  ? ? ?  04/20/2019  ?  3:16 PM 07/11/2019  ? 12:35 PM 07/11/2019  ?  6:41 PM 07/12/2019  ?  3:43 AM 07/12/2019  ?  8:13 AM  ?Fall Risk  ?Number of falls in past year - Comments Emmi Telephone Survey Actual Response =       ?Patient Fall Risk Level  High fall risk High fall risk High fall risk High fall risk  ? ? ? ?Vitals:  ? 12/25/21 1424  ?BP: (!) 105/49  ?Pulse: 70  ?Resp: 19  ?Temp: 97.9 ?F (36.6 ?C)  ?Weight: 140 lb  6.4 oz (63.7 kg)  ?Height: 5\' 3"  (1.6 m)  ? ?Body mass index is 24.87 kg/m?. ? ?Physical Exam ?Constitutional:   ?   Appearance: Normal appearance.  ?HENT:  ?   Head: Normocephalic and atraumatic.  ?   Nose: Nose normal.  ?   Mouth/Throat:  ?   Mouth: Mucous membranes are moist.  ?Eyes:  ?   Conjunctiva/sclera: Conjunctivae normal.  ?Cardiovascular:  ?   Rate and Rhythm: Normal rate and regular rhythm.  ?Pulmonary:  ?   Effort: Pulmonary effort is normal.  ?   Breath sounds: Normal breath sounds.  ?Abdominal:  ?   General: Bowel sounds are normal.  ?   Palpations: Abdomen is soft.  ?Musculoskeletal:  ?   Cervical back: Normal range of motion.  ?Skin: ?   General: Skin is warm and dry.  ?Neurological:  ?  Mental Status: She is alert. Mental status is at baseline. She is disoriented.  ?Psychiatric:     ?   Mood and Affect: Mood normal.     ?   Behavior: Behavior normal.  ?  ? ? ? ?Labs reviewed: ?Recent Labs  ?  04/03/21 ?0000 06/12/21 ?0000 09/25/21 ?0000  ?NA 136* 141 144  144  ?K 4.1 4.1 3.9  3.9  ?CL 100 106 107  107  ?CO2 25* 30* 29*  29*  ?BUN 17 15 13  13   ?CREATININE 0.6 0.6 0.6  0.6  ?CALCIUM 8.9 9.4 9.2  9.2  ? ?Recent Labs  ?  01/03/21 ?0000 06/12/21 ?0000  ?AST 22 19  ?ALT 11 12  ?ALKPHOS 103 226*  ?ALBUMIN 3.4* 3.1*  ? ?Recent Labs  ?  05/22/21 ?0000 06/12/21 ?0000 09/25/21 ?0000  ?WBC 3.8 4.2 4.3  4.3  ?HGB 11.5* 12.0 12.2  12.2  ?HCT 33* 34* 38  38  ?PLT 233 232 231  231  ? ?Lab Results  ?Component Value Date  ? TSH 3.20 07/24/2021  ? ?Lab Results  ?Component Value Date  ? HGBA1C 6.6 09/25/2021  ? HGBA1C 6.0 09/25/2021  ? ?Lab Results  ?Component Value Date  ? CHOL 141 01/03/2021  ? HDL 43 01/03/2021  ? LDLCALC 78 01/03/2021  ? TRIG 101 01/03/2021  ? ? ?Significant Diagnostic Results in last 30 days:  ?No results found. ? ?Assessment/Plan ? ?1. Advance care planning ?-    Remains to be DNR ?-    Discussed medications, vital signs and weights ? ?2. Hypothyroidism due to acquired atrophy of  thyroid ?Lab Results  ?Component Value Date  ? TSH 3.20 07/24/2021  ? ?-    Continue levothyroxine ? ?3. Elevated d-dimer ?-    No chest pains ?-   Continue Eliquis ? ?4. Depression, recurrent (HCC) ?-Mood is stable, continu

## 2022-01-09 ENCOUNTER — Non-Acute Institutional Stay (SKILLED_NURSING_FACILITY): Payer: Medicare Other | Admitting: Adult Health

## 2022-01-09 ENCOUNTER — Encounter: Payer: Self-pay | Admitting: Adult Health

## 2022-01-09 DIAGNOSIS — R630 Anorexia: Secondary | ICD-10-CM | POA: Diagnosis not present

## 2022-01-09 DIAGNOSIS — F339 Major depressive disorder, recurrent, unspecified: Secondary | ICD-10-CM | POA: Diagnosis not present

## 2022-01-09 DIAGNOSIS — F0280 Dementia in other diseases classified elsewhere without behavioral disturbance: Secondary | ICD-10-CM

## 2022-01-09 DIAGNOSIS — G301 Alzheimer's disease with late onset: Secondary | ICD-10-CM

## 2022-01-09 DIAGNOSIS — E034 Atrophy of thyroid (acquired): Secondary | ICD-10-CM | POA: Diagnosis not present

## 2022-01-09 DIAGNOSIS — R7989 Other specified abnormal findings of blood chemistry: Secondary | ICD-10-CM

## 2022-01-09 NOTE — Progress Notes (Addendum)
? ?Location:  [facility] ?Nursing Home Room Number: 125A ?Place of Service:  SNF (31) ?Provider:  Kenard Gower, DNP, FNP-BC ? ?Patient Care Team: ?Pecola Lawless, MD as PCP - General (Internal Medicine) ?Medina-Vargas, Margit Banda, NP as Nurse Practitioner (Internal Medicine) ? ?Extended Emergency Contact Information ?Primary Emergency Contact: [name],[name] ?Address: [address] ?         [place], [place] [address] [place] ?Home Phone: [phone] ?Mobile Phone: [phone] ?Relation: Daughter ? ?Code Status:  DNR ? ?Goals of care: Advanced Directive information ? ?  01/09/2022  ?  4:30 PM  ?Advanced Directives  ?Does Patient Have a Medical Advance Directive? Yes  ?Type of Advance Directive Out of facility DNR (pink MOST or yellow form)  ?Does patient want to make changes to medical advance directive? No - Patient declined  ?Pre-existing out of facility DNR order (yellow form or pink MOST form) Yellow form placed in chart (order not valid for inpatient use)  ? ? ? ?Chief Complaint  ?Patient presents with  ? Medical Management of Chronic Issues  ?  Routine follow up  ? ? ?HPI:  ?Pt is a 88 y.o. female seen today for medical management of chronic diseases. She is a long-term care resident of [facility].  ? ?Elevated d-dimer  -  d-dimer 2.35, on Eliquis 2.5 mg BID ? ?Hypothyroidism due to acquired atrophy of thyroid  -  takes Levothyroxine 50 mcg 1 tab daily ? ?Poor appetite  -  takes Mirtazapine 7.5 mg at bedtime ? ?Depression, recurrent (HCC)  -  PHQ-9 score 0, takes Sertraline 50 mg daily ? ?Late onset Alzheimer's dementia without behavioral disturbance, psychotic disturbance, mood disturbance, or anxiety, unspecified dementia severity (HCC) ?-  BIMS score, 4/15, ranging in severe cognitive impairment ? ? ?Past Medical History:  ?Diagnosis Date  ? Cerebrovascular disease   ? Dementia (HCC)   ? Depression   ? DM type 2 (diabetes mellitus, type 2) (HCC)  07/02/2011  ? Elevated LFTs   ? Frequent falls   ? HTN (hypertension) 07/02/2011  ? Thrombocytopenia (HCC)   ? UTI (lower urinary tract infection)   ? ?Past Surgical History:  ?Procedure Laterality Date  ? ABDOMINAL HYSTERECTOMY    ? CHOLECYSTECTOMY    ? ? ?No Known Allergies ? ?Outpatient Encounter Medications as of 01/09/2022  ?Medication Sig  ? acetaminophen (TYLENOL) 325 MG tablet Take 650 mg by mouth every 6 (six) hours as needed.  ? apixaban (ELIQUIS) 2.5 MG TABS tablet Take 2.5 mg by mouth 2 (two) times daily. FOR ELEVATED D-DIMER  ? aspirin EC 81 MG tablet Take 81 mg by mouth daily.  ? bisacodyl (DULCOLAX) 10 MG suppository If not relieved by MOM, give 10 mg Bisacodyl suppositiory rectally X 1 dose in 24 hours as needed (Do not use constipation standing orders for residents with renal failure/CFR less than 30. Contact MD for orders) (Physician Order)  ? Ensure (ENSURE) Take by mouth daily.  ? levothyroxine (SYNTHROID) 50 MCG tablet Take 50 mcg by mouth daily before breakfast.  ? magnesium hydroxide (MILK OF MAGNESIA) 400 MG/5ML suspension If no BM in 3 days, give 30 cc Milk of Magnesium p.o. x 1 dose in 24 hours as needed (Do not use standing constipation orders for residents with renal failure CFR less than 30. Contact MD for orders) (Physician Order)  ? mirtazapine (REMERON) 7.5 MG tablet Take by mouth at bedtime.  ? Multiple Vitamin (MULTIVITAMIN WITH MINERALS) TABS tablet Take  1 tablet by mouth 2 (two) times daily.  ? NON FORMULARY Diet:MECHANICAL SOFT, THIN LIQUIDS  ? Nutritional Supplements (NUTRITIONAL SUPPLEMENT PO) Take by mouth. Magic cup  ? nystatin ointment (MYCOSTATIN) Apply 1 application topically 2 (two) times daily.  ? sertraline (ZOLOFT) 50 MG tablet Take 50 mg by mouth daily.  ? Sodium Phosphates (RA SALINE ENEMA RE) If not relieved by Biscodyl suppository, give disposable Saline Enema rectally X 1 dose/24 hrs as needed (Do not use constipation standing orders for residents with renal  failure/CFR less than 30. Contact MD for orders)(Physician Or  ? ?No facility-administered encounter medications on file as of 01/09/2022.  ? ? ?Review of Systems  ?Constitutional:  Negative for appetite change, chills, fatigue and fever.  ?HENT:  Negative for congestion, hearing loss, rhinorrhea and sore throat.   ?Eyes: Negative.   ?Respiratory:  Negative for cough, shortness of breath and wheezing.   ?Cardiovascular:  Negative for chest pain, palpitations and leg swelling.  ?Gastrointestinal:  Negative for abdominal pain, constipation, diarrhea, nausea and vomiting.  ?Genitourinary:  Negative for dysuria.  ?Musculoskeletal:  Negative for arthralgias, back pain and myalgias.  ?Skin:  Negative for color change, rash and wound.  ?Neurological:  Negative for dizziness, weakness and headaches.  ?Psychiatric/Behavioral:  Negative for behavioral problems. The patient is not nervous/anxious.    ? ? ? ?Immunization History  ?Administered Date(s) Administered  ? Fluad Quad(high Dose 65+) 07/02/2021  ? Influenza, High Dose Seasonal PF 06/29/2019  ? Influenza-Unspecified 07/12/2020  ? Moderna Sars-Covid-2 Vaccination 10/10/2019, 11/07/2019, 08/28/2020  ? Pneumococcal Polysaccharide-23 07/03/2011  ? Tdap 10/22/2012  ? ?Pertinent  Health Maintenance Due  ?Topic Date Due  ? OPHTHALMOLOGY EXAM  Never done  ? URINE MICROALBUMIN  Never done  ? INFLUENZA VACCINE  04/22/2022  ? FOOT EXAM  Discontinued  ? HEMOGLOBIN A1C  Discontinued  ? DEXA SCAN  Discontinued  ? ? ?  04/20/2019  ?  3:16 PM 07/11/2019  ? 12:35 PM 07/11/2019  ?  6:41 PM 07/12/2019  ?  3:43 AM 07/12/2019  ?  8:13 AM  ?Fall Risk  ?Number of falls in past year - Comments Emmi Telephone Survey Actual Response =       ?Patient Fall Risk Level  High fall risk High fall risk High fall risk High fall risk  ? ? ? ?Vitals:  ? 01/09/22 1625  ?BP: (!) 101/49  ?Pulse: (!) 58  ?Resp: 18  ?Temp: 97.6 ?F (36.4 ?C)  ?Height: 5\' 3"  (1.6 m)  ? ?Body mass index is 24.87 kg/m?. ? ?Physical  Exam ?Constitutional:   ?   General: She is not in acute distress. ?   Appearance: Normal appearance.  ?HENT:  ?   Head: Normocephalic and atraumatic.  ?   Nose: Nose normal.  ?   Mouth/Throat:  ?   Mouth: Mucous membranes are moist.  ?Eyes:  ?   Conjunctiva/sclera: Conjunctivae normal.  ?Cardiovascular:  ?   Rate and Rhythm: Normal rate and regular rhythm.  ?Pulmonary:  ?   Effort: Pulmonary effort is normal.  ?   Breath sounds: Normal breath sounds.  ?Abdominal:  ?   General: Bowel sounds are normal.  ?   Palpations: Abdomen is soft.  ?Musculoskeletal:     ?   General: Normal range of motion.  ?   Cervical back: Normal range of motion.  ?Skin: ?   General: Skin is warm and dry.  ?Neurological:  ?   General: No focal deficit present.  ?  Mental Status: She is alert. She is disoriented.  ?Psychiatric:     ?   Mood and Affect: Mood normal.     ?   Behavior: Behavior normal.  ?  ? ? ?Labs reviewed: ?Recent Labs  ?  04/03/21 ?0000 06/12/21 ?0000 09/25/21 ?0000  ?NA 136* 141 144  144  ?K 4.1 4.1 3.9  3.9  ?CL 100 106 107  107  ?CO2 25* 30* 29*  29*  ?BUN 17 15 13  13   ?CREATININE 0.6 0.6 0.6  0.6  ?CALCIUM 8.9 9.4 9.2  9.2  ? ?Recent Labs  ?  06/12/21 ?0000  ?AST 19  ?ALT 12  ?ALKPHOS 226*  ?ALBUMIN 3.1*  ? ?Recent Labs  ?  05/22/21 ?0000 06/12/21 ?0000 09/25/21 ?0000  ?WBC 3.8 4.2 4.3  4.3  ?HGB 11.5* 12.0 12.2  12.2  ?HCT 33* 34* 38  38  ?PLT 233 232 231  231  ? ?Lab Results  ?Component Value Date  ? TSH 3.20 07/24/2021  ? ?Lab Results  ?Component Value Date  ? HGBA1C 6.6 09/25/2021  ? HGBA1C 6.0 09/25/2021  ? ?Lab Results  ?Component Value Date  ? CHOL 141 01/03/2021  ? HDL 43 01/03/2021  ? LDLCALC 78 01/03/2021  ? TRIG 101 01/03/2021  ? ? ?Significant Diagnostic Results in last 30 days:  ?No results found. ? ?Assessment/Plan ? ?1. Elevated d-dimer ?-  D- dimer remains elevated, continue Eliquis ? ?2. Hypothyroidism due to acquired atrophy of thyroid ?Lab Results  ?Component Value Date  ? TSH 3.20  07/24/2021  ? ?-  stable, continue Levothyroxine ? ?3. Poor appetite ?Wt Readings from Last 3 Encounters:  ?12/25/21 140 lb 6.4 oz (63.7 kg)  ?11/28/21 135 lb 6.4 oz (61.4 kg)  ?10/28/21 137 lb 12.8 oz (62.5 kg)  ? ?-  gained weigh

## 2022-01-23 ENCOUNTER — Non-Acute Institutional Stay (SKILLED_NURSING_FACILITY): Payer: Medicare Other | Admitting: Internal Medicine

## 2022-01-23 ENCOUNTER — Encounter: Payer: Self-pay | Admitting: Internal Medicine

## 2022-01-23 DIAGNOSIS — E034 Atrophy of thyroid (acquired): Secondary | ICD-10-CM | POA: Diagnosis not present

## 2022-01-23 DIAGNOSIS — N182 Chronic kidney disease, stage 2 (mild): Secondary | ICD-10-CM | POA: Diagnosis not present

## 2022-01-23 DIAGNOSIS — G309 Alzheimer's disease, unspecified: Secondary | ICD-10-CM

## 2022-01-23 DIAGNOSIS — E1151 Type 2 diabetes mellitus with diabetic peripheral angiopathy without gangrene: Secondary | ICD-10-CM | POA: Diagnosis not present

## 2022-01-23 DIAGNOSIS — R7989 Other specified abnormal findings of blood chemistry: Secondary | ICD-10-CM

## 2022-01-23 DIAGNOSIS — F02C0 Dementia in other diseases classified elsewhere, severe, without behavioral disturbance, psychotic disturbance, mood disturbance, and anxiety: Secondary | ICD-10-CM

## 2022-01-23 DIAGNOSIS — D696 Thrombocytopenia, unspecified: Secondary | ICD-10-CM

## 2022-01-23 NOTE — Assessment & Plan Note (Signed)
01/22/2022 platelet count 182,000, thrombocytopenia resolved.  ?

## 2022-01-23 NOTE — Assessment & Plan Note (Signed)
01/22/2022 A1c 7% indicating excellent control in this 88 year old with multiple advanced comorbidities.  No change indicated. ?

## 2022-01-23 NOTE — Assessment & Plan Note (Signed)
01/22/2022 D-dimer 2.22.  Eliquis will be continued and D-dimer monitored periodically. ?

## 2022-01-23 NOTE — Patient Instructions (Signed)
See assessment and plan under each diagnosis in the problem list and acutely for this visit 

## 2022-01-23 NOTE — Assessment & Plan Note (Signed)
02/08/2022 creatinine 0.59 with a GFR of 87 indicating high stage II CKD.  No change in medicine regimen indicated. ?

## 2022-01-23 NOTE — Progress Notes (Signed)
? ?NURSING HOME LOCATION:  [facility] ?ROOM NUMBER:  125 A ? ?CODE STATUS:  DNR ? ?PCP:  Durenda Age NP,PSC ? ?This is a nursing facility follow up visit of chronic medical diagnoses & to document compliance with Regulation 483.30 (c) in [employer] Phase [employer] which mandates caregiver visit ( visits can alternate among physician, PA or NP as per statutes) within 10 days of 30 days / 60 days/ 90 days post admission to SNF date   ? ?Interim medical record and care since last SNF visit was updated with review of diagnostic studies and change in clinical status since last visit were documented. ? ?HPI: She is a permanent resident of this facility with diagnoses of diabetes with peripheral vascular disease, essential hypertension, cerebrovascular disease, and dementia. ?She has been on Eliquis since August 2022 because of elevated D-dimer.  This was performed because of a complaint of chest pain.  The initial value was 2.06.  Troponin was less than 0.3.  EKG was negative for any ACS.  Since that time the D-dimer has remained elevated and the Eliquis has been continued.  The most recent D-dimer was 2.22 on 5/3.  Also at that time she exhibited an essentially normochromic, normocytic anemia with H/H of 11.9/34.5. MCV was insignificantly elevated at 95.7.  Platelet count was 182,000.  TSH was therapeutic at 3.17.  Chemistries revealed albumin of 3.2 and total protein of 5.3 indicating protein/caloric malnutrition.  Creatinine was 0.59 with a GFR of 87 indicating high stage II CKD.  A1c was 7 indicating excellent control. ? ?Review of systems: Dementia invalidated responses.  Today she denies having any chest pain. ?Her roommate states that she has multiple intermittent complaints but is not consistent.  Roommate made the statement that she thinks it is "to get attention".  She states the most consistent complaint by Jennifer Kerr is leg pain.  Again she denies such  today. ? ?Physical exam:  ?Pertinent or positive findings: She appears her age and chronically ill and malnourished.  Hair is disheveled.  There is intermittent esotropia of the right eye.  She has bilateral ptosis.  She is completely edentulous.  Heart sounds are markedly decreased as are her breath sounds.  Abdomen is doughy and nontender.  Pedal pulses are decreased but dorsalis pedis pulses are stronger than posterior tibial pulses.  There is marked atrophy of the limbs and interosseous wasting of the hands.  As I was examining her legs I found the stuffed kitten which she usually is cradling under the bed linens.  She made the comment "I wonder ed where he went". ? ?General appearance: no acute distress, increased work of breathing is present.   ?Lymphatic: No lymphadenopathy about the head, neck, axilla. ?Eyes: No conjunctival inflammation or lid edema is present. There is no scleral icterus. ?Ears:  External ear exam shows no significant lesions or deformities.   ?Nose:  External nasal examination shows no deformity or inflammation. Nasal mucosa are pink and moist without lesions, exudates ?Neck:  No thyromegaly, masses, tenderness noted.    ?Heart:  No gallop, murmur, click, rub .  ?Lungs: w/o wheezes, rhonchi, rales, rubs. ?Abdomen: Bowel sounds are normal. Abdomen is soft and nontender with no organomegaly, hernias, masses. ?GU: Deferred  ?Extremities:  No cyanosis, clubbing, edema  ?Neurologic exam :Balance, Rhomberg, finger to nose testing could not be completed due to clinical state ?Skin: Warm & dry w/o tenting. ?No significant lesions or rash. ? ?  See summary under each active problem in the Problem List with associated updated therapeutic plan ? ? ?

## 2022-01-23 NOTE — Assessment & Plan Note (Signed)
01/22/2022 TSH therapeutic at 3.17.  No change indicated. ?

## 2022-01-24 NOTE — Assessment & Plan Note (Signed)
Severe dementia w/o behavioral issues as per staff ?

## 2022-08-21 ENCOUNTER — Emergency Department (HOSPITAL_COMMUNITY): Payer: Medicare Other

## 2022-08-21 ENCOUNTER — Emergency Department (HOSPITAL_COMMUNITY)
Admission: EM | Admit: 2022-08-21 | Discharge: 2022-08-21 | Disposition: A | Discharge status: Skilled Nursing Facility | Payer: Medicare Other | Attending: Emergency Medicine | Admitting: Emergency Medicine

## 2022-08-21 DIAGNOSIS — S82101A Unspecified fracture of upper end of right tibia, initial encounter for closed fracture: Secondary | ICD-10-CM | POA: Diagnosis not present

## 2022-08-21 DIAGNOSIS — X58XXXA Exposure to other specified factors, initial encounter: Secondary | ICD-10-CM | POA: Diagnosis not present

## 2022-08-21 DIAGNOSIS — Z7982 Long term (current) use of aspirin: Secondary | ICD-10-CM | POA: Diagnosis not present

## 2022-08-21 DIAGNOSIS — Z79899 Other long term (current) drug therapy: Secondary | ICD-10-CM | POA: Diagnosis not present

## 2022-08-21 DIAGNOSIS — S8991XA Unspecified injury of right lower leg, initial encounter: Secondary | ICD-10-CM | POA: Diagnosis present

## 2022-08-21 DIAGNOSIS — M79604 Pain in right leg: Secondary | ICD-10-CM | POA: Diagnosis not present

## 2022-08-21 DIAGNOSIS — Z7901 Long term (current) use of anticoagulants: Secondary | ICD-10-CM | POA: Insufficient documentation

## 2022-08-21 DIAGNOSIS — S82831A Other fracture of upper and lower end of right fibula, initial encounter for closed fracture: Secondary | ICD-10-CM | POA: Diagnosis not present

## 2022-08-21 MED ORDER — FOOD THICKENER (SIMPLYTHICK): 1.0000 | ORAL | Status: DC | PRN

## 2022-08-21 NOTE — ED Provider Notes (Signed)
[facility] EMERGENCY DEPARTMENT Provider Note   CSN: [id number] Arrival date & time: 08/21/22  1132     History  Chief Complaint  Patient presents with   Leg Injury   Leg Pain    Jennifer Kerr is a 88 y.o. female.  The history is provided by the EMS personnel and medical records. The history is limited by the condition of the patient. No language interpreter was used.  Leg Pain Location:  Leg Time since incident:  2 days Injury: yes   Leg location:  R lower leg Pain details:    Quality:  Unable to specify   Severity:  Unable to specify   Onset quality:  Unable to specify   Timing:  Unable to specify   Progression:  Unable to specify Chronicity:  New Prior injury to area:  Unable to specify Associated symptoms: no back pain, no fatigue and no fever   Risk factors: no recent illness        Home Medications Prior to Admission medications   Medication Sig Start Date End Date Taking? Authorizing Provider  acetaminophen (TYLENOL) 325 MG tablet Take 650 mg by mouth every 6 (six) hours as needed.    [provider]  apixaban (ELIQUIS) 2.5 MG TABS tablet Take 2.5 mg by mouth 2 (two) times daily. FOR ELEVATED D-DIMER    [provider]  aspirin EC 81 MG tablet Take 81 mg by mouth daily.    [provider]  bisacodyl (DULCOLAX) 10 MG suppository If not relieved by MOM, give 10 mg Bisacodyl suppositiory rectally X 1 dose in 24 hours as needed (Do not use constipation standing orders for residents with renal failure/CFR less than 30. Contact MD for orders) (Physician Order)    [provider]  Ensure (ENSURE) Take by mouth daily.    [provider]  levothyroxine (SYNTHROID) 50 MCG tablet Take 50 mcg by mouth daily before breakfast.    [provider]  magnesium hydroxide (MILK OF MAGNESIA) 400 MG/5ML suspension If no BM in 3 days, give 30 cc Milk of Magnesium p.o. x 1 dose in 24 hours as needed (Do not use  standing constipation orders for residents with renal failure CFR less than 30. Contact MD for orders) (Physician Order)    [provider]  mirtazapine (REMERON) 7.5 MG tablet Take by mouth at bedtime.    [provider]  Multiple Vitamin (MULTIVITAMIN WITH MINERALS) TABS tablet Take 1 tablet by mouth 2 (two) times daily.    [provider]  NON FORMULARY Diet:MECHANICAL SOFT, THIN LIQUIDS      Nutritional Supplements (NUTRITIONAL SUPPLEMENT PO) Take by mouth. Magic cup    [provider]  nystatin ointment (MYCOSTATIN) Apply 1 application topically 2 (two) times daily.    [provider]  sertraline (ZOLOFT) 50 MG tablet Take 50 mg by mouth daily.    [provider]  Sodium Phosphates (RA SALINE ENEMA RE) If not relieved by Biscodyl suppository, give disposable Saline Enema rectally X 1 dose/24 hrs as needed (Do not use constipation standing orders for residents with renal failure/CFR less than 30. Contact MD for orders)(Physician Or    [provider]      Allergies    Patient has no known allergies.    Review of Systems   Review of Systems  Unable to perform ROS: Dementia (Patient has severe dementia but was denying other complaints aside from the pain in her knee when it  was pushed.)  Constitutional:  Negative for fatigue and fever.  Respiratory:  Negative for cough.   Cardiovascular:  Negative for chest pain.  Gastrointestinal:  Negative for abdominal pain.  Musculoskeletal:  Negative for back pain.  Neurological:  Negative for headaches.    Physical Exam Updated Vital Signs BP 111/74   Pulse 72   Temp 98.5 F (36.9 C) (Axillary)   Resp 15   SpO2 97%  Physical Exam Vitals and nursing note reviewed.  Constitutional:      General: She is not in acute distress.    Appearance: She is well-developed. She is not ill-appearing, toxic-appearing or diaphoretic.  HENT:     Head: Normocephalic and atraumatic.      Mouth/Throat:     Mouth: Mucous membranes are moist.  Eyes:     Conjunctiva/sclera: Conjunctivae normal.  Cardiovascular:     Heart sounds: No murmur heard. Pulmonary:     Effort: Pulmonary effort is normal. No respiratory distress.     Breath sounds: Normal breath sounds. No wheezing, rhonchi or rales.  Chest:     Chest wall: No tenderness.  Abdominal:     Palpations: Abdomen is soft.     Tenderness: There is no abdominal tenderness. There is no guarding or rebound.  Musculoskeletal:        General: Tenderness present. No swelling.     Cervical back: Neck supple.     Right knee: Tenderness present.     Right lower leg: Tenderness present. No edema.     Left lower leg: No edema.       Legs:     Comments: Patient could feel sensation, can wiggle her toes, and had intact pulses distally.  Tenderness in the right knee and proximal tibial area.  Skin:    General: Skin is warm and dry.     Capillary Refill: Capillary refill takes less than 2 seconds.     Findings: No erythema or rash.  Neurological:     General: No focal deficit present.     Mental Status: She is alert.     Sensory: No sensory deficit.     Motor: No weakness.     ED Results / Procedures / Treatments   Labs (all labs ordered are listed, but only abnormal results are displayed) Labs Reviewed - No data to display  EKG None  Radiology DG Knee Complete 4 Views Right  Result Date: 08/21/2022 CLINICAL DATA:  Leg injury. EXAM: RIGHT KNEE - COMPLETE 4+ VIEW; RIGHT TIBIA AND FIBULA - 2 VIEW COMPARISON:  None Available. FINDINGS: The bones are diffusely demineralized. There is an acute mildly displaced fracture through the proximal tibial metaphysis. This fracture demonstrates no definite involvement of either tibial plateau. There is a nondisplaced fracture of the fibular neck. The distal femur and patella appear intact. No acute injuries are identified in the lower leg. There is diffuse soft tissue swelling in the  proximal lower leg with a moderate size knee joint effusion. IMPRESSION: 1. Mildly displaced fracture of the proximal tibial metaphysis and nondisplaced fracture of the fibular neck. No definite intra-articular extension of the fractures is identified, although assessment is limited by osteopenia and positioning. 2. Diffuse soft tissue swelling and knee joint effusion. Electronically Signed   By: Carey BullocksWilliam  Veazey M.D.   On: 08/21/2022 12:21   DG Tibia/Fibula Right  Result Date: 08/21/2022 CLINICAL DATA:  Leg injury. EXAM: RIGHT KNEE - COMPLETE 4+ VIEW; RIGHT TIBIA AND FIBULA - 2 VIEW COMPARISON:  None  Available. FINDINGS: The bones are diffusely demineralized. There is an acute mildly displaced fracture through the proximal tibial metaphysis. This fracture demonstrates no definite involvement of either tibial plateau. There is a nondisplaced fracture of the fibular neck. The distal femur and patella appear intact. No acute injuries are identified in the lower leg. There is diffuse soft tissue swelling in the proximal lower leg with a moderate size knee joint effusion. IMPRESSION: 1. Mildly displaced fracture of the proximal tibial metaphysis and nondisplaced fracture of the fibular neck. No definite intra-articular extension of the fractures is identified, although assessment is limited by osteopenia and positioning. 2. Diffuse soft tissue swelling and knee joint effusion. Electronically Signed   By: Carey Bullocks M.D.   On: 08/21/2022 12:21    Procedures Procedures    Medications Ordered in ED Medications - No data to display  ED Course/ Medical Decision Making/ A&P                           Medical Decision Making Amount and/or Complexity of Data Reviewed Radiology: ordered.    ABIGAYLE WILINSKI is a 88 y.o. female with a past medical history significant for dementia, hypertension, cerebral vascular disease, depression, previous cholecystectomy, previous hysterectomy, and lives at a facility who  presents for right leg injury.  According to EMS report, patient was being transferred to her bed yesterday when there was an alleged injury during this maneuver.  EMS reports there was no fall or head injury and she otherwise has been acting at her mental status baseline.  The facility reportedly did an x-ray that we cannot see that showed a tib-fib fracture.  They initially wrapped it up for stability but due to her complaining of more pain today she is sent in for evaluation.  EMS reports she is neurovascularly intact and is mentally at her baseline.  On exam, lungs clear and chest nontender.  Abdomen nontender.  She was able to wiggle both toes and feet and has intact pulses.  She had tenderness in the knee and proximal shin.  There is some [place] wrapping it although EMS reports there was no laceration or skin injury.  Will get x-rays to see the extent of her injury and discussed the plan with orthopedics.  As she is reportedly abdomen is as baseline, not hit her head, and otherwise not having complaints aside from the leg pain, will hold on more extensive workup initially.  Patient arrives with her DO NOT RESUSCITATE paperwork which will stay with her.  Anticipate follow-up after imaging completed.  Patient's x-ray does indeed show tibial and fibular fractures.  Orthopedics was called to come see patient to help determine disposition.  1:34 PM Spoke to orthopedics who feel that as she is nonambulatory at baseline, she is appropriate knee immobilizer and follow-up in outpatient clinic with orthopedics.  Will discuss this with patient and anticipate discharge back to her facility.  Knee immobilizer placed and patient will be discharged back to facility.        Final Clinical Impression(s) / ED Diagnoses Final diagnoses:  Closed fracture of proximal end of right tibia, unspecified fracture morphology, initial encounter  Closed fracture of proximal end of right fibula, unspecified fracture  morphology, initial encounter    Rx / DC Orders ED Discharge Orders     None       Clinical Impression: 1. Closed fracture of proximal end of right tibia, unspecified fracture morphology, initial encounter  2. Closed fracture of proximal end of right fibula, unspecified fracture morphology, initial encounter     Disposition: Discharge  Condition: Good  I have discussed the results, Dx and Tx plan with the pt(& family if present). He/she/they expressed understanding and agree(s) with the plan. Discharge instructions discussed at great length. Strict return precautions discussed and pt &/or family have verbalized understanding of the instructions. No further questions at time of discharge.    New Prescriptions   No medications on file    Follow Up: Joen Laura, MD [address] [place] [address] [phone]     Marinda Elk, MD [address] [place] [address] [phone]     [facility] EMERGENCY DEPARTMENT [address] [id number] [place] [address] [phone]        Osmin Welz, Canary Brim, MD 08/21/22 [phone]

## 2022-08-21 NOTE — ED Notes (Signed)
Per Neysa Bonito at [facility] pt requires nectar thick liquids with straw and is on a puree diet.

## 2022-08-21 NOTE — ED Notes (Signed)
Report given and care endorsed to [facility]. PT RLE circulation and sensation WNL

## 2022-08-21 NOTE — Discharge Instructions (Signed)
Your history, exam, imaging today confirmed tibial and fibular fractures.  I spoke to orthopedics who feel you are appropriate for a knee immobilizer and follow-up in clinic with orthopedics.  They did not feel he needs surgery at this time.  As you had intact sensation, strength, and pulses distally, we will discharge back to your facility.  Please rest and stay hydrated and follow-up with both orthopedics and your primary team.

## 2022-08-21 NOTE — ED Triage Notes (Signed)
PT BIB [facility]. Per EMS report pt injured right leg at [facility] yesterday afternoon. Pt arrived with right leg splinted upon arrival. Pt hx of dementia a&O x2 Pt brought in to rule out surgical fixation of fracture.

## 2022-08-21 NOTE — Consult Note (Signed)
Reason for Consult:Right tib/fib fx Referring Physician: Thayer Ohm Tegeler Time called: 1236 Time at bedside: 1323   Jennifer Kerr is an 88 y.o. female.  HPI: Jennifer Kerr had her leg banged into a piece of furniture at the SNF where she resides during a transfer. The facility did an x-ray which showed a tib/fib fx and they planned to treat conservatively. She c/o more pain today so they sent her to the ED and orthopedic surgery was consulted. She is moderately demented and cannot contribute meaningfully to history. She does not ambulate.  Past Medical History:  Diagnosis Date   Cerebrovascular disease    Dementia (HCC)    Depression    DM type 2 (diabetes mellitus, type 2) (HCC) 07/02/2011   Elevated LFTs    Frequent falls    HTN (hypertension) 07/02/2011   Thrombocytopenia (HCC)    UTI (lower urinary tract infection)     Past Surgical History:  Procedure Laterality Date   ABDOMINAL HYSTERECTOMY     CHOLECYSTECTOMY      Family History  Problem Relation Age of Onset   Cancer Mother    Heart disease Father     Social History:  reports that she has never smoked. She has never used smokeless tobacco. She reports that she does not drink alcohol and does not use drugs.  Allergies: No Known Allergies  Medications: I have reviewed the patient's current medications.  No results found for this or any previous visit (from the past 48 hour(s)).  DG Knee Complete 4 Views Right  Result Date: 08/21/2022 CLINICAL DATA:  Leg injury. EXAM: RIGHT KNEE - COMPLETE 4+ VIEW; RIGHT TIBIA AND FIBULA - 2 VIEW COMPARISON:  None Available. FINDINGS: The bones are diffusely demineralized. There is an acute mildly displaced fracture through the proximal tibial metaphysis. This fracture demonstrates no definite involvement of either tibial plateau. There is a nondisplaced fracture of the fibular neck. The distal femur and patella appear intact. No acute injuries are identified in the lower leg. There is diffuse  soft tissue swelling in the proximal lower leg with a moderate size knee joint effusion. IMPRESSION: 1. Mildly displaced fracture of the proximal tibial metaphysis and nondisplaced fracture of the fibular neck. No definite intra-articular extension of the fractures is identified, although assessment is limited by osteopenia and positioning. 2. Diffuse soft tissue swelling and knee joint effusion. Electronically Signed   By: Carey Bullocks M.D.   On: 08/21/2022 12:21   DG Tibia/Fibula Right  Result Date: 08/21/2022 CLINICAL DATA:  Leg injury. EXAM: RIGHT KNEE - COMPLETE 4+ VIEW; RIGHT TIBIA AND FIBULA - 2 VIEW COMPARISON:  None Available. FINDINGS: The bones are diffusely demineralized. There is an acute mildly displaced fracture through the proximal tibial metaphysis. This fracture demonstrates no definite involvement of either tibial plateau. There is a nondisplaced fracture of the fibular neck. The distal femur and patella appear intact. No acute injuries are identified in the lower leg. There is diffuse soft tissue swelling in the proximal lower leg with a moderate size knee joint effusion. IMPRESSION: 1. Mildly displaced fracture of the proximal tibial metaphysis and nondisplaced fracture of the fibular neck. No definite intra-articular extension of the fractures is identified, although assessment is limited by osteopenia and positioning. 2. Diffuse soft tissue swelling and knee joint effusion. Electronically Signed   By: Carey Bullocks M.D.   On: 08/21/2022 12:21    Review of Systems  Unable to perform ROS: Dementia   Blood pressure 111/74, pulse 72, temperature  98.5 F (36.9 C), temperature source Axillary, resp. rate 15, height 5\' 3"  (1.6 m), weight 63.7 kg, SpO2 97 %. Physical Exam Constitutional:      General: She is not in acute distress.    Appearance: She is well-developed. She is not diaphoretic.  HENT:     Head: Normocephalic and atraumatic.  Eyes:     General: No scleral icterus.        Right eye: No discharge.        Left eye: No discharge.     Conjunctiva/sclera: Conjunctivae normal.  Cardiovascular:     Rate and Rhythm: Normal rate and regular rhythm.  Pulmonary:     Effort: Pulmonary effort is normal. No respiratory distress.  Musculoskeletal:     Cervical back: Normal range of motion.     Comments: RLE No traumatic wounds, ecchymosis, or rash  Knee wrapped in ACE, compartments soft  No ankle effusion  Sens DPN, SPN, TN could not assess  Motor EHL, ext, flex, evers could not assess  DP 1+, PT 0, No significant edema  Skin:    General: Skin is warm and dry.  Neurological:     Mental Status: She is alert.  Psychiatric:        Mood and Affect: Mood normal.        Behavior: Behavior normal.     Assessment/Plan: Right tib/fib fx -- Will give KI which should help with pain. F/u with Dr. in 2-3 weeks.    Blanchie Dessert, PA-C Orthopedic Surgery [phone] 08/21/2022, 1:28 PM

## 2022-08-21 NOTE — Progress Notes (Signed)
Orthopedic Tech Progress Note Patient Details:  Jennifer Kerr 1931/11/25 [id number]  Knee immobilizer placed on RLE following webril padding and ace wrap.   Ortho Devices Type of Ortho Device: Ace wrap, Cotton web roll, Knee Immobilizer Ortho Device/Splint Location: RLE Ortho Device/Splint Interventions: Ordered, Application, Adjustment   Post Interventions Patient Tolerated: Fair Instructions Provided: Care of device  Gustavo Meditz Carmine Savoy 08/21/2022, 2:40 PM

## 2022-08-21 NOTE — ED Notes (Signed)
Patient transported to X-ray
